# Patient Record
Sex: Male | Born: 1950 | Race: Black or African American | Hispanic: No | Smoking: Former smoker
Health system: Southern US, Community
[De-identification: ages and names within clinical notes are randomized; demographics above are authoritative.]

## PROBLEM LIST (undated history)

## (undated) DIAGNOSIS — E119 Type 2 diabetes mellitus without complications: Secondary | ICD-10-CM

## (undated) DIAGNOSIS — E785 Hyperlipidemia, unspecified: Secondary | ICD-10-CM

## (undated) DIAGNOSIS — I739 Peripheral vascular disease, unspecified: Secondary | ICD-10-CM

## (undated) DIAGNOSIS — R51 Headache: Secondary | ICD-10-CM

## (undated) DIAGNOSIS — K219 Gastro-esophageal reflux disease without esophagitis: Secondary | ICD-10-CM

## (undated) DIAGNOSIS — I10 Essential (primary) hypertension: Secondary | ICD-10-CM

## (undated) DIAGNOSIS — R519 Headache, unspecified: Secondary | ICD-10-CM

## (undated) DIAGNOSIS — M1990 Unspecified osteoarthritis, unspecified site: Secondary | ICD-10-CM

## (undated) HISTORY — DX: Essential (primary) hypertension: I10

## (undated) HISTORY — DX: Hyperlipidemia, unspecified: E78.5

## (undated) HISTORY — PX: BACK SURGERY: SHX140

## (undated) HISTORY — PX: KNEE ARTHROSCOPY: SUR90

---
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
## 1995-09-07 HISTORY — PX: COLONOSCOPY: SHX5424

## 1999-09-07 HISTORY — PX: NECK SURGERY: SHX720

## 2000-02-10 ENCOUNTER — Ambulatory Visit (HOSPITAL_COMMUNITY): Admission: RE | Admit: 2000-02-10 | Discharge: 2000-02-10 | Payer: Self-pay | Admitting: Neurological Surgery

## 2000-02-10 ENCOUNTER — Encounter: Payer: Self-pay | Admitting: Neurological Surgery

## 2000-05-20 ENCOUNTER — Encounter: Payer: Self-pay | Admitting: Neurological Surgery

## 2000-05-24 ENCOUNTER — Encounter: Payer: Self-pay | Admitting: Neurological Surgery

## 2000-05-24 ENCOUNTER — Inpatient Hospital Stay (HOSPITAL_COMMUNITY): Admission: RE | Admit: 2000-05-24 | Discharge: 2000-05-26 | Payer: Self-pay | Admitting: Neurological Surgery

## 2000-06-08 ENCOUNTER — Encounter: Payer: Self-pay | Admitting: *Deleted

## 2000-06-08 ENCOUNTER — Emergency Department (HOSPITAL_COMMUNITY): Admission: EM | Admit: 2000-06-08 | Discharge: 2000-06-08 | Payer: Self-pay | Admitting: Emergency Medicine

## 2000-07-08 ENCOUNTER — Encounter: Payer: Self-pay | Admitting: Neurological Surgery

## 2000-07-08 ENCOUNTER — Encounter: Admission: RE | Admit: 2000-07-08 | Discharge: 2000-07-08 | Payer: Self-pay | Admitting: Neurological Surgery

## 2000-11-25 ENCOUNTER — Encounter: Admission: RE | Admit: 2000-11-25 | Discharge: 2000-11-25 | Payer: Self-pay | Admitting: Neurological Surgery

## 2000-11-25 ENCOUNTER — Encounter: Payer: Self-pay | Admitting: Neurological Surgery

## 2001-02-17 ENCOUNTER — Encounter: Payer: Self-pay | Admitting: Neurological Surgery

## 2001-02-17 ENCOUNTER — Ambulatory Visit (HOSPITAL_COMMUNITY): Admission: RE | Admit: 2001-02-17 | Discharge: 2001-02-17 | Payer: Self-pay | Admitting: Neurological Surgery

## 2004-06-11 ENCOUNTER — Ambulatory Visit (HOSPITAL_COMMUNITY): Admission: RE | Admit: 2004-06-11 | Discharge: 2004-06-11 | Payer: Self-pay | Admitting: Neurological Surgery

## 2007-04-12 ENCOUNTER — Encounter: Admission: RE | Admit: 2007-04-12 | Discharge: 2007-04-12 | Payer: Self-pay | Admitting: Neurological Surgery

## 2011-01-22 NOTE — Op Note (Signed)
[place]. [facility]  Patient:    [name] [name], [name] [name]                         MRN: [id number] Proc. Date: 05/24/00 Adm. 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  POSTOPERATIVE DIAGNOSIS:  Cervical spondylosis with myelopathy, C3 to T1.  OPERATION PERFORMED:  Cervical laminoplasty, C3, C4, C5, C6, C6 and T1. Arthrodesis with allograft and Osteomed plate fixation C3, C4, C5, C6, C7 and T1.  SURGEON:  Stefani Dama, M.D.  ASSISTANT:   Mena Goes. Franky Macho, M.D.  ANESTHESIA:  General endotracheal.  INDICATIONS FOR PROCEDURE:  The patient is a 60 year old individual who has had significant complaints of neck, shoulder and arm pain with progressive weakness, dysesthesias in the lower extremity.  He has a previous known history of lumbar stenosis.  He was also found to have cervical stenosis and spondylosis and after following him for a numer of years with progressive but insidious worsening, he was advised regarding surgical decompression via cervical laminoplasty.  DESCRIPTION OF PROCEDURE:  The patient was brought to the operating room supine on the stretcher. After smooth induction of general endotracheal anesthesia, he was placed in a three-pin head rest, turned prone and stabilized on the table with appropriate protection to the bony prominences. The back of the neck was then shaved, prepped with DuraPrep and then draped in sterile fashion.  A midline incision was then created and carried out from the base of the occiput down to the T1 region.  The dissection was carried down through the cervical dorsal fascia and the fascia was then opened and separated so as to allow division of the paraspinous musculature from the spinous processes of C3 all the way down to the T1 vertebra.  The lateral gutters were packed and there was noted to be  significant vascularity of the musculature in either lateral gutter thus causing some blood loss.  The dissection was carried out to the facet joints which were easily identified. Then using a Midas Rex and an A-2 bur, a cut was made through the lateral border of the lamina on the right side down to the inner table of the bone but not through it.  This was done quite straightforwardly at T1, C7, C6, and C5; however, at C4 and C3 the lamina was so thin that it was cut through.  On the right side then, a similar cut was made through both cortices of the bone. this allowed for elevation of one side of the laminar arch thus forming the trap opening and relieving the spinal canal compromise.  At the C3 and the C4 levels, the lamina was elevated completely.  A patellar allograft was then cut into appropriate pieces measuring approximately 3 mm in width, 4 mm in heighit and 10 mm in length.  These pieces of allograft were then fashioned and fitted into the laminar opening on the left hand side.  Each graft required specific tailoring to fit so that would open the lamina in adequate amount.  At C3 and C4, bilateral grafts were placed, so as to lift the lamina completely dorsally, allowing good decompression of the central canal.  Then 6-hole Osteomed plates were placed on each of the laminar  bridges with the graft sandwiched in between.  These were secured with four 4 mm screws in each plate.  This was done on the left side at C3, C4, C5, C6, C7 and T1 and then on the right side at C3 and C4 using only two holed Osteomed plates.  Two 5 mm screws were placed in some of the thicker portions of bone at C7 and T1.  The total number of plates used was six 6-hole Osteomed plates, two 2-hole Osteomed plates, 26 4mm screws and two 5mm screws. Once the construct was completed it was checked for security at each of the individual cervical levels.  This was felt to be stable and intact.  The area was  copiously irrigated with antibiotic irrigating solution.  Then the cervical dorsal fascia was reapproximated and closed with #1 Vicryl in interrupted fashion. 2-0 Vicryl was used in the subcutaneous and subcuticular tissues.  Surgical staples were used in the skin.  A dry sterile dressing was applied.  The patient was then turned to the supine position.  He was extubated in the operating room and returned to the recovery room for further postoperative care.  ESTIMATED BLOOD LOSS:  700 cc. DD:  05/24/00 TD:  05/25/00 Job: [id number]

## 2013-02-07 ENCOUNTER — Other Ambulatory Visit: Payer: Self-pay | Admitting: Neurological Surgery

## 2013-02-07 DIAGNOSIS — M48061 Spinal stenosis, lumbar region without neurogenic claudication: Secondary | ICD-10-CM

## 2013-02-14 ENCOUNTER — Ambulatory Visit
Admission: RE | Admit: 2013-02-14 | Discharge: 2013-02-14 | Disposition: A | Discharge status: Home / Self Care | Payer: No Typology Code available for payment source | Source: Ambulatory Visit | Attending: Neurological Surgery | Admitting: Neurological Surgery

## 2013-02-14 DIAGNOSIS — M48061 Spinal stenosis, lumbar region without neurogenic claudication: Secondary | ICD-10-CM

## 2013-02-14 MED ORDER — GADOBENATE DIMEGLUMINE 529 MG/ML IV SOLN
20.0000 mL | Freq: Once | INTRAVENOUS | Status: AC | PRN
  Administered 2013-02-14: 20 mL via INTRAVENOUS

## 2013-04-16 ENCOUNTER — Other Ambulatory Visit: Payer: Self-pay | Admitting: Neurological Surgery

## 2013-04-16 DIAGNOSIS — IMO0002 Reserved for concepts with insufficient information to code with codable children: Secondary | ICD-10-CM

## 2013-04-20 ENCOUNTER — Ambulatory Visit
Admission: RE | Admit: 2013-04-20 | Discharge: 2013-04-20 | Disposition: A | Discharge status: Home / Self Care | Payer: PRIVATE HEALTH INSURANCE | Source: Ambulatory Visit | Attending: Neurological Surgery | Admitting: Neurological Surgery

## 2013-04-20 DIAGNOSIS — IMO0002 Reserved for concepts with insufficient information to code with codable children: Secondary | ICD-10-CM

## 2013-04-22 ENCOUNTER — Other Ambulatory Visit: Payer: Self-pay | Admitting: Neurological Surgery

## 2013-04-22 ENCOUNTER — Ambulatory Visit
Admission: RE | Admit: 2013-04-22 | Discharge: 2013-04-22 | Disposition: A | Discharge status: Home / Self Care | Payer: PRIVATE HEALTH INSURANCE | Source: Ambulatory Visit | Attending: Neurological Surgery | Admitting: Neurological Surgery

## 2013-04-22 DIAGNOSIS — IMO0002 Reserved for concepts with insufficient information to code with codable children: Secondary | ICD-10-CM

## 2016-09-14 DIAGNOSIS — M75112 Incomplete rotator cuff tear or rupture of left shoulder, not specified as traumatic: Secondary | ICD-10-CM | POA: Diagnosis not present

## 2016-09-21 DIAGNOSIS — M948X1 Other specified disorders of cartilage, shoulder: Secondary | ICD-10-CM | POA: Diagnosis not present

## 2016-09-21 DIAGNOSIS — M75112 Incomplete rotator cuff tear or rupture of left shoulder, not specified as traumatic: Secondary | ICD-10-CM | POA: Diagnosis not present

## 2016-09-21 DIAGNOSIS — M85612 Other cyst of bone, left shoulder: Secondary | ICD-10-CM | POA: Diagnosis not present

## 2016-09-21 DIAGNOSIS — S46011A Strain of muscle(s) and tendon(s) of the rotator cuff of right shoulder, initial encounter: Secondary | ICD-10-CM | POA: Diagnosis not present

## 2016-09-21 DIAGNOSIS — R600 Localized edema: Secondary | ICD-10-CM | POA: Diagnosis not present

## 2016-09-21 DIAGNOSIS — M19012 Primary osteoarthritis, left shoulder: Secondary | ICD-10-CM | POA: Diagnosis not present

## 2016-10-11 DIAGNOSIS — S4382XS Sprain of other specified parts of left shoulder girdle, sequela: Secondary | ICD-10-CM | POA: Diagnosis not present

## 2016-10-11 DIAGNOSIS — M19012 Primary osteoarthritis, left shoulder: Secondary | ICD-10-CM | POA: Diagnosis not present

## 2016-10-11 DIAGNOSIS — M75112 Incomplete rotator cuff tear or rupture of left shoulder, not specified as traumatic: Secondary | ICD-10-CM | POA: Diagnosis not present

## 2016-10-13 DIAGNOSIS — L110 Acquired keratosis follicularis: Secondary | ICD-10-CM | POA: Diagnosis not present

## 2016-10-13 DIAGNOSIS — B351 Tinea unguium: Secondary | ICD-10-CM | POA: Diagnosis not present

## 2016-10-13 DIAGNOSIS — E1140 Type 2 diabetes mellitus with diabetic neuropathy, unspecified: Secondary | ICD-10-CM | POA: Diagnosis not present

## 2016-12-10 DIAGNOSIS — Z299 Encounter for prophylactic measures, unspecified: Secondary | ICD-10-CM | POA: Diagnosis not present

## 2016-12-10 DIAGNOSIS — Z6832 Body mass index (BMI) 32.0-32.9, adult: Secondary | ICD-10-CM | POA: Diagnosis not present

## 2016-12-10 DIAGNOSIS — R69 Illness, unspecified: Secondary | ICD-10-CM | POA: Diagnosis not present

## 2016-12-10 DIAGNOSIS — E119 Type 2 diabetes mellitus without complications: Secondary | ICD-10-CM | POA: Diagnosis not present

## 2016-12-10 DIAGNOSIS — Z713 Dietary counseling and surveillance: Secondary | ICD-10-CM | POA: Diagnosis not present

## 2017-01-20 DIAGNOSIS — B351 Tinea unguium: Secondary | ICD-10-CM | POA: Diagnosis not present

## 2017-01-20 DIAGNOSIS — L110 Acquired keratosis follicularis: Secondary | ICD-10-CM | POA: Diagnosis not present

## 2017-01-20 DIAGNOSIS — E1140 Type 2 diabetes mellitus with diabetic neuropathy, unspecified: Secondary | ICD-10-CM | POA: Diagnosis not present

## 2017-01-20 DIAGNOSIS — M79671 Pain in right foot: Secondary | ICD-10-CM | POA: Diagnosis not present

## 2017-02-09 DIAGNOSIS — L280 Lichen simplex chronicus: Secondary | ICD-10-CM | POA: Diagnosis not present

## 2017-02-09 DIAGNOSIS — L0100 Impetigo, unspecified: Secondary | ICD-10-CM | POA: Diagnosis not present

## 2017-02-09 DIAGNOSIS — L570 Actinic keratosis: Secondary | ICD-10-CM | POA: Diagnosis not present

## 2017-02-09 DIAGNOSIS — D485 Neoplasm of uncertain behavior of skin: Secondary | ICD-10-CM | POA: Diagnosis not present

## 2017-03-10 DIAGNOSIS — L280 Lichen simplex chronicus: Secondary | ICD-10-CM | POA: Diagnosis not present

## 2017-05-12 DIAGNOSIS — M79671 Pain in right foot: Secondary | ICD-10-CM | POA: Diagnosis not present

## 2017-05-12 DIAGNOSIS — L110 Acquired keratosis follicularis: Secondary | ICD-10-CM | POA: Diagnosis not present

## 2017-05-12 DIAGNOSIS — B351 Tinea unguium: Secondary | ICD-10-CM | POA: Diagnosis not present

## 2017-05-12 DIAGNOSIS — E1140 Type 2 diabetes mellitus with diabetic neuropathy, unspecified: Secondary | ICD-10-CM | POA: Diagnosis not present

## 2017-05-17 DIAGNOSIS — R51 Headache: Secondary | ICD-10-CM | POA: Diagnosis not present

## 2017-05-17 DIAGNOSIS — S161XXA Strain of muscle, fascia and tendon at neck level, initial encounter: Secondary | ICD-10-CM | POA: Diagnosis not present

## 2017-05-17 DIAGNOSIS — M545 Low back pain: Secondary | ICD-10-CM | POA: Diagnosis not present

## 2017-05-17 DIAGNOSIS — R69 Illness, unspecified: Secondary | ICD-10-CM | POA: Diagnosis not present

## 2017-05-18 DIAGNOSIS — M5412 Radiculopathy, cervical region: Secondary | ICD-10-CM | POA: Diagnosis not present

## 2017-05-18 DIAGNOSIS — S161XXA Strain of muscle, fascia and tendon at neck level, initial encounter: Secondary | ICD-10-CM | POA: Diagnosis not present

## 2017-05-18 DIAGNOSIS — M545 Low back pain: Secondary | ICD-10-CM | POA: Diagnosis not present

## 2017-05-19 DIAGNOSIS — M25512 Pain in left shoulder: Secondary | ICD-10-CM | POA: Diagnosis not present

## 2017-05-19 DIAGNOSIS — M47812 Spondylosis without myelopathy or radiculopathy, cervical region: Secondary | ICD-10-CM | POA: Diagnosis not present

## 2017-05-19 DIAGNOSIS — M19012 Primary osteoarthritis, left shoulder: Secondary | ICD-10-CM | POA: Diagnosis not present

## 2017-05-19 DIAGNOSIS — M5136 Other intervertebral disc degeneration, lumbar region: Secondary | ICD-10-CM | POA: Diagnosis not present

## 2017-05-19 DIAGNOSIS — M542 Cervicalgia: Secondary | ICD-10-CM | POA: Diagnosis not present

## 2017-05-19 DIAGNOSIS — M48061 Spinal stenosis, lumbar region without neurogenic claudication: Secondary | ICD-10-CM | POA: Diagnosis not present

## 2017-05-19 DIAGNOSIS — M4802 Spinal stenosis, cervical region: Secondary | ICD-10-CM | POA: Diagnosis not present

## 2017-05-19 DIAGNOSIS — M545 Low back pain: Secondary | ICD-10-CM | POA: Diagnosis not present

## 2017-05-19 DIAGNOSIS — S199XXA Unspecified injury of neck, initial encounter: Secondary | ICD-10-CM | POA: Diagnosis not present

## 2017-05-19 DIAGNOSIS — I6523 Occlusion and stenosis of bilateral carotid arteries: Secondary | ICD-10-CM | POA: Diagnosis not present

## 2017-05-19 DIAGNOSIS — Z981 Arthrodesis status: Secondary | ICD-10-CM | POA: Diagnosis not present

## 2017-05-19 DIAGNOSIS — M47816 Spondylosis without myelopathy or radiculopathy, lumbar region: Secondary | ICD-10-CM | POA: Diagnosis not present

## 2017-05-24 DIAGNOSIS — Z6833 Body mass index (BMI) 33.0-33.9, adult: Secondary | ICD-10-CM | POA: Diagnosis not present

## 2017-05-24 DIAGNOSIS — Z299 Encounter for prophylactic measures, unspecified: Secondary | ICD-10-CM | POA: Diagnosis not present

## 2017-05-24 DIAGNOSIS — R6884 Jaw pain: Secondary | ICD-10-CM | POA: Diagnosis not present

## 2017-05-24 DIAGNOSIS — S161XXA Strain of muscle, fascia and tendon at neck level, initial encounter: Secondary | ICD-10-CM | POA: Diagnosis not present

## 2017-06-08 DIAGNOSIS — M545 Low back pain: Secondary | ICD-10-CM | POA: Diagnosis not present

## 2017-06-08 DIAGNOSIS — Z299 Encounter for prophylactic measures, unspecified: Secondary | ICD-10-CM | POA: Diagnosis not present

## 2017-06-08 DIAGNOSIS — M5412 Radiculopathy, cervical region: Secondary | ICD-10-CM | POA: Diagnosis not present

## 2017-06-08 DIAGNOSIS — R6884 Jaw pain: Secondary | ICD-10-CM | POA: Diagnosis not present

## 2017-06-08 DIAGNOSIS — Z6832 Body mass index (BMI) 32.0-32.9, adult: Secondary | ICD-10-CM | POA: Diagnosis not present

## 2017-06-15 DIAGNOSIS — M545 Low back pain: Secondary | ICD-10-CM | POA: Diagnosis not present

## 2017-06-15 DIAGNOSIS — M542 Cervicalgia: Secondary | ICD-10-CM | POA: Diagnosis not present

## 2017-06-16 DIAGNOSIS — M545 Low back pain: Secondary | ICD-10-CM | POA: Diagnosis not present

## 2017-06-16 DIAGNOSIS — M542 Cervicalgia: Secondary | ICD-10-CM | POA: Diagnosis not present

## 2017-06-20 DIAGNOSIS — M545 Low back pain: Secondary | ICD-10-CM | POA: Diagnosis not present

## 2017-06-20 DIAGNOSIS — M542 Cervicalgia: Secondary | ICD-10-CM | POA: Diagnosis not present

## 2017-06-21 DIAGNOSIS — L404 Guttate psoriasis: Secondary | ICD-10-CM | POA: Diagnosis not present

## 2017-06-21 DIAGNOSIS — L439 Lichen planus, unspecified: Secondary | ICD-10-CM | POA: Diagnosis not present

## 2017-06-21 DIAGNOSIS — L280 Lichen simplex chronicus: Secondary | ICD-10-CM | POA: Diagnosis not present

## 2017-06-21 DIAGNOSIS — D485 Neoplasm of uncertain behavior of skin: Secondary | ICD-10-CM | POA: Diagnosis not present

## 2017-06-22 DIAGNOSIS — M545 Low back pain: Secondary | ICD-10-CM | POA: Diagnosis not present

## 2017-06-22 DIAGNOSIS — M542 Cervicalgia: Secondary | ICD-10-CM | POA: Diagnosis not present

## 2017-06-23 DIAGNOSIS — M545 Low back pain: Secondary | ICD-10-CM | POA: Diagnosis not present

## 2017-06-23 DIAGNOSIS — M542 Cervicalgia: Secondary | ICD-10-CM | POA: Diagnosis not present

## 2017-06-27 DIAGNOSIS — M545 Low back pain: Secondary | ICD-10-CM | POA: Diagnosis not present

## 2017-06-27 DIAGNOSIS — M542 Cervicalgia: Secondary | ICD-10-CM | POA: Diagnosis not present

## 2017-06-29 DIAGNOSIS — M545 Low back pain: Secondary | ICD-10-CM | POA: Diagnosis not present

## 2017-06-29 DIAGNOSIS — M542 Cervicalgia: Secondary | ICD-10-CM | POA: Diagnosis not present

## 2017-07-01 DIAGNOSIS — M545 Low back pain: Secondary | ICD-10-CM | POA: Diagnosis not present

## 2017-07-01 DIAGNOSIS — M542 Cervicalgia: Secondary | ICD-10-CM | POA: Diagnosis not present

## 2017-07-04 DIAGNOSIS — M545 Low back pain: Secondary | ICD-10-CM | POA: Diagnosis not present

## 2017-07-04 DIAGNOSIS — M542 Cervicalgia: Secondary | ICD-10-CM | POA: Diagnosis not present

## 2017-07-05 DIAGNOSIS — M5412 Radiculopathy, cervical region: Secondary | ICD-10-CM | POA: Diagnosis not present

## 2017-07-05 DIAGNOSIS — R29898 Other symptoms and signs involving the musculoskeletal system: Secondary | ICD-10-CM | POA: Diagnosis not present

## 2017-07-05 DIAGNOSIS — S161XXA Strain of muscle, fascia and tendon at neck level, initial encounter: Secondary | ICD-10-CM | POA: Diagnosis not present

## 2017-07-06 DIAGNOSIS — M545 Low back pain: Secondary | ICD-10-CM | POA: Diagnosis not present

## 2017-07-06 DIAGNOSIS — M542 Cervicalgia: Secondary | ICD-10-CM | POA: Diagnosis not present

## 2017-07-08 DIAGNOSIS — M545 Low back pain: Secondary | ICD-10-CM | POA: Diagnosis not present

## 2017-07-08 DIAGNOSIS — M542 Cervicalgia: Secondary | ICD-10-CM | POA: Diagnosis not present

## 2017-07-11 DIAGNOSIS — M542 Cervicalgia: Secondary | ICD-10-CM | POA: Diagnosis not present

## 2017-07-11 DIAGNOSIS — M545 Low back pain: Secondary | ICD-10-CM | POA: Diagnosis not present

## 2017-07-13 DIAGNOSIS — M545 Low back pain: Secondary | ICD-10-CM | POA: Diagnosis not present

## 2017-07-13 DIAGNOSIS — M542 Cervicalgia: Secondary | ICD-10-CM | POA: Diagnosis not present

## 2017-07-15 DIAGNOSIS — M545 Low back pain: Secondary | ICD-10-CM | POA: Diagnosis not present

## 2017-07-15 DIAGNOSIS — M542 Cervicalgia: Secondary | ICD-10-CM | POA: Diagnosis not present

## 2017-07-18 DIAGNOSIS — M545 Low back pain: Secondary | ICD-10-CM | POA: Diagnosis not present

## 2017-07-18 DIAGNOSIS — M542 Cervicalgia: Secondary | ICD-10-CM | POA: Diagnosis not present

## 2017-07-20 DIAGNOSIS — M542 Cervicalgia: Secondary | ICD-10-CM | POA: Diagnosis not present

## 2017-07-20 DIAGNOSIS — M545 Low back pain: Secondary | ICD-10-CM | POA: Diagnosis not present

## 2017-07-22 DIAGNOSIS — M542 Cervicalgia: Secondary | ICD-10-CM | POA: Diagnosis not present

## 2017-07-22 DIAGNOSIS — M545 Low back pain: Secondary | ICD-10-CM | POA: Diagnosis not present

## 2017-07-27 DIAGNOSIS — M545 Low back pain: Secondary | ICD-10-CM | POA: Diagnosis not present

## 2017-07-27 DIAGNOSIS — M542 Cervicalgia: Secondary | ICD-10-CM | POA: Diagnosis not present

## 2017-07-29 DIAGNOSIS — M545 Low back pain: Secondary | ICD-10-CM | POA: Diagnosis not present

## 2017-07-29 DIAGNOSIS — M542 Cervicalgia: Secondary | ICD-10-CM | POA: Diagnosis not present

## 2017-08-11 DIAGNOSIS — B351 Tinea unguium: Secondary | ICD-10-CM | POA: Diagnosis not present

## 2017-08-11 DIAGNOSIS — E1140 Type 2 diabetes mellitus with diabetic neuropathy, unspecified: Secondary | ICD-10-CM | POA: Diagnosis not present

## 2017-08-11 DIAGNOSIS — M79671 Pain in right foot: Secondary | ICD-10-CM | POA: Diagnosis not present

## 2017-08-11 DIAGNOSIS — L110 Acquired keratosis follicularis: Secondary | ICD-10-CM | POA: Diagnosis not present

## 2017-08-16 DIAGNOSIS — M545 Low back pain: Secondary | ICD-10-CM | POA: Diagnosis not present

## 2017-08-16 DIAGNOSIS — Z299 Encounter for prophylactic measures, unspecified: Secondary | ICD-10-CM | POA: Diagnosis not present

## 2017-08-16 DIAGNOSIS — M25512 Pain in left shoulder: Secondary | ICD-10-CM | POA: Diagnosis not present

## 2017-08-16 DIAGNOSIS — Z6832 Body mass index (BMI) 32.0-32.9, adult: Secondary | ICD-10-CM | POA: Diagnosis not present

## 2017-09-01 DIAGNOSIS — E1165 Type 2 diabetes mellitus with hyperglycemia: Secondary | ICD-10-CM | POA: Diagnosis not present

## 2017-09-01 DIAGNOSIS — R5383 Other fatigue: Secondary | ICD-10-CM | POA: Diagnosis not present

## 2017-09-01 DIAGNOSIS — Z1339 Encounter for screening examination for other mental health and behavioral disorders: Secondary | ICD-10-CM | POA: Diagnosis not present

## 2017-09-01 DIAGNOSIS — Z2821 Immunization not carried out because of patient refusal: Secondary | ICD-10-CM | POA: Diagnosis not present

## 2017-09-01 DIAGNOSIS — Z79899 Other long term (current) drug therapy: Secondary | ICD-10-CM | POA: Diagnosis not present

## 2017-09-01 DIAGNOSIS — Z7189 Other specified counseling: Secondary | ICD-10-CM | POA: Diagnosis not present

## 2017-09-01 DIAGNOSIS — Z1211 Encounter for screening for malignant neoplasm of colon: Secondary | ICD-10-CM | POA: Diagnosis not present

## 2017-09-01 DIAGNOSIS — Z0000 Encounter for general adult medical examination without abnormal findings: Secondary | ICD-10-CM | POA: Diagnosis not present

## 2017-09-01 DIAGNOSIS — Z125 Encounter for screening for malignant neoplasm of prostate: Secondary | ICD-10-CM | POA: Diagnosis not present

## 2017-09-01 DIAGNOSIS — Z1331 Encounter for screening for depression: Secondary | ICD-10-CM | POA: Diagnosis not present

## 2017-09-12 DIAGNOSIS — M5416 Radiculopathy, lumbar region: Secondary | ICD-10-CM | POA: Diagnosis not present

## 2017-09-12 DIAGNOSIS — M48062 Spinal stenosis, lumbar region with neurogenic claudication: Secondary | ICD-10-CM | POA: Diagnosis not present

## 2017-09-13 DIAGNOSIS — L280 Lichen simplex chronicus: Secondary | ICD-10-CM | POA: Diagnosis not present

## 2017-09-22 DIAGNOSIS — M4807 Spinal stenosis, lumbosacral region: Secondary | ICD-10-CM | POA: Diagnosis not present

## 2017-09-22 DIAGNOSIS — M47817 Spondylosis without myelopathy or radiculopathy, lumbosacral region: Secondary | ICD-10-CM | POA: Diagnosis not present

## 2017-09-22 DIAGNOSIS — M545 Low back pain: Secondary | ICD-10-CM | POA: Diagnosis not present

## 2017-09-22 DIAGNOSIS — M47816 Spondylosis without myelopathy or radiculopathy, lumbar region: Secondary | ICD-10-CM | POA: Diagnosis not present

## 2017-09-22 DIAGNOSIS — Z9889 Other specified postprocedural states: Secondary | ICD-10-CM | POA: Diagnosis not present

## 2017-09-22 DIAGNOSIS — M5416 Radiculopathy, lumbar region: Secondary | ICD-10-CM | POA: Diagnosis not present

## 2017-09-22 DIAGNOSIS — M48061 Spinal stenosis, lumbar region without neurogenic claudication: Secondary | ICD-10-CM | POA: Diagnosis not present

## 2017-10-12 ENCOUNTER — Other Ambulatory Visit: Payer: Self-pay | Admitting: Neurological Surgery

## 2017-10-12 DIAGNOSIS — M47816 Spondylosis without myelopathy or radiculopathy, lumbar region: Secondary | ICD-10-CM | POA: Diagnosis not present

## 2017-10-12 DIAGNOSIS — R030 Elevated blood-pressure reading, without diagnosis of hypertension: Secondary | ICD-10-CM | POA: Diagnosis not present

## 2017-10-12 DIAGNOSIS — Z6832 Body mass index (BMI) 32.0-32.9, adult: Secondary | ICD-10-CM | POA: Diagnosis not present

## 2017-10-12 DIAGNOSIS — M48062 Spinal stenosis, lumbar region with neurogenic claudication: Secondary | ICD-10-CM | POA: Diagnosis not present

## 2017-10-17 NOTE — Progress Notes (Signed)
Several unsuccessful attempts were made to contact pt; lvm with pre-op instructions according to pre-op checklist. Pt made aware to stop taking  Aspirin, vitamins, fish oil and herbal medications. Do not take any NSAIDs ie: Ibuprofen, Advil, Naproxen (Aleve), Motrin, BC and Goody Powder.

## 2017-10-18 ENCOUNTER — Ambulatory Visit (HOSPITAL_COMMUNITY): Payer: Medicare HMO

## 2017-10-18 ENCOUNTER — Ambulatory Visit (HOSPITAL_COMMUNITY): Payer: Medicare HMO | Admitting: Certified Registered Nurse Anesthetist

## 2017-10-18 ENCOUNTER — Other Ambulatory Visit: Payer: Self-pay

## 2017-10-18 ENCOUNTER — Encounter (HOSPITAL_COMMUNITY): Payer: Self-pay | Admitting: Certified Registered Nurse Anesthetist

## 2017-10-18 ENCOUNTER — Observation Stay (HOSPITAL_COMMUNITY)
Admission: RE | Admit: 2017-10-18 | Discharge: 2017-10-19 | Disposition: A | Discharge status: Home Health | Payer: Medicare HMO | Source: Ambulatory Visit | Attending: Neurological Surgery | Admitting: Neurological Surgery

## 2017-10-18 ENCOUNTER — Encounter (HOSPITAL_COMMUNITY)
Admission: RE | Disposition: A | Discharge status: Home Health | Payer: Self-pay | Source: Ambulatory Visit | Attending: Neurological Surgery

## 2017-10-18 DIAGNOSIS — M48061 Spinal stenosis, lumbar region without neurogenic claudication: Secondary | ICD-10-CM | POA: Diagnosis not present

## 2017-10-18 DIAGNOSIS — Z79899 Other long term (current) drug therapy: Secondary | ICD-10-CM | POA: Diagnosis not present

## 2017-10-18 DIAGNOSIS — Z9889 Other specified postprocedural states: Secondary | ICD-10-CM | POA: Diagnosis not present

## 2017-10-18 DIAGNOSIS — M48062 Spinal stenosis, lumbar region with neurogenic claudication: Principal | ICD-10-CM | POA: Diagnosis present

## 2017-10-18 DIAGNOSIS — M4726 Other spondylosis with radiculopathy, lumbar region: Secondary | ICD-10-CM | POA: Insufficient documentation

## 2017-10-18 DIAGNOSIS — I2510 Atherosclerotic heart disease of native coronary artery without angina pectoris: Secondary | ICD-10-CM | POA: Insufficient documentation

## 2017-10-18 DIAGNOSIS — R2689 Other abnormalities of gait and mobility: Secondary | ICD-10-CM | POA: Diagnosis not present

## 2017-10-18 DIAGNOSIS — Z419 Encounter for procedure for purposes other than remedying health state, unspecified: Secondary | ICD-10-CM

## 2017-10-18 HISTORY — DX: Headache: R51

## 2017-10-18 HISTORY — DX: Headache, unspecified: R51.9

## 2017-10-18 HISTORY — PX: LUMBAR LAMINECTOMY/DECOMPRESSION MICRODISCECTOMY: SHX5026

## 2017-10-18 HISTORY — DX: Unspecified osteoarthritis, unspecified site: M19.90

## 2017-10-18 LAB — CBC
HEMATOCRIT: 42.5 % (ref 39.0–52.0)
HEMOGLOBIN: 14.2 g/dL (ref 13.0–17.0)
MCH: 29.2 pg (ref 26.0–34.0)
MCHC: 33.4 g/dL (ref 30.0–36.0)
MCV: 87.3 fL (ref 78.0–100.0)
Platelets: 250 10*3/uL (ref 150–400)
RBC: 4.87 MIL/uL (ref 4.22–5.81)
RDW: 15.3 % (ref 11.5–15.5)
WBC: 6.7 10*3/uL (ref 4.0–10.5)

## 2017-10-18 LAB — BASIC METABOLIC PANEL
Anion gap: 9 (ref 5–15)
BUN: 9 mg/dL (ref 6–20)
CALCIUM: 9.6 mg/dL (ref 8.9–10.3)
CHLORIDE: 106 mmol/L (ref 101–111)
CO2: 25 mmol/L (ref 22–32)
Creatinine, Ser: 0.8 mg/dL (ref 0.61–1.24)
GFR calc non Af Amer: >60 mL/min (ref >60)
GLUCOSE: 139 mg/dL — AB (ref 65–99)
POTASSIUM: 4.1 mmol/L (ref 3.5–5.1)
Sodium: 140 mmol/L (ref 135–145)

## 2017-10-18 SURGERY — LUMBAR LAMINECTOMY/DECOMPRESSION MICRODISCECTOMY 2 LEVELS
Anesthesia: General | Site: Back | Laterality: Bilateral

## 2017-10-18 MED ORDER — SUGAMMADEX SODIUM 200 MG/2ML IV SOLN
INTRAVENOUS | Status: AC
  Filled 2017-10-18: qty 2

## 2017-10-18 MED ORDER — ONDANSETRON HCL 4 MG/2ML IJ SOLN
INTRAMUSCULAR | Status: AC
  Filled 2017-10-18: qty 2

## 2017-10-18 MED ORDER — SODIUM CHLORIDE 0.9% FLUSH: 3.0000 mL | INTRAVENOUS | Status: DC | PRN

## 2017-10-18 MED ORDER — CEFAZOLIN SODIUM-DEXTROSE 2-4 GM/100ML-% IV SOLN
INTRAVENOUS | Status: AC
  Filled 2017-10-18: qty 100

## 2017-10-18 MED ORDER — PHENYLEPHRINE 40 MCG/ML (10ML) SYRINGE FOR IV PUSH (FOR BLOOD PRESSURE SUPPORT)
PREFILLED_SYRINGE | INTRAVENOUS | Status: DC | PRN
  Administered 2017-10-18 (×4): 80 ug via INTRAVENOUS

## 2017-10-18 MED ORDER — SUGAMMADEX SODIUM 200 MG/2ML IV SOLN
INTRAVENOUS | Status: DC | PRN
  Administered 2017-10-18: 200 mg via INTRAVENOUS

## 2017-10-18 MED ORDER — THROMBIN 5000 UNITS EX SOLR
CUTANEOUS | Status: AC
  Filled 2017-10-18: qty 15000

## 2017-10-18 MED ORDER — DOCUSATE SODIUM 100 MG PO CAPS
100.0000 mg | ORAL_CAPSULE | Freq: Two times a day (BID) | ORAL | Status: DC
  Administered 2017-10-18 – 2017-10-19 (×2): 100 mg via ORAL
  Filled 2017-10-18 (×2): qty 1

## 2017-10-18 MED ORDER — METHOCARBAMOL 1000 MG/10ML IJ SOLN
500.0000 mg | Freq: Four times a day (QID) | INTRAMUSCULAR | Status: DC | PRN
  Filled 2017-10-18: qty 5

## 2017-10-18 MED ORDER — ROCURONIUM BROMIDE 10 MG/ML (PF) SYRINGE
PREFILLED_SYRINGE | INTRAVENOUS | Status: AC
  Filled 2017-10-18: qty 5

## 2017-10-18 MED ORDER — THROMBIN (RECOMBINANT) 5000 UNITS EX SOLR
CUTANEOUS | Status: DC | PRN
  Administered 2017-10-18: 10000 [IU] via TOPICAL

## 2017-10-18 MED ORDER — CEFAZOLIN SODIUM-DEXTROSE 2-4 GM/100ML-% IV SOLN
2.0000 g | INTRAVENOUS | Status: AC
  Administered 2017-10-18: 2 g via INTRAVENOUS

## 2017-10-18 MED ORDER — THROMBIN (RECOMBINANT) 5000 UNITS EX SOLR
CUTANEOUS | Status: DC | PRN
  Administered 2017-10-18: 13:00:00 via TOPICAL

## 2017-10-18 MED ORDER — ONDANSETRON HCL 4 MG PO TABS: 4.0000 mg | ORAL_TABLET | Freq: Four times a day (QID) | ORAL | Status: DC | PRN

## 2017-10-18 MED ORDER — HEMOSTATIC AGENTS (NO CHARGE) OPTIME
TOPICAL | Status: DC | PRN
Start: 2017-10-18 — End: 2017-10-18
  Administered 2017-10-18: 1 via TOPICAL

## 2017-10-18 MED ORDER — MENTHOL 3 MG MT LOZG: 1.0000 | LOZENGE | OROMUCOSAL | Status: DC | PRN

## 2017-10-18 MED ORDER — 0.9 % SODIUM CHLORIDE (POUR BTL) OPTIME
TOPICAL | Status: DC | PRN
  Administered 2017-10-18: 1000 mL

## 2017-10-18 MED ORDER — HYDROCODONE-ACETAMINOPHEN 5-325 MG PO TABS
2.0000 | ORAL_TABLET | ORAL | Status: DC | PRN
  Administered 2017-10-18 – 2017-10-19 (×4): 2 via ORAL
  Filled 2017-10-18 (×4): qty 2

## 2017-10-18 MED ORDER — LACTATED RINGERS IV SOLN
INTRAVENOUS | Status: DC
  Administered 2017-10-18 (×2): via INTRAVENOUS

## 2017-10-18 MED ORDER — MORPHINE SULFATE (PF) 4 MG/ML IV SOLN: 2.0000 mg | INTRAVENOUS | Status: DC | PRN

## 2017-10-18 MED ORDER — OXYCODONE HCL 5 MG PO TABS: 5.0000 mg | ORAL_TABLET | Freq: Once | ORAL | Status: DC | PRN

## 2017-10-18 MED ORDER — FENTANYL CITRATE (PF) 100 MCG/2ML IJ SOLN
INTRAMUSCULAR | Status: AC
  Filled 2017-10-18: qty 2

## 2017-10-18 MED ORDER — MIDAZOLAM HCL 5 MG/5ML IJ SOLN
INTRAMUSCULAR | Status: DC | PRN
  Administered 2017-10-18: 2 mg via INTRAVENOUS

## 2017-10-18 MED ORDER — BISACODYL 10 MG RE SUPP
10.0000 mg | Freq: Every day | RECTAL | Status: DC | PRN
Start: 2017-10-18 — End: 2017-10-19

## 2017-10-18 MED ORDER — LIDOCAINE 2% (20 MG/ML) 5 ML SYRINGE
INTRAMUSCULAR | Status: AC
  Filled 2017-10-18: qty 5

## 2017-10-18 MED ORDER — ONDANSETRON HCL 4 MG/2ML IJ SOLN: 4.0000 mg | Freq: Four times a day (QID) | INTRAMUSCULAR | Status: DC | PRN

## 2017-10-18 MED ORDER — POLYETHYLENE GLYCOL 3350 17 G PO PACK: 17.0000 g | PACK | Freq: Every day | ORAL | Status: DC | PRN

## 2017-10-18 MED ORDER — CHLORHEXIDINE GLUCONATE CLOTH 2 % EX PADS: 6.0000 | MEDICATED_PAD | Freq: Once | CUTANEOUS | Status: DC

## 2017-10-18 MED ORDER — ACETAMINOPHEN 650 MG RE SUPP: 650.0000 mg | RECTAL | Status: DC | PRN

## 2017-10-18 MED ORDER — DEXAMETHASONE SODIUM PHOSPHATE 10 MG/ML IJ SOLN
INTRAMUSCULAR | Status: AC
  Filled 2017-10-18: qty 1

## 2017-10-18 MED ORDER — BUPIVACAINE HCL (PF) 0.5 % IJ SOLN
INTRAMUSCULAR | Status: DC | PRN
  Administered 2017-10-18: 5 mL
  Administered 2017-10-18: 20 mL

## 2017-10-18 MED ORDER — ONDANSETRON HCL 4 MG/2ML IJ SOLN
INTRAMUSCULAR | Status: DC | PRN
  Administered 2017-10-18: 4 mg via INTRAVENOUS

## 2017-10-18 MED ORDER — ACETAMINOPHEN 325 MG PO TABS: 325.0000 mg | ORAL_TABLET | ORAL | Status: DC | PRN

## 2017-10-18 MED ORDER — FLEET ENEMA 7-19 GM/118ML RE ENEM: 1.0000 | ENEMA | Freq: Once | RECTAL | Status: DC | PRN

## 2017-10-18 MED ORDER — SENNA 8.6 MG PO TABS
1.0000 | ORAL_TABLET | Freq: Two times a day (BID) | ORAL | Status: DC
  Administered 2017-10-18 – 2017-10-19 (×2): 8.6 mg via ORAL
  Filled 2017-10-18 (×2): qty 1

## 2017-10-18 MED ORDER — SODIUM CHLORIDE 0.9 % IV SOLN: 250.0000 mL | INTRAVENOUS | Status: DC

## 2017-10-18 MED ORDER — ROCURONIUM BROMIDE 10 MG/ML (PF) SYRINGE
PREFILLED_SYRINGE | INTRAVENOUS | Status: DC | PRN
  Administered 2017-10-18: 50 mg via INTRAVENOUS
  Administered 2017-10-18: 10 mg via INTRAVENOUS

## 2017-10-18 MED ORDER — OXYCODONE HCL 5 MG/5ML PO SOLN: 5.0000 mg | Freq: Once | ORAL | Status: DC | PRN

## 2017-10-18 MED ORDER — MIDAZOLAM HCL 2 MG/2ML IJ SOLN
INTRAMUSCULAR | Status: AC
  Filled 2017-10-18: qty 2

## 2017-10-18 MED ORDER — MEPERIDINE HCL 50 MG/ML IJ SOLN: 6.2500 mg | INTRAMUSCULAR | Status: DC | PRN

## 2017-10-18 MED ORDER — ALUM & MAG HYDROXIDE-SIMETH 200-200-20 MG/5ML PO SUSP: 30.0000 mL | Freq: Four times a day (QID) | ORAL | Status: DC | PRN

## 2017-10-18 MED ORDER — SODIUM CHLORIDE 0.9% FLUSH
3.0000 mL | Freq: Two times a day (BID) | INTRAVENOUS | Status: DC
  Administered 2017-10-19: 3 mL via INTRAVENOUS

## 2017-10-18 MED ORDER — BUPIVACAINE HCL (PF) 0.5 % IJ SOLN
INTRAMUSCULAR | Status: AC
  Filled 2017-10-18: qty 30

## 2017-10-18 MED ORDER — LIDOCAINE-EPINEPHRINE 1 %-1:100000 IJ SOLN
INTRAMUSCULAR | Status: DC | PRN
  Administered 2017-10-18: 5 mL

## 2017-10-18 MED ORDER — CEFAZOLIN SODIUM-DEXTROSE 2-4 GM/100ML-% IV SOLN
2.0000 g | Freq: Three times a day (TID) | INTRAVENOUS | Status: AC
  Administered 2017-10-18 – 2017-10-19 (×2): 2 g via INTRAVENOUS
  Filled 2017-10-18 (×2): qty 100

## 2017-10-18 MED ORDER — DEXAMETHASONE SODIUM PHOSPHATE 10 MG/ML IJ SOLN
INTRAMUSCULAR | Status: DC | PRN
Start: 2017-10-18 — End: 2017-10-18
  Administered 2017-10-18: 10 mg via INTRAVENOUS

## 2017-10-18 MED ORDER — FENTANYL CITRATE (PF) 250 MCG/5ML IJ SOLN
INTRAMUSCULAR | Status: AC
  Filled 2017-10-18: qty 5

## 2017-10-18 MED ORDER — PROPOFOL 10 MG/ML IV BOLUS
INTRAVENOUS | Status: DC | PRN
  Administered 2017-10-18: 150 mg via INTRAVENOUS

## 2017-10-18 MED ORDER — FENTANYL CITRATE (PF) 100 MCG/2ML IJ SOLN
INTRAMUSCULAR | Status: DC | PRN
  Administered 2017-10-18: 50 ug via INTRAVENOUS
  Administered 2017-10-18: 100 ug via INTRAVENOUS
  Administered 2017-10-18: 50 ug via INTRAVENOUS

## 2017-10-18 MED ORDER — ONDANSETRON HCL 4 MG/2ML IJ SOLN: 4.0000 mg | Freq: Once | INTRAMUSCULAR | Status: DC | PRN

## 2017-10-18 MED ORDER — PHENOL 1.4 % MT LIQD: 1.0000 | OROMUCOSAL | Status: DC | PRN

## 2017-10-18 MED ORDER — ACETAMINOPHEN 325 MG PO TABS: 650.0000 mg | ORAL_TABLET | ORAL | Status: DC | PRN

## 2017-10-18 MED ORDER — KETOROLAC TROMETHAMINE 30 MG/ML IJ SOLN: 30.0000 mg | Freq: Once | INTRAMUSCULAR | Status: DC | PRN

## 2017-10-18 MED ORDER — LIDOCAINE 2% (20 MG/ML) 5 ML SYRINGE
INTRAMUSCULAR | Status: DC | PRN
  Administered 2017-10-18: 80 mg via INTRAVENOUS

## 2017-10-18 MED ORDER — METHOCARBAMOL 500 MG PO TABS: 500.0000 mg | ORAL_TABLET | Freq: Four times a day (QID) | ORAL | Status: DC | PRN

## 2017-10-18 MED ORDER — ACETAMINOPHEN 160 MG/5ML PO SOLN: 325.0000 mg | ORAL | Status: DC | PRN

## 2017-10-18 MED ORDER — FENTANYL CITRATE (PF) 100 MCG/2ML IJ SOLN
25.0000 ug | INTRAMUSCULAR | Status: DC | PRN
  Administered 2017-10-18: 50 ug via INTRAVENOUS

## 2017-10-18 MED ORDER — LIDOCAINE-EPINEPHRINE 1 %-1:100000 IJ SOLN
INTRAMUSCULAR | Status: AC
  Filled 2017-10-18: qty 1

## 2017-10-18 MED ORDER — PROPOFOL 10 MG/ML IV BOLUS
INTRAVENOUS | Status: AC
  Filled 2017-10-18: qty 20

## 2017-10-18 MED ORDER — HYDROCODONE-ACETAMINOPHEN 5-325 MG PO TABS: 1.0000 | ORAL_TABLET | ORAL | Status: DC | PRN

## 2017-10-18 SURGICAL SUPPLY — 49 items
ADH SKN CLS APL DERMABOND .7 (GAUZE/BANDAGES/DRESSINGS) ×1
BAG DECANTER FOR FLEXI CONT (MISCELLANEOUS) ×3 IMPLANT
BLADE CLIPPER SURG (BLADE) ×2 IMPLANT
BUR ACORN 6.0 (BURR) ×1 IMPLANT
BUR ACORN 6.0MM (BURR) ×1
BUR MATCHSTICK NEURO 3.0 LAGG (BURR) ×3 IMPLANT
CANISTER SUCT 3000ML PPV (MISCELLANEOUS) ×3 IMPLANT
CARTRIDGE OIL MAESTRO DRILL (MISCELLANEOUS) ×1 IMPLANT
DECANTER SPIKE VIAL GLASS SM (MISCELLANEOUS) ×3 IMPLANT
DERMABOND ADVANCED (GAUZE/BANDAGES/DRESSINGS) ×2
DERMABOND ADVANCED .7 DNX12 (GAUZE/BANDAGES/DRESSINGS) ×1 IMPLANT
DIFFUSER DRILL AIR PNEUMATIC (MISCELLANEOUS) ×1 IMPLANT
DRAPE HALF SHEET 40X57 (DRAPES) IMPLANT
DRAPE LAPAROTOMY 100X72X124 (DRAPES) ×3 IMPLANT
DRAPE MICROSCOPE LEICA (MISCELLANEOUS) ×2 IMPLANT
DRAPE POUCH INSTRU U-SHP 10X18 (DRAPES) ×1 IMPLANT
DRSG OPSITE POSTOP 4X6 (GAUZE/BANDAGES/DRESSINGS) ×2 IMPLANT
DURAPREP 26ML APPLICATOR (WOUND CARE) ×3 IMPLANT
ELECT REM PT RETURN 9FT ADLT (ELECTROSURGICAL) ×3
ELECTRODE REM PT RTRN 9FT ADLT (ELECTROSURGICAL) ×1 IMPLANT
GAUZE SPONGE 4X4 12PLY STRL (GAUZE/BANDAGES/DRESSINGS) ×3 IMPLANT
GAUZE SPONGE 4X4 16PLY XRAY LF (GAUZE/BANDAGES/DRESSINGS) IMPLANT
GLOVE BIOGEL PI IND STRL 8.5 (GLOVE) ×1 IMPLANT
GLOVE BIOGEL PI INDICATOR 8.5 (GLOVE) ×2
GLOVE ECLIPSE 8.5 STRL (GLOVE) ×3 IMPLANT
GOWN STRL REUS W/ TWL LRG LVL3 (GOWN DISPOSABLE) IMPLANT
GOWN STRL REUS W/ TWL XL LVL3 (GOWN DISPOSABLE) IMPLANT
GOWN STRL REUS W/TWL 2XL LVL3 (GOWN DISPOSABLE) ×3 IMPLANT
GOWN STRL REUS W/TWL LRG LVL3 (GOWN DISPOSABLE)
GOWN STRL REUS W/TWL XL LVL3 (GOWN DISPOSABLE)
KIT BASIN OR (CUSTOM PROCEDURE TRAY) ×3 IMPLANT
KIT ROOM TURNOVER OR (KITS) ×3 IMPLANT
NDL SPNL 20GX3.5 QUINCKE YW (NEEDLE) IMPLANT
NEEDLE HYPO 22GX1.5 SAFETY (NEEDLE) ×3 IMPLANT
NEEDLE SPNL 20GX3.5 QUINCKE YW (NEEDLE) ×3 IMPLANT
NS IRRIG 1000ML POUR BTL (IV SOLUTION) ×3 IMPLANT
OIL CARTRIDGE MAESTRO DRILL (MISCELLANEOUS)
PACK LAMINECTOMY NEURO (CUSTOM PROCEDURE TRAY) ×3 IMPLANT
PAD ARMBOARD 7.5X6 YLW CONV (MISCELLANEOUS) ×9 IMPLANT
PATTIES SURGICAL .5 X1 (DISPOSABLE) ×1 IMPLANT
RUBBERBAND STERILE (MISCELLANEOUS) ×4 IMPLANT
SPONGE SURGIFOAM ABS GEL SZ50 (HEMOSTASIS) ×3 IMPLANT
SUT VIC AB 1 CT1 18XBRD ANBCTR (SUTURE) ×1 IMPLANT
SUT VIC AB 1 CT1 8-18 (SUTURE) ×3
SUT VIC AB 2-0 CP2 18 (SUTURE) ×3 IMPLANT
SUT VIC AB 3-0 SH 8-18 (SUTURE) ×5 IMPLANT
TOWEL GREEN STERILE (TOWEL DISPOSABLE) ×3 IMPLANT
TOWEL GREEN STERILE FF (TOWEL DISPOSABLE) ×3 IMPLANT
WATER STERILE IRR 1000ML POUR (IV SOLUTION) ×3 IMPLANT

## 2017-10-18 NOTE — Progress Notes (Addendum)
Patient ID: [name] [name], male   DOB: [birth year], 67 y.o.   MRN: [id number] Vital signs are stable Motor function appears intact Left side may be slightly weaker Notes some back soreness Stable postop

## 2017-10-18 NOTE — Anesthesia Procedure Notes (Signed)
Procedure Name: Intubation Date/Time: 10/18/2017 12:04 PM Performed by: Harden Mo, CRNA Pre-anesthesia Checklist: Patient identified, Emergency Drugs available, Suction available and Patient being monitored Patient Re-evaluated:Patient Re-evaluated prior to induction Oxygen Delivery Method: Circle System Utilized Preoxygenation: Pre-oxygenation with 100% oxygen Induction Type: IV induction Ventilation: Mask ventilation without difficulty and Oral airway inserted - appropriate to patient size Laryngoscope Size: Sabra Heck and 2 Grade View: Grade I Tube type: Oral Tube size: 7.5 mm Number of attempts: 1 Airway Equipment and Method: Stylet and Oral airway Placement Confirmation: ETT inserted through vocal cords under direct vision,  positive ETCO2 and breath sounds checked- equal and bilateral Secured at: 23 cm Tube secured with: Tape Dental Injury: Teeth and Oropharynx as per pre-operative assessment

## 2017-10-18 NOTE — Op Note (Signed)
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 Surgeon: Kristeen Miss First assistant: Ashley Jacobs M.D. Anesthesia: Gen. Endotracheal Indications: [name] [name] is a 67 year old individual's had significant back and left lower extremity pain. 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 He is now developed further stenosis at levels of L2-3 and L3-4 and in addition he complains of left lumbar radicular pain. Been advised regarding the need for surgical decompression.  Procedure: Patient was brought to the operating supine on a stretcher. After the smooth induction of general endotracheal anesthesia, he is turned prone. The back was prepped with alcohol DuraPrep and draped in a sterile fashion. A midline incision was created through the previous scar and this was dissected down to the lumbar dorsal fascia. The first identifiable spinous process was noted to be that of L3. Then by doing a subperiosteal dissection L2-3 and L3-4 identified and exposed. Laminotomy was then created removing the inferior marginal lamina of L3 out to the mesial wall the facet. A partial facetectomy was performed in the thickened redundant yellow ligament in this region was taken up. The underlying common dural tube was explored and the laminotomy was enlarged cephalad and caudad and laterally to allow exposure of the common dural tube the take off of the L3 nerve root superiorly and the L4 nerve root inferiorly. On the left side there was noted be a substantially protruding disc with significant osteophytic cover. This causes a substantial amount compression on the left side at L4 nerve root. By gently retracting the nerve root we resected this mass performing both an  osteophytectomy and a discectomy at the level of L3-4 on the left side. In the end the path of the L4 nerve root was much decompressed. Laminotomies were then created L2-3. Here the disc was also noted to be bulging symmetrically but either L3 nerve root was well decompressed. Superiorly foraminotomies were created for the L2 nerve root. Hemostasis was then achieved meticulously and after inspecting each of laminotomies and foraminotomies for adequate nerve root decompression retractors were removed lumbar dorsal fascia was closed with #1 Vicryl interrupted fashion. 20 mL of half percent Marcaine was injected into the paraspinous fascia. The subcutaneous tissue was closed with 2-0 Vicryl and 3-0 Vicryl was used subcuticularly. Blood loss is estimated at 150 mL.

## 2017-10-18 NOTE — Evaluation (Signed)
Physical Therapy Evaluation Patient Details Name: [name] [name] MRN: [id number] DOB: September 18, 1950 Today's Date: 10/18/2017   History of Present Illness  Pt is a 67 y.o. male s/p bilateral L2-4 laminotomies on 10/18/17. PMH includes arthritis, neck surgery (2001).  Clinical Impression  Pt presents with an overall decrease in functional mobility secondary to above. PTA, pt mod indep with SPC; lives with family available for 24/7 assist. Educ on precautions (handout provided), positioning, and importance of mobility. Today, pt able to demonstrating instability ambulating with SPC; stability much improved with use of RW and pt reports increased comfort with this. Some LLE weakness still noted, but pt reports this has improved overall. Pt would benefit from continued acute PT services to maximize functional mobility and independence prior to d/c with HHPT services.     Follow Up Recommendations Home health PT;Supervision for mobility/OOB    Equipment Recommendations  Rolling walker with 5" wheels    Recommendations for Other Services OT consult     Precautions / Restrictions Precautions Precautions: Back Precaution Booklet Issued: Yes (comment) Precaution Comments: Verbally reviewed precautions Restrictions Weight Bearing Restrictions: No      Mobility  Bed Mobility Overal bed mobility: Needs Assistance Bed Mobility: Rolling;Sidelying to Sit;Sit to Sidelying Rolling: Supervision Sidelying to sit: Supervision     Sit to sidelying: Supervision General bed mobility comments: Educ on log roll technique; supervision for safety. Good technique  Transfers Overall transfer level: Needs assistance Equipment used: Straight cane;Rolling walker (2 wheeled) Transfers: Sit to/from Stand Sit to Stand: Supervision;Min guard         General transfer comment: Pt initially standing with SPC, requiring min guard for balance and poor technique. Improved with use of RW and supervision for  safety  Ambulation/Gait Ambulation/Gait assistance: Min guard;Supervision Ambulation Distance (Feet): 150 Feet Assistive device: Straight cane;Rolling walker (2 wheeled) Gait Pattern/deviations: Step-through pattern;Decreased stride length;Decreased dorsiflexion - left Gait velocity: Decreased Gait velocity interpretation: <1.8 ft/sec, indicative of risk for recurrent falls General Gait Details: Pt initially walking with SPC requiring close min guard for balance secondary to instability. Gait much improved with RW, requiring supervision for safety. Pt still with decreased LLE dorsiflexion noted; able to compensate well with increased L hip flexion (reports it does feel better overall than before sx)  Stairs            Wheelchair Mobility    Modified Rankin (Stroke Patients Only)       Balance Overall balance assessment: Needs assistance   Sitting balance-Leahy Scale: Fair       Standing balance-Leahy Scale: Poor Standing balance comment: Reliant on UE support                             Pertinent Vitals/Pain Pain Assessment: Faces Faces Pain Scale: Hurts little more Pain Location: Back Pain Descriptors / Indicators: Sore;Grimacing Pain Intervention(s): Monitored during session    Home Living Family/patient expects to be discharged to:: Private residence Living Arrangements: Spouse/significant other;Children;Other relatives Available Help at Discharge: Family;Available 24 hours/day Type of Home: House Home Access: Ramped entrance     Home Layout: One level Home Equipment: Cane - single point      Prior Function Level of Independence: Independent with assistive device(s)         Comments: Mod indep with SPC secondary to LLE weakness. Endorses 1 fall recently requiring assist to get off floor     Hand Dominance  Extremity/Trunk Assessment   Upper Extremity Assessment Upper Extremity Assessment: Overall WFL for tasks assessed     Lower Extremity Assessment Lower Extremity Assessment: LLE deficits/detail LLE Deficits / Details: Hip and knee 4/5; ankle DF 3-/5, PF 3/5       Communication   Communication: No difficulties  Cognition Arousal/Alertness: Awake/alert Behavior During Therapy: WFL for tasks assessed/performed Overall Cognitive Status: Within Functional Limits for tasks assessed                                        General Comments General comments (skin integrity, edema, etc.): Wife and son present during session    Exercises     Assessment/Plan    PT Assessment Patient needs continued PT services  PT Problem List Decreased strength;Decreased activity tolerance;Decreased balance;Decreased mobility;Decreased knowledge of use of DME       PT Treatment Interventions DME instruction;Gait training;Functional mobility training;Therapeutic activities;Therapeutic exercise;Balance training;Patient/family education    PT Goals (Current goals can be found in the Care Plan section)  Acute Rehab PT Goals Patient Stated Goal: Return home PT Goal Formulation: With patient Time For Goal Achievement: 10/25/17 Potential to Achieve Goals: Good    Frequency Min 5X/week   Barriers to discharge        Co-evaluation               AM-PAC PT "6 Clicks" Daily Activity  Outcome Measure Difficulty turning over in bed (including adjusting bedclothes, sheets and blankets)?: A Little Difficulty moving from lying on back to sitting on the side of the bed? : A Little Difficulty sitting down on and standing up from a chair with arms (e.g., wheelchair, bedside commode, etc,.)?: A Little Help needed moving to and from a bed to chair (including a wheelchair)?: A Little Help needed walking in hospital room?: A Little Help needed climbing 3-5 steps with a railing? : A Little 6 Click Score: 18    End of Session Equipment Utilized During Treatment: Gait belt Activity Tolerance: Patient  tolerated treatment well Patient left: in bed;with call bell/phone within reach;with family/visitor present Nurse Communication: Mobility status PT Visit Diagnosis: Other abnormalities of gait and mobility (R26.89)    Time: 1610-9604 PT Time Calculation (min) (ACUTE ONLY): 20 min   Charges:   PT Evaluation $PT Eval Moderate Complexity: 1 Mod     PT G Codes:       Ina Homes, PT, DPT Acute Rehab Services  Pager: [phone]  Malachy Chamber 10/18/2017, 5:39 PM

## 2017-10-18 NOTE — Anesthesia Postprocedure Evaluation (Signed)
Anesthesia Post Note  Patient: [name] [name]  Procedure(s) Performed: Bilateral Lumbar Two-Three Lumbar Three-Four Laminotomies (Bilateral Back)     Patient location during evaluation: PACU Anesthesia Type: General Level of consciousness: awake and alert Pain management: pain level controlled Vital Signs Assessment: post-procedure vital signs reviewed and stable Respiratory status: spontaneous breathing, nonlabored ventilation and respiratory function stable Cardiovascular status: blood pressure returned to baseline and stable Postop Assessment: no apparent nausea or vomiting Anesthetic complications: no    Last Vitals:  Vitals:   10/18/17 1535 10/18/17 1550  BP: (!) 141/74 135/79  Pulse: 76 78  Resp: 18 16  Temp:  36.8 C  SpO2: 96% 97%    Last Pain:  Vitals:   10/18/17 1520  TempSrc:   PainSc: Asleep                 Paulette Lynch,W. EDMOND

## 2017-10-18 NOTE — H&P (Signed)
[name] [name] is an 67 y.o. male.   Chief Complaint: back pain bilateral leg pain with weakness HPI: [name] [name] is a 67 year old individual who is been a long-standing patient in my practice. About 25 years ago he underl stenosis with neurogenic  He recovered with a moderate degree of function but is kept an element of back pain chronically. He now notes that hi becoming substantially weaker and he has difficulty walking to do even activities of daily living.A recent MRI demonstrates that the patient has developed a restenosis to L4 .he is now admitted to undergo surgical decompression via laminectomy at L2-3 and  L3-4.  No past medical history on file.    No family history on file. Social History:  has no tobacco, alcohol, and drug history on file.  Allergies: Allergies not on file  No medications prior to admission.    No results found for this or any previous visit (from the past 48 hour(s)). No results found.  Review of Systems  Eyes: Negative.   Cardiovascular: Negative.   Gastrointestinal: Negative.   Genitourinary: Negative.   Musculoskeletal: Positive for back pain.       Bilateral leg pain in the glutei iliopsoas quads and gastrocs.  Skin: Negative.   Neurological: Positive for tingling, focal weakness and weakness.  Endo/Heme/Allergies: Negative.   Psychiatric/Behavioral: Negative.     There were no vitals taken for this visit. Physical Exam  Constitutional: He is oriented to person, place, and time. He appears well-developed and well-nourished.  obese  HENT:  Head: Normocephalic and atraumatic.  Eyes: Conjunctivae and EOM are normal.  Neck: Neck supple.  Cardiovascular: Normal rate and regular rhythm.  Respiratory: Effort normal and breath sounds normal.  GI: Soft. Bowel sounds are normal.  Musculoskeletal:  Moderate back pain to palpation and percussion. Straight leg raising is positive at 45. Patrick's maneuver bilaterally  Neurological: He is alert  and oriented to person, place, and time.  Absent reflexes in the patellae and theilles. Babinskis are equivocal in both lower extremities. Hoffman's reflex is negative in the upper extremities. Strength in tone and bulk are in normal in th upper extremities. Wea in the iliopsoas and ttibialis anterior week to 4 out of 5 worse on the left than on the right.  Skin: Skin is warm and dry.  Psychiatric: He has a normal mood and affect. His behavior is normal. Judgment and thought content normal.     Assessment/Plan Lumbar stenosis L2-3 and L3-4, recurrent. Surgical decomession with laminectomy foraminotomy L2-3 L3-4  Earleen Newport, MD 10/18/2017, 7:33 AM

## 2017-10-18 NOTE — Plan of Care (Signed)
  Progressing Safety: Ability to remain free from injury will improve 10/18/2017 1958 - Progressing by Charlena Cross, RN Activity: Ability to avoid complications of mobility impairment will improve 10/18/2017 1958 - Progressing by Charlena Cross, RN Ability to tolerate increased activity will improve 10/18/2017 1958 - Progressing by Charlena Cross, RN Will remain free from falls 10/18/2017 1958 - Progressing by Margot Chimes D, RN Bowel/Gastric: Gastrointestinal status for postoperative course will improve 10/18/2017 1958 - Progressing by Charlena Cross, RN Education: Ability to verbalize activity precautions or restrictions will improve 10/18/2017 1958 - Progressing by Margot Chimes D, RN Knowledge of the prescribed therapeutic regimen will improve 10/18/2017 1958 - Progressing by Charlena Cross, RN Understanding of discharge needs will improve 10/18/2017 1958 - Progressing by Charlena Cross, RN Physical Regulation: Ability to maintain clinical measurements within normal limits will improve 10/18/2017 1958 - Progressing by Charlena Cross, RN Postoperative complications will be avoided or minimized 10/18/2017 1958 - Progressing by Charlena Cross, RN Diagnostic test results will improve 10/18/2017 1958 - Progressing by Margot Chimes D, RN Pain Management: Pain level will decrease 10/18/2017 1958 - Progressing by Charlena Cross, RN Skin Integrity: Signs of wound healing will improve 10/18/2017 1958 - Progressing by Margot Chimes D, RN Health Behavior/Discharge Planning: Identification of resources available to assist in meeting health care needs will improve 10/18/2017 1958 - Progressing by Margot Chimes D, RN Bladder/Genitourinary: Urinary functional status for postoperative course will improve 10/18/2017 1958 - Progressing by Charlena Cross, RN

## 2017-10-18 NOTE — Anesthesia Preprocedure Evaluation (Signed)
Anesthesia Evaluation  Patient identified by MRN, date of birth, ID band Patient awake    Reviewed: Allergy & Precautions, NPO status , Patient's Chart, lab work & pertinent test results  Airway Mallampati: II  TM Distance: >3 FB Neck ROM: Full    Dental no notable dental hx.    Pulmonary neg pulmonary ROS, Current Smoker,    Pulmonary exam normal breath sounds clear to auscultation       Cardiovascular negative cardio ROS Normal cardiovascular exam Rhythm:Regular Rate:Normal     Neuro/Psych negative neurological ROS  negative psych ROS   GI/Hepatic negative GI ROS, Neg liver ROS,   Endo/Other  negative endocrine ROS  Renal/GU negative Renal ROS  negative genitourinary   Musculoskeletal negative musculoskeletal ROS (+)   Abdominal   Peds negative pediatric ROS (+)  Hematology negative hematology ROS (+)   Anesthesia Other Findings   Reproductive/Obstetrics negative OB ROS                            Anesthesia Physical Anesthesia Plan  ASA: III  Anesthesia Plan: General   Post-op Pain Management:    Induction: Intravenous  PONV Risk Score and Plan: 2 and Treatment may vary due to age or medical condition, Ondansetron and Dexamethasone  Airway Management Planned: Oral ETT  Additional Equipment:   Intra-op Plan:   Post-operative Plan: Extubation in OR  Informed Consent: I have reviewed the patients History and Physical, chart, labs and discussed the procedure including the risks, benefits and alternatives for the proposed anesthesia with the patient or authorized representative who has indicated his/her understanding and acceptance.     Plan Discussed with: Anesthesiologist and CRNA  Anesthesia Plan Comments: (  )        Anesthesia Quick Evaluation

## 2017-10-18 NOTE — Transfer of Care (Signed)
Immediate Anesthesia Transfer of Care Note  Patient: [name] [name]  Procedure(s) Performed: Bilateral Lumbar Two-Three Lumbar Three-Four Laminotomies (Bilateral Back)  Patient Location: PACU  Anesthesia Type:General  Level of Consciousness: awake, alert  and oriented  Airway & Oxygen Therapy: Patient Spontanous Breathing  Post-op Assessment: Report given to RN, Post -op Vital signs reviewed and stable and Patient moving all extremities X 4  Post vital signs: Reviewed and stable  Last Vitals:  Vitals:   10/18/17 0910  BP: (!) 151/73  Pulse: 73  Resp: 18  Temp: 36.9 C  SpO2: 100%    Last Pain:  Vitals:   10/18/17 0910  TempSrc: Oral  PainSc: 6       Patients Stated Pain Goal: 2 (16/94/50 3888)  Complications: No apparent anesthesia complications

## 2017-10-19 ENCOUNTER — Encounter (HOSPITAL_COMMUNITY): Payer: Self-pay | Admitting: Neurological Surgery

## 2017-10-19 DIAGNOSIS — M48062 Spinal stenosis, lumbar region with neurogenic claudication: Secondary | ICD-10-CM | POA: Diagnosis not present

## 2017-10-19 MED ORDER — CEPHALEXIN 500 MG PO CAPS: 500.0000 mg | ORAL_CAPSULE | Freq: Four times a day (QID) | ORAL | 0 refills | Status: AC

## 2017-10-19 MED ORDER — OXYCODONE-ACETAMINOPHEN 5-325 MG PO TABS
1.0000 | ORAL_TABLET | ORAL | Status: DC | PRN
  Administered 2017-10-19: 2 via ORAL
  Filled 2017-10-19: qty 2

## 2017-10-19 MED ORDER — METHOCARBAMOL 500 MG PO TABS: 500.0000 mg | ORAL_TABLET | Freq: Four times a day (QID) | ORAL | 3 refills | Status: DC | PRN

## 2017-10-19 MED ORDER — OXYCODONE-ACETAMINOPHEN 5-325 MG PO TABS: 1.0000 | ORAL_TABLET | ORAL | 0 refills | Status: DC | PRN

## 2017-10-19 MED FILL — Thrombin For Soln 5000 Unit: CUTANEOUS | Qty: 3 | Status: AC

## 2017-10-19 NOTE — Progress Notes (Signed)
Physical Therapy Treatment Patient Details Name: [name] [name] MRN: [id number] DOB: Sep 16, 1950 Today's Date: 10/19/2017    History of Present Illness Pt is a 67 y.o. male s/p bilateral L2-4 laminotomies on 10/18/17. PMH includes arthritis, neck surgery (2001).    PT Comments    Pt progressing towards physical therapy goals. Was able to perform transfers and ambulation with gross min guard assist to supervision for safety with RW. Pt was educated on walking program and safe mobility progression, as well as precautions, car transfer, and general safety at home. Will continue to follow and progress as able per POC.    Follow Up Recommendations  Home health PT;Supervision for mobility/OOB     Equipment Recommendations  Rolling walker with 5" wheels    Recommendations for Other Services OT consult     Precautions / Restrictions Precautions Precautions: Back;Fall Precaution Booklet Issued: Yes (comment) Precaution Comments: able to recall all precautions Restrictions Weight Bearing Restrictions: No    Mobility  Bed Mobility Overal bed mobility: Needs Assistance Bed Mobility: Rolling;Supine to Sit Rolling: Supervision   Supine to sit: Supervision     General bed mobility comments: Pt sitting up in recliner upon PT arrival  Transfers Overall transfer level: Needs assistance Equipment used: Rolling walker (2 wheeled) Transfers: Sit to/from Stand Sit to Stand: Min guard         General transfer comment: Pt demonstrated proper hand placement on seated surface when rising to stand, however noted uncontrolled descent to EOB and required cues to reach back for bed prior to initiating stand>sit.  Ambulation/Gait Ambulation/Gait assistance: Min guard;Supervision Ambulation Distance (Feet): 400 Feet Assistive device: Rolling walker (2 wheeled) Gait Pattern/deviations: Step-through pattern;Decreased stride length;Decreased dorsiflexion - left Gait velocity: Decreased Gait  velocity interpretation: Below normal speed for age/gender General Gait Details: Pt ambulated around the bed to get to RW, and required hands on guarding and heavy lean on bed rail for support. With RW, pt ambulated at a supervision level. VC's for improved posture and abdominal bracing during ambulation.    Stairs            Wheelchair Mobility    Modified Rankin (Stroke Patients Only)       Balance Overall balance assessment: Needs assistance Sitting-balance support: No upper extremity supported;Feet supported Sitting balance-Leahy Scale: Good     Standing balance support: Bilateral upper extremity supported;During functional activity Standing balance-Leahy Scale: Fair Standing balance comment: Reliant on UE support                            Cognition Arousal/Alertness: Awake/alert Behavior During Therapy: WFL for tasks assessed/performed Overall Cognitive Status: Within Functional Limits for tasks assessed                                        Exercises      General Comments General comments (skin integrity, edema, etc.): dressing with drainage and RN notified      Pertinent Vitals/Pain Pain Assessment: 0-10 Pain Score: 3  Pain Location: Back during ambulation Pain Descriptors / Indicators: Sore;Operative site guarding Pain Intervention(s): Limited activity within patient's tolerance;Monitored during session;Repositioned    Home Living Family/patient expects to be discharged to:: Private residence Living Arrangements: Spouse/significant other;Children;Other relatives Available Help at Discharge: Family;Available 24 hours/day Type of Home: House Home Access: Ramped entrance   Home Layout: One level Home  Equipment: [place] - single point      Prior Function Level of Independence: Independent with assistive device(s)      Comments: Mod indep with SPC secondary to LLE weakness. Endorses 1 fall recently requiring assist to get  off floor   PT Goals (current goals can now be found in the care plan section) Acute Rehab PT Goals Patient Stated Goal: Return home PT Goal Formulation: With patient Time For Goal Achievement: 10/25/17 Potential to Achieve Goals: Good Progress towards PT goals: Progressing toward goals    Frequency    Min 5X/week      PT Plan Current plan remains appropriate    Co-evaluation              AM-PAC PT "6 Clicks" Daily Activity  Outcome Measure  Difficulty turning over in bed (including adjusting bedclothes, sheets and blankets)?: None Difficulty moving from lying on back to sitting on the side of the bed? : A Little Difficulty sitting down on and standing up from a chair with arms (e.g., wheelchair, bedside commode, etc,.)?: A Little Help needed moving to and from a bed to chair (including a wheelchair)?: A Little Help needed walking in hospital room?: A Little Help needed climbing 3-5 steps with a railing? : A Little 6 Click Score: 19    End of Session Equipment Utilized During Treatment: Gait belt Activity Tolerance: Patient tolerated treatment well Patient left: with call bell/phone within reach;with nursing/sitter in room(Sitting EOB for RN to change dressing) Nurse Communication: Mobility status PT Visit Diagnosis: Other abnormalities of gait and mobility (R26.89)     Time: 1610-9604 PT Time Calculation (min) (ACUTE ONLY): 20 min  Charges:  $Gait Training: 8-22 mins                    G Codes:       Conni Slipper, PT, DPT Acute Rehabilitation Services Pager: [phone]    Marylynn Pearson 10/19/2017, 8:29 AM

## 2017-10-19 NOTE — Discharge Instructions (Signed)

## 2017-10-19 NOTE — Discharge Summary (Signed)
Physician Discharge Summary  Patient ID: [name] [name] MRN: [id number] DOB/AGE: [birth year] 67 y.o.  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 History of congenital lumbar stenosis. History of coronary artery disease  Active Problems:   Lumbar stenosis with neurogenic claudication   Discharged Condition: fair  Hospital Course: Patient was admitted to undergo surgical decompression at L2-3 and L3 for which she tolerated well. His incision has some modest drainage. This will be observed by home health nursing.  Consults: None  Significant Diagnostic Studies: None  Treatments: surgery: Surgery, laminotomy foraminotomy L2-3 and L3-4 discectomy on the left at L3-4  Discharge Exam: Blood pressure 128/71, pulse 78, temperature 98.2 F (36.8 C), temperature source Oral, resp. rate 18, height 5\' 10"  (1.778 m), weight 97.5 kg (215 lb), SpO2 99 %. Incision is clean has some drainage at the top of the incision. Motor function demonstrates some left-sided leg weakness in the tibialis anterior and gastroc. Station and gait are otherwise intact.  Disposition:  discharge home  Discharge Instructions    Call MD for:  redness, tenderness, or signs of infection (pain, swelling, redness, odor or green/yellow discharge around incision site)   Complete by:  As directed    Call MD for:  severe uncontrolled pain   Complete by:  As directed    Call MD for:  temperature >100.4   Complete by:  As directed    Diet - low sodium heart healthy   Complete by:  As directed    Discharge instructions   Complete by:  As directed    Okay to shower. Do not apply salves or ointments to incision. No heavy lifting with the upper extremities greater than 15 pounds. May resume driving when not requiring pain  medication and patient feels comfortable with doing so.   Incentive spirometry RT   Complete by:  As directed    Increase activity slowly   Complete by:  As directed      Allergies as of 10/19/2017   No Known Allergies     Medication List    TAKE these medications   cephALEXin 500 MG capsule Commonly known as:  KEFLEX Take 1 capsule (500 mg total) by mouth 4 (four) times daily for 10 days.   methocarbamol 500 MG tablet Commonly known as:  ROBAXIN Take 1 tablet (500 mg total) by mouth every 6 (six) hours as needed for muscle spasms.   oxyCODONE-acetaminophen 5-325 MG tablet Commonly known as:  PERCOCET/ROXICET Take 1-2 tablets by mouth every 4 (four) hours as needed for severe pain.   triamcinolone cream 0.1 % Commonly known as:  KENALOG Apply 1 application topically 2 (two) times daily.   vitamin B-12 1000 MCG tablet Commonly known as:  CYANOCOBALAMIN Take 1,000 mcg by mouth daily.   vitamin E 100 UNIT capsule Take 100 Units by mouth daily.            Durable Medical Equipment  (From admission, onward)        Start     Ordered   10/19/17 0846  For home use only DME Walker rolling  [facility])  Once    Question:  Patient needs a walker to treat with the following condition  Answer:  Lumbar radiculopathy, chronic   10/19/17 0846       Signed: Earleen Newport 10/19/2017, 8:46 AM

## 2017-10-19 NOTE — Evaluation (Signed)
Occupational Therapy Evaluation Patient Details Name: [name] [name] MRN: [id number] DOB: July 25, 1951 Today's Date: 10/19/2017    History of Present Illness Pt is a 67 y.o. male s/p bilateral L2-4 laminotomies on 10/18/17. PMH includes arthritis, neck surgery (2001).   Clinical Impression   Patient is s/p L2-4 surgery resulting in functional limitations due to the deficits listed below (see OT problem list). Pt currently with need for RW and will need (A) for tub transfer for d/c home. Pt declines 3n1 at this time.  Patient will benefit from skilled OT acutely to increase independence and safety with ADLS to allow discharge home with RW.     Follow Up Recommendations  No OT follow up    Equipment Recommendations  Other (comment)(rw)    Recommendations for Other Services       Precautions / Restrictions Precautions Precautions: Back Precaution Comments: able to recall all precautions      Mobility Bed Mobility Overal bed mobility: Needs Assistance Bed Mobility: Rolling;Supine to Sit Rolling: Supervision   Supine to sit: Supervision     General bed mobility comments: no bed rails  Transfers Overall transfer level: Needs assistance Equipment used: Rolling walker (2 wheeled) Transfers: Sit to/from Stand Sit to Stand: Min guard         General transfer comment: cues for hand placement and not to pull on RW    Balance Overall balance assessment: Needs assistance Sitting-balance support: No upper extremity supported;Feet supported Sitting balance-Leahy Scale: Good     Standing balance support: Bilateral upper extremity supported;During functional activity Standing balance-Leahy Scale: Fair                             ADL either performed or assessed with clinical judgement   ADL Overall ADL's : Needs assistance/impaired Eating/Feeding: Independent   Grooming: Wash/dry hands;Supervision/safety Grooming Details (indicate cue type and reason):  educated on sink level adls with back precautions Upper Body Bathing: Supervision/ safety   Lower Body Bathing: Supervison/ safety;Sit to/from stand Lower Body Bathing Details (indicate cue type and reason): pt able to cross bil LE                 Tub/ Shower Transfer: Minimal assistance;Rolling walker Tub/Shower Transfer Details (indicate cue type and reason): educated on positioning and fall risk. making sure (A) for transfer and warm water Functional mobility during ADLs: Min guard;Rolling walker    Back handout provided and reviewed adls in detail. Pt educated on: avoid sitting for long periods of time, correct bed positioning for sleeping, correct sequence for bed mobility, avoiding lifting more than 5 pounds and never wash directly over incision. All education is complete and patient indicates understanding.     Vision   Vision Assessment?: No apparent visual deficits     Perception     Praxis      Pertinent Vitals/Pain Pain Assessment: 0-10 Pain Score: 8  Pain Location: Back Pain Descriptors / Indicators: Sore;Operative site guarding Pain Intervention(s): Monitored during session;Premedicated before session;Repositioned     Hand Dominance Right   Extremity/Trunk Assessment Upper Extremity Assessment Upper Extremity Assessment: Overall WFL for tasks assessed   Lower Extremity Assessment Lower Extremity Assessment: Defer to PT evaluation   Cervical / Trunk Assessment Cervical / Trunk Assessment: Other exceptions(s/p surg)   Communication Communication Communication: No difficulties   Cognition Arousal/Alertness: Awake/alert Behavior During Therapy: WFL for tasks assessed/performed Overall Cognitive Status: Within Functional Limits for tasks assessed  General Comments  dressing with drainage and RN notified    Exercises     Shoulder Instructions      Home Living Family/patient expects to be  discharged to:: Private residence Living Arrangements: Spouse/significant other;Children;Other relatives Available Help at Discharge: Family;Available 24 hours/day Type of Home: House Home Access: Ramped entrance     Home Layout: One level     Bathroom Shower/Tub: Chief Strategy Officer: Standard     Home Equipment: Cane - single point          Prior Functioning/Environment Level of Independence: Independent with assistive device(s)        Comments: Mod indep with SPC secondary to LLE weakness. Endorses 1 fall recently requiring assist to get off floor        OT Problem List: Decreased strength;Decreased activity tolerance;Impaired balance (sitting and/or standing);Decreased safety awareness;Decreased knowledge of use of DME or AE;Decreased knowledge of precautions;Pain      OT Treatment/Interventions: Self-care/ADL training;Therapeutic exercise;DME and/or AE instruction;Therapeutic activities;Patient/family education;Balance training    OT Goals(Current goals can be found in the care plan section) Acute Rehab OT Goals Patient Stated Goal: Return home OT Goal Formulation: With patient Time For Goal Achievement: 11/02/17 Potential to Achieve Goals: Good  OT Frequency: Min 2X/week   Barriers to D/C:            Co-evaluation              AM-PAC PT "6 Clicks" Daily Activity     Outcome Measure Help from another person eating meals?: None Help from another person taking care of personal grooming?: None Help from another person toileting, which includes using toliet, bedpan, or urinal?: None Help from another person bathing (including washing, rinsing, drying)?: None Help from another person to put on and taking off regular upper body clothing?: None Help from another person to put on and taking off regular lower body clothing?: None 6 Click Score: 24   End of Session Equipment Utilized During Treatment: Gait belt;Rolling walker Nurse  Communication: Mobility status;Precautions  Activity Tolerance: Patient tolerated treatment well Patient left: in chair;with call bell/phone within reach  OT Visit Diagnosis: Unsteadiness on feet (R26.81);Pain Pain - part of body: (back)                Time: 1914-7829 OT Time Calculation (min): 20 min Charges:  OT General Charges $OT Visit: 1 Visit OT Evaluation $OT Eval Moderate Complexity: 1 Mod G-Codes:      [name] [name]   OTR/L Pager: [phone] Office: [phone] .   Boone Master B 10/19/2017, 8:06 AM

## 2017-10-19 NOTE — Care Management Note (Signed)
Case Management Note  Patient Details  Name: KEYSHAWN HELLWIG MRN: [id number] Date of Birth: March 01, 1951  Subjective/Objective:                Spoke w patient at bedside to discuss [facility] options. He would like to use [facility]. Referral made and accepted.     Action/Plan:   Expected Discharge Date:  10/19/17               Expected Discharge Plan:  [place]  In-House Referral:     Discharge planning Services  CM Consult  Post Acute Care Choice:  Home Health, Durable Medical Equipment Choice offered to:  Patient  DME Arranged:  Walker rolling DME Agency:  [place] Arranged:  RN, PT [facility] Agency:  [place]  Status of Service:  Completed, signed off  If discussed at [place] of Stay Meetings, dates discussed:    Additional Comments:  Carles Collet, RN 10/19/2017, 9:19 AM

## 2017-10-19 NOTE — Progress Notes (Signed)
Pt doing well. Pt and family given D/C instructions with Rx's, verbal understanding was provided. Pt's incision is covered with Honeycomb dressing with a moderate amount of drainage. Pt's IV was removed prior to D/C. Pt D/C'd home via wheelchair @ 1030 per MD order. Pt has [place] and RN arranged by CM prior to D/C. Pt received RW from [place] prior to D/C. Pt is stable @ D/C and has no other needs at this time. Holli Humbles, RN

## 2017-10-20 DIAGNOSIS — Z79891 Long term (current) use of opiate analgesic: Secondary | ICD-10-CM | POA: Diagnosis not present

## 2017-10-20 DIAGNOSIS — Z4789 Encounter for other orthopedic aftercare: Secondary | ICD-10-CM | POA: Diagnosis not present

## 2017-10-20 DIAGNOSIS — Z981 Arthrodesis status: Secondary | ICD-10-CM | POA: Diagnosis not present

## 2017-10-20 DIAGNOSIS — M48062 Spinal stenosis, lumbar region with neurogenic claudication: Secondary | ICD-10-CM | POA: Diagnosis not present

## 2017-10-24 DIAGNOSIS — Z981 Arthrodesis status: Secondary | ICD-10-CM | POA: Diagnosis not present

## 2017-10-24 DIAGNOSIS — Z4789 Encounter for other orthopedic aftercare: Secondary | ICD-10-CM | POA: Diagnosis not present

## 2017-10-24 DIAGNOSIS — Z79891 Long term (current) use of opiate analgesic: Secondary | ICD-10-CM | POA: Diagnosis not present

## 2017-10-24 DIAGNOSIS — M48062 Spinal stenosis, lumbar region with neurogenic claudication: Secondary | ICD-10-CM | POA: Diagnosis not present

## 2017-10-25 DIAGNOSIS — M48062 Spinal stenosis, lumbar region with neurogenic claudication: Secondary | ICD-10-CM | POA: Diagnosis not present

## 2017-10-25 DIAGNOSIS — Z981 Arthrodesis status: Secondary | ICD-10-CM | POA: Diagnosis not present

## 2017-10-25 DIAGNOSIS — Z4789 Encounter for other orthopedic aftercare: Secondary | ICD-10-CM | POA: Diagnosis not present

## 2017-10-25 DIAGNOSIS — Z79891 Long term (current) use of opiate analgesic: Secondary | ICD-10-CM | POA: Diagnosis not present

## 2017-10-26 DIAGNOSIS — T8130XA Disruption of wound, unspecified, initial encounter: Secondary | ICD-10-CM | POA: Diagnosis not present

## 2017-10-27 DIAGNOSIS — Z981 Arthrodesis status: Secondary | ICD-10-CM | POA: Diagnosis not present

## 2017-10-27 DIAGNOSIS — Z4789 Encounter for other orthopedic aftercare: Secondary | ICD-10-CM | POA: Diagnosis not present

## 2017-10-27 DIAGNOSIS — Z79891 Long term (current) use of opiate analgesic: Secondary | ICD-10-CM | POA: Diagnosis not present

## 2017-10-27 DIAGNOSIS — M48062 Spinal stenosis, lumbar region with neurogenic claudication: Secondary | ICD-10-CM | POA: Diagnosis not present

## 2017-10-31 DIAGNOSIS — Z79891 Long term (current) use of opiate analgesic: Secondary | ICD-10-CM | POA: Diagnosis not present

## 2017-10-31 DIAGNOSIS — Z981 Arthrodesis status: Secondary | ICD-10-CM | POA: Diagnosis not present

## 2017-10-31 DIAGNOSIS — M48062 Spinal stenosis, lumbar region with neurogenic claudication: Secondary | ICD-10-CM | POA: Diagnosis not present

## 2017-10-31 DIAGNOSIS — Z4789 Encounter for other orthopedic aftercare: Secondary | ICD-10-CM | POA: Diagnosis not present

## 2017-10-31 DIAGNOSIS — T8130XA Disruption of wound, unspecified, initial encounter: Secondary | ICD-10-CM | POA: Diagnosis not present

## 2017-11-04 DIAGNOSIS — M48062 Spinal stenosis, lumbar region with neurogenic claudication: Secondary | ICD-10-CM | POA: Diagnosis not present

## 2017-11-04 DIAGNOSIS — Z981 Arthrodesis status: Secondary | ICD-10-CM | POA: Diagnosis not present

## 2017-11-04 DIAGNOSIS — Z79891 Long term (current) use of opiate analgesic: Secondary | ICD-10-CM | POA: Diagnosis not present

## 2017-11-04 DIAGNOSIS — Z4789 Encounter for other orthopedic aftercare: Secondary | ICD-10-CM | POA: Diagnosis not present

## 2017-11-10 DIAGNOSIS — T8130XA Disruption of wound, unspecified, initial encounter: Secondary | ICD-10-CM | POA: Diagnosis not present

## 2017-11-11 DIAGNOSIS — Z79891 Long term (current) use of opiate analgesic: Secondary | ICD-10-CM | POA: Diagnosis not present

## 2017-11-11 DIAGNOSIS — M48062 Spinal stenosis, lumbar region with neurogenic claudication: Secondary | ICD-10-CM | POA: Diagnosis not present

## 2017-11-11 DIAGNOSIS — Z4789 Encounter for other orthopedic aftercare: Secondary | ICD-10-CM | POA: Diagnosis not present

## 2017-11-11 DIAGNOSIS — Z981 Arthrodesis status: Secondary | ICD-10-CM | POA: Diagnosis not present

## 2017-11-16 DIAGNOSIS — Z981 Arthrodesis status: Secondary | ICD-10-CM | POA: Diagnosis not present

## 2017-11-16 DIAGNOSIS — Z79891 Long term (current) use of opiate analgesic: Secondary | ICD-10-CM | POA: Diagnosis not present

## 2017-11-16 DIAGNOSIS — M48062 Spinal stenosis, lumbar region with neurogenic claudication: Secondary | ICD-10-CM | POA: Diagnosis not present

## 2017-11-16 DIAGNOSIS — Z4789 Encounter for other orthopedic aftercare: Secondary | ICD-10-CM | POA: Diagnosis not present

## 2017-12-15 DIAGNOSIS — M48062 Spinal stenosis, lumbar region with neurogenic claudication: Secondary | ICD-10-CM | POA: Diagnosis not present

## 2017-12-20 DIAGNOSIS — M48062 Spinal stenosis, lumbar region with neurogenic claudication: Secondary | ICD-10-CM | POA: Diagnosis not present

## 2017-12-22 DIAGNOSIS — M48062 Spinal stenosis, lumbar region with neurogenic claudication: Secondary | ICD-10-CM | POA: Diagnosis not present

## 2017-12-27 DIAGNOSIS — M48062 Spinal stenosis, lumbar region with neurogenic claudication: Secondary | ICD-10-CM | POA: Diagnosis not present

## 2017-12-29 DIAGNOSIS — M48062 Spinal stenosis, lumbar region with neurogenic claudication: Secondary | ICD-10-CM | POA: Diagnosis not present

## 2018-01-03 DIAGNOSIS — M48062 Spinal stenosis, lumbar region with neurogenic claudication: Secondary | ICD-10-CM | POA: Diagnosis not present

## 2018-01-05 DIAGNOSIS — M48062 Spinal stenosis, lumbar region with neurogenic claudication: Secondary | ICD-10-CM | POA: Diagnosis not present

## 2018-01-10 DIAGNOSIS — M48062 Spinal stenosis, lumbar region with neurogenic claudication: Secondary | ICD-10-CM | POA: Diagnosis not present

## 2018-01-12 DIAGNOSIS — M48062 Spinal stenosis, lumbar region with neurogenic claudication: Secondary | ICD-10-CM | POA: Diagnosis not present

## 2018-02-14 DIAGNOSIS — L309 Dermatitis, unspecified: Secondary | ICD-10-CM | POA: Diagnosis not present

## 2018-02-14 DIAGNOSIS — L281 Prurigo nodularis: Secondary | ICD-10-CM | POA: Diagnosis not present

## 2018-02-14 DIAGNOSIS — L439 Lichen planus, unspecified: Secondary | ICD-10-CM | POA: Diagnosis not present

## 2018-02-15 DIAGNOSIS — R030 Elevated blood-pressure reading, without diagnosis of hypertension: Secondary | ICD-10-CM | POA: Diagnosis not present

## 2018-02-15 DIAGNOSIS — Z6831 Body mass index (BMI) 31.0-31.9, adult: Secondary | ICD-10-CM | POA: Diagnosis not present

## 2018-02-15 DIAGNOSIS — R29898 Other symptoms and signs involving the musculoskeletal system: Secondary | ICD-10-CM | POA: Diagnosis not present

## 2018-02-17 ENCOUNTER — Other Ambulatory Visit: Payer: Self-pay | Admitting: Neurological Surgery

## 2018-02-17 ENCOUNTER — Other Ambulatory Visit (HOSPITAL_COMMUNITY): Payer: Self-pay | Admitting: Neurological Surgery

## 2018-02-17 DIAGNOSIS — R29898 Other symptoms and signs involving the musculoskeletal system: Secondary | ICD-10-CM

## 2018-02-17 MED ORDER — SODIUM CHLORIDE 0.9 % IV SOLN: 4.0000 mg | Freq: Four times a day (QID) | INTRAVENOUS | Status: AC | PRN

## 2018-03-22 ENCOUNTER — Ambulatory Visit (HOSPITAL_COMMUNITY)
Admission: RE | Admit: 2018-03-22 | Discharge: 2018-03-22 | Disposition: A | Discharge status: Home / Self Care | Payer: Medicare HMO | Source: Ambulatory Visit | Attending: Neurological Surgery | Admitting: Neurological Surgery

## 2018-03-22 ENCOUNTER — Other Ambulatory Visit: Payer: Self-pay

## 2018-03-22 DIAGNOSIS — I77811 Abdominal aortic ectasia: Secondary | ICD-10-CM | POA: Diagnosis not present

## 2018-03-22 DIAGNOSIS — M4804 Spinal stenosis, thoracic region: Secondary | ICD-10-CM | POA: Insufficient documentation

## 2018-03-22 DIAGNOSIS — M5136 Other intervertebral disc degeneration, lumbar region: Secondary | ICD-10-CM | POA: Insufficient documentation

## 2018-03-22 DIAGNOSIS — M48061 Spinal stenosis, lumbar region without neurogenic claudication: Secondary | ICD-10-CM | POA: Diagnosis not present

## 2018-03-22 DIAGNOSIS — R29898 Other symptoms and signs involving the musculoskeletal system: Secondary | ICD-10-CM

## 2018-03-22 DIAGNOSIS — M4316 Spondylolisthesis, lumbar region: Secondary | ICD-10-CM | POA: Diagnosis not present

## 2018-03-22 DIAGNOSIS — I700 Atherosclerosis of aorta: Secondary | ICD-10-CM | POA: Insufficient documentation

## 2018-03-22 DIAGNOSIS — M48062 Spinal stenosis, lumbar region with neurogenic claudication: Secondary | ICD-10-CM | POA: Insufficient documentation

## 2018-03-22 MED ORDER — IOPAMIDOL (ISOVUE-M 200) INJECTION 41%
20.0000 mL | Freq: Once | INTRAMUSCULAR | Status: AC
  Administered 2018-03-22: 12 mL via INTRATHECAL

## 2018-03-22 MED ORDER — ONDANSETRON HCL 4 MG/2ML IJ SOLN: 4.0000 mg | Freq: Four times a day (QID) | INTRAMUSCULAR | Status: DC | PRN

## 2018-03-22 MED ORDER — LIDOCAINE HCL (PF) 1 % IJ SOLN
INTRAMUSCULAR | Status: AC
  Administered 2018-03-22: 2 mL via INTRADERMAL
  Filled 2018-03-22: qty 5

## 2018-03-22 MED ORDER — HYDROCODONE-ACETAMINOPHEN 5-325 MG PO TABS: 1.0000 | ORAL_TABLET | ORAL | Status: DC | PRN

## 2018-03-22 MED ORDER — DIAZEPAM 5 MG PO TABS
10.0000 mg | ORAL_TABLET | Freq: Once | ORAL | Status: AC
  Administered 2018-03-22: 10 mg via ORAL

## 2018-03-22 MED ORDER — IOPAMIDOL (ISOVUE-M 200) INJECTION 41%
INTRAMUSCULAR | Status: AC
  Administered 2018-03-22: 12 mL via INTRATHECAL
  Filled 2018-03-22: qty 10

## 2018-03-22 MED ORDER — DIAZEPAM 5 MG PO TABS
ORAL_TABLET | ORAL | Status: AC
  Filled 2018-03-22: qty 2

## 2018-03-22 MED ORDER — LIDOCAINE HCL (PF) 1 % IJ SOLN
5.0000 mL | Freq: Once | INTRAMUSCULAR | Status: AC
  Administered 2018-03-22: 2 mL via INTRADERMAL

## 2018-03-22 NOTE — Procedures (Signed)
Mr. [name] [name] is a 67 year old individual whose had significant history of lumbar stenosis in the past.  Many years ago he underwent a multilevel decompression via laminotomies and a laminectomy that included L3-L4 and L5.  Over the years he developed restenosis in about a year ago underwent further surgery to decompress several other levels he has had persistent symptoms of neurogenic claudication has been advised to undergo a myelogram and post myelogram CAT scan to reevaluate this area.  Pre op Dx: Lumbar stenosis with neurogenic claudication history of laminectomy Post op Dx: Same Procedure: Lumbar myelogram Surgeon: Mel Langan Puncture level: L1 -2  Fluid color: Clear colorless Injection: Isovue-200, 12 mL Findings: Stenosis in the lower lumbar spine further evaluation with CT scan.

## 2018-03-22 NOTE — Discharge Instructions (Signed)
**Note [name] [name]-identified via Obfuscation** Myelogram, Care After °These instructions give you information about caring for yourself after your procedure. Your doctor may also give you more specific instructions. Call your doctor if you have any problems or questions after your procedure. °Follow these instructions at home: °· Drink enough fluid to keep your pee (urine) clear or pale yellow. °· Rest as told by your doctor. °· Lie flat with your head slightly raised (elevated). °· Do not bend, lift, or do any hard activities for 24-48 hours or as told by your doctor. °· Take over-the-counter and prescription medicines only as told by your doctor. °· Take care of and remove your bandage (dressing) as told by your doctor. °· Bathe or shower as told by your doctor. °Contact a health care provider if: °· You have a fever. °· You have a headache that lasts longer than 24 hours. °· You feel sick to your stomach (nauseous). °· You throw up (vomit). °· Your neck is stiff. °· Your legs feel numb. °· You cannot pee. °· You cannot poop (have a bowel movement). °· You have a rash. °· You are itchy or sneezing. °Get help right away if: °· You have new symptoms or your symptoms get worse. °· You have a seizure. °· You have trouble breathing. °This information is not intended to replace advice given to you by your health care provider. Make sure you discuss any questions you have with your health care provider. °Document Released: 06/01/2008 Document Revised: 04/22/2016 Document Reviewed: 06/05/2015 °Elsevier Interactive Patient Education © 2018 [employer] ° °

## 2018-04-05 DIAGNOSIS — M48062 Spinal stenosis, lumbar region with neurogenic claudication: Secondary | ICD-10-CM | POA: Diagnosis not present

## 2018-04-05 DIAGNOSIS — Z6831 Body mass index (BMI) 31.0-31.9, adult: Secondary | ICD-10-CM | POA: Diagnosis not present

## 2018-04-05 DIAGNOSIS — R030 Elevated blood-pressure reading, without diagnosis of hypertension: Secondary | ICD-10-CM | POA: Diagnosis not present

## 2018-05-04 DIAGNOSIS — M545 Low back pain: Secondary | ICD-10-CM | POA: Diagnosis not present

## 2018-05-04 DIAGNOSIS — M48061 Spinal stenosis, lumbar region without neurogenic claudication: Secondary | ICD-10-CM | POA: Diagnosis not present

## 2018-05-04 DIAGNOSIS — M4306 Spondylolysis, lumbar region: Secondary | ICD-10-CM | POA: Diagnosis not present

## 2018-05-04 DIAGNOSIS — M542 Cervicalgia: Secondary | ICD-10-CM | POA: Diagnosis not present

## 2018-05-25 DIAGNOSIS — M4712 Other spondylosis with myelopathy, cervical region: Secondary | ICD-10-CM | POA: Diagnosis not present

## 2018-06-08 DIAGNOSIS — M4802 Spinal stenosis, cervical region: Secondary | ICD-10-CM | POA: Diagnosis not present

## 2018-06-08 DIAGNOSIS — M5030 Other cervical disc degeneration, unspecified cervical region: Secondary | ICD-10-CM | POA: Diagnosis not present

## 2018-06-08 DIAGNOSIS — M542 Cervicalgia: Secondary | ICD-10-CM | POA: Diagnosis not present

## 2018-06-08 DIAGNOSIS — G9589 Other specified diseases of spinal cord: Secondary | ICD-10-CM | POA: Diagnosis not present

## 2018-06-08 DIAGNOSIS — M4712 Other spondylosis with myelopathy, cervical region: Secondary | ICD-10-CM | POA: Diagnosis not present

## 2018-06-15 DIAGNOSIS — G9589 Other specified diseases of spinal cord: Secondary | ICD-10-CM | POA: Diagnosis not present

## 2018-06-15 DIAGNOSIS — M4712 Other spondylosis with myelopathy, cervical region: Secondary | ICD-10-CM | POA: Diagnosis not present

## 2018-06-26 ENCOUNTER — Other Ambulatory Visit (HOSPITAL_COMMUNITY): Payer: Self-pay | Admitting: Neurological Surgery

## 2018-06-26 ENCOUNTER — Other Ambulatory Visit: Payer: Self-pay | Admitting: Neurological Surgery

## 2018-06-26 DIAGNOSIS — M4712 Other spondylosis with myelopathy, cervical region: Secondary | ICD-10-CM

## 2018-07-04 ENCOUNTER — Ambulatory Visit (HOSPITAL_COMMUNITY)
Admission: RE | Admit: 2018-07-04 | Discharge: 2018-07-04 | Disposition: A | Discharge status: Home / Self Care | Payer: Medicare HMO | Source: Ambulatory Visit | Attending: Neurological Surgery | Admitting: Neurological Surgery

## 2018-07-04 DIAGNOSIS — M4712 Other spondylosis with myelopathy, cervical region: Secondary | ICD-10-CM | POA: Diagnosis not present

## 2018-07-04 DIAGNOSIS — M47812 Spondylosis without myelopathy or radiculopathy, cervical region: Secondary | ICD-10-CM | POA: Diagnosis not present

## 2018-07-13 DIAGNOSIS — M488X2 Other specified spondylopathies, cervical region: Secondary | ICD-10-CM | POA: Diagnosis not present

## 2018-10-10 DIAGNOSIS — L280 Lichen simplex chronicus: Secondary | ICD-10-CM | POA: Diagnosis not present

## 2018-10-10 DIAGNOSIS — T50905A Adverse effect of unspecified drugs, medicaments and biological substances, initial encounter: Secondary | ICD-10-CM | POA: Diagnosis not present

## 2018-10-10 DIAGNOSIS — D485 Neoplasm of uncertain behavior of skin: Secondary | ICD-10-CM | POA: Diagnosis not present

## 2018-10-31 DIAGNOSIS — L280 Lichen simplex chronicus: Secondary | ICD-10-CM | POA: Diagnosis not present

## 2019-03-26 DIAGNOSIS — M79671 Pain in right foot: Secondary | ICD-10-CM | POA: Diagnosis not present

## 2019-03-26 DIAGNOSIS — M79672 Pain in left foot: Secondary | ICD-10-CM | POA: Diagnosis not present

## 2019-03-26 DIAGNOSIS — E1140 Type 2 diabetes mellitus with diabetic neuropathy, unspecified: Secondary | ICD-10-CM | POA: Diagnosis not present

## 2019-03-26 DIAGNOSIS — L110 Acquired keratosis follicularis: Secondary | ICD-10-CM | POA: Diagnosis not present

## 2019-03-26 DIAGNOSIS — B351 Tinea unguium: Secondary | ICD-10-CM | POA: Diagnosis not present

## 2019-04-23 DIAGNOSIS — M79671 Pain in right foot: Secondary | ICD-10-CM | POA: Diagnosis not present

## 2019-04-23 DIAGNOSIS — M79672 Pain in left foot: Secondary | ICD-10-CM | POA: Diagnosis not present

## 2019-04-23 DIAGNOSIS — B351 Tinea unguium: Secondary | ICD-10-CM | POA: Diagnosis not present

## 2019-04-23 DIAGNOSIS — L110 Acquired keratosis follicularis: Secondary | ICD-10-CM | POA: Diagnosis not present

## 2019-04-23 DIAGNOSIS — E1140 Type 2 diabetes mellitus with diabetic neuropathy, unspecified: Secondary | ICD-10-CM | POA: Diagnosis not present

## 2019-08-03 IMAGING — CT CT L SPINE W/ CM
3 of 4 series · 10 of 33 positions shown, 12 images · non-contrast
Comparison: Lumbar MRI 09/22/2017. Lumbar radiographs 12/15/2017,
and earlier period

CLINICAL DATA: 67-year-old male with remote lumbar spine surgery in
[REDACTED], and recent bilateral laminotomies at L2-L3 and L3-L4.
Increased bilateral lower extremity weakness greater on the left.
Low back pain.

EXAM:
LUMBAR MYELOGRAM
FLUOROSCOPY TIME:  0 minutes 54 seconds
PROCEDURE:
Lumbar puncture and intrathecal contrast administration were
performed by Dr. KHULDOON RAJAPAKSHA who will separately report for the
portion of the procedure.
I personally acquired the myelogram images.
TECHNIQUE: Contiguous axial images were obtained through the Lumbar spine after
the intrathecal infusion of infusion. Coronal and sagittal
reconstructions were obtained of the axial image sets.

[Series 5: l spine 2.0 st · axial · 0.28mm/px · z∈[+1195,+1271]mm · 2 of 113 slices shown, 3 images]
[im 38/113  soft-tissue]
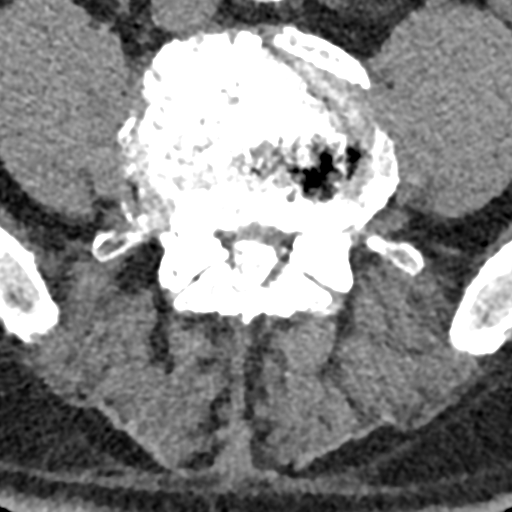
[im 38/113  bone]
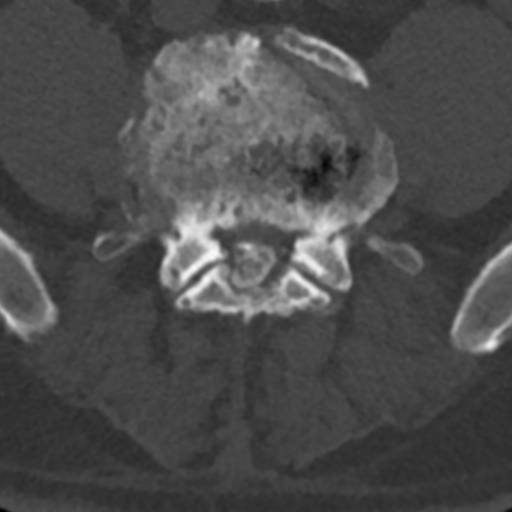
[im 75/113  bone]
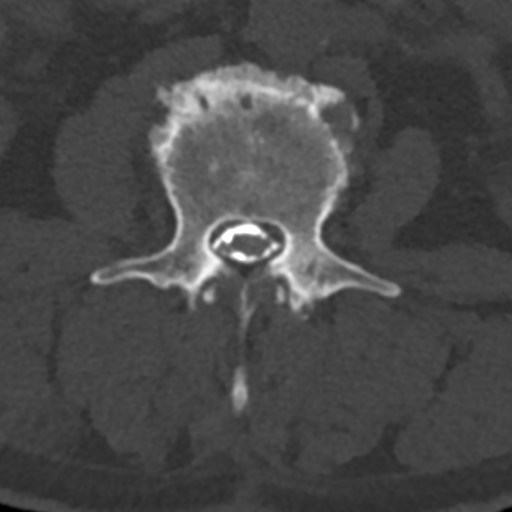

[Series 8: coronal st · coronal · 0.33mm/px · 3 of 59 slices shown]
[im 12/59  bone]
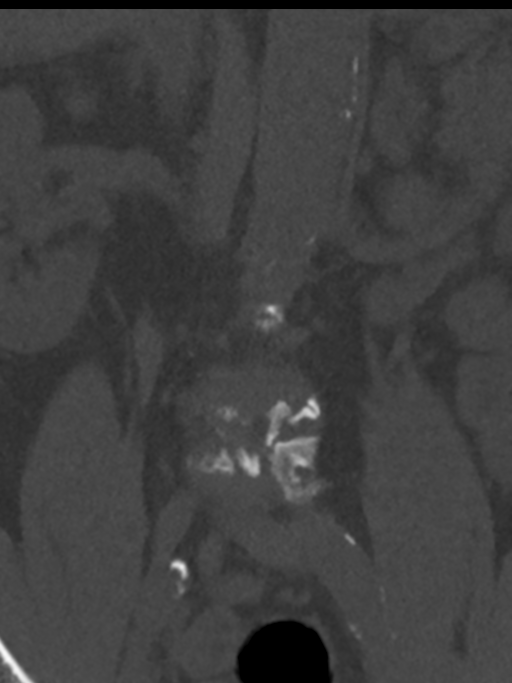
[im 24/59  bone]
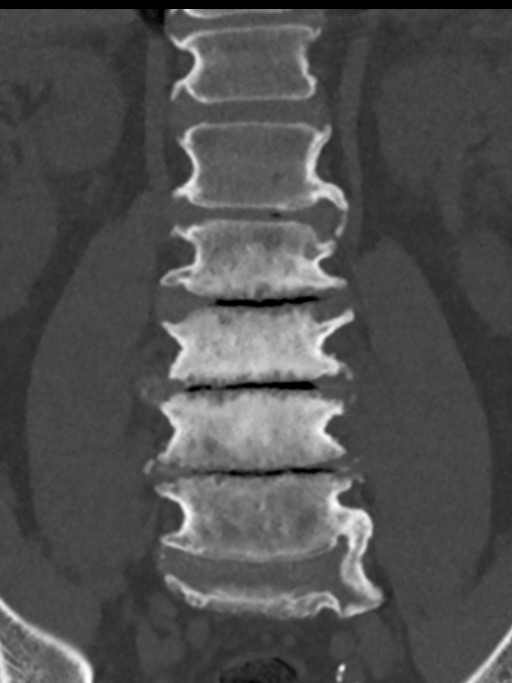
[im 35/59  bone]
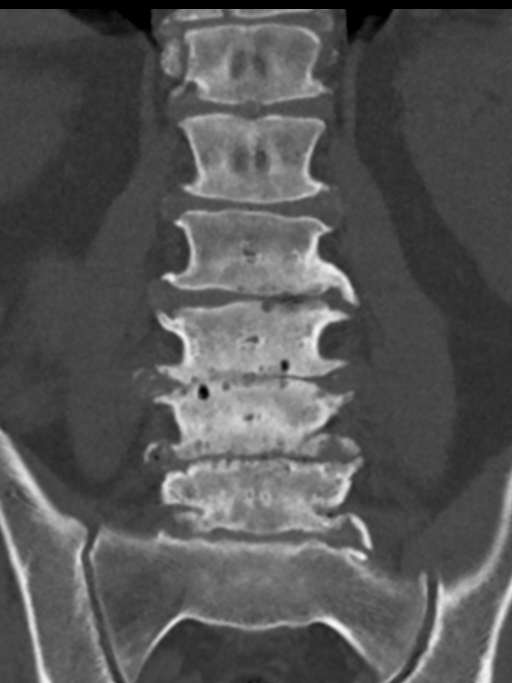

[Series 9: sagittal st · sagittal · 0.33mm/px · 5 of 61 slices shown, 6 images]
[im 21/61  bone]
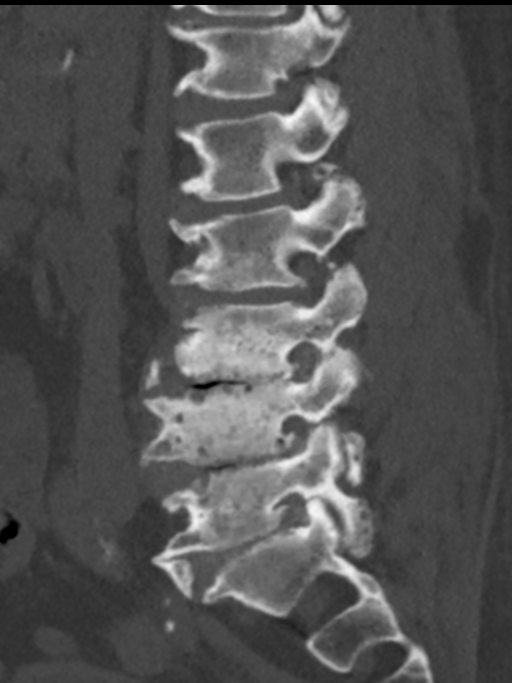
[im 26/61  bone]
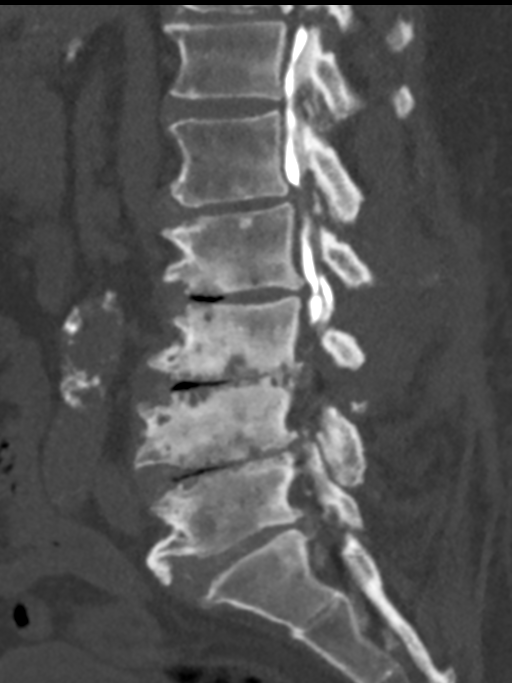
[im 31/61  soft-tissue]
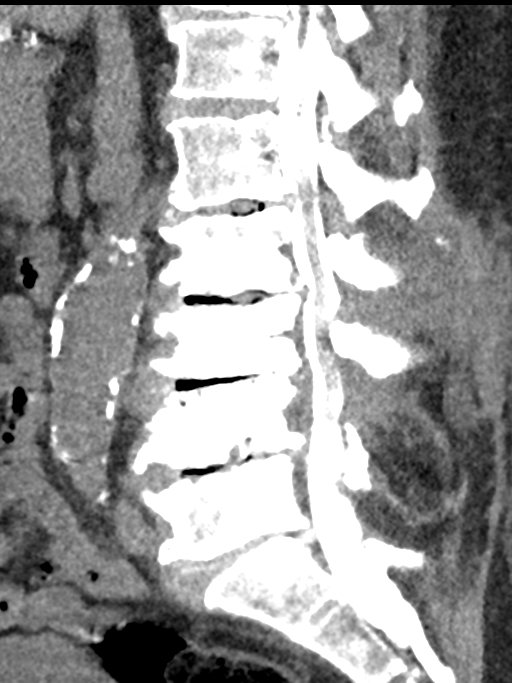
[im 31/61  bone]
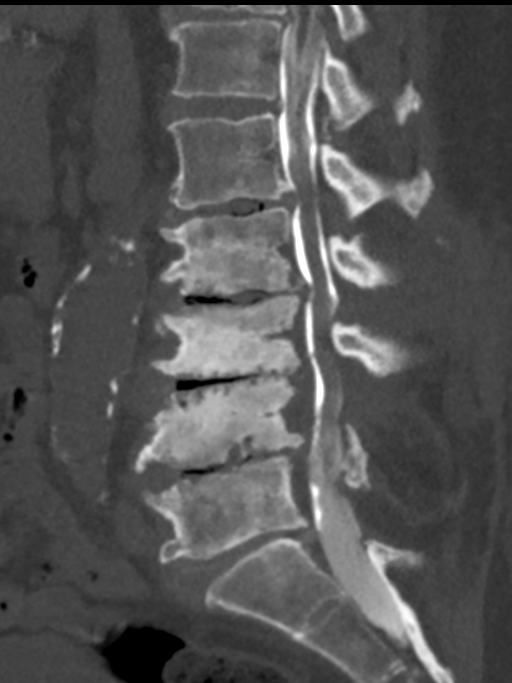
[im 36/61  bone]
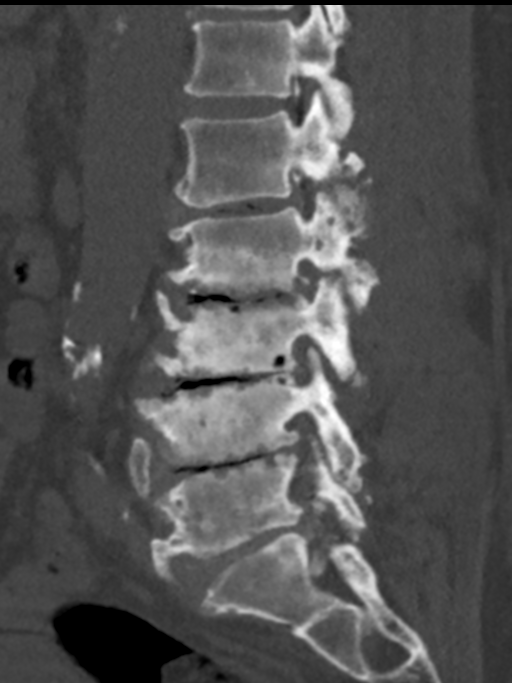
[im 41/61  bone]
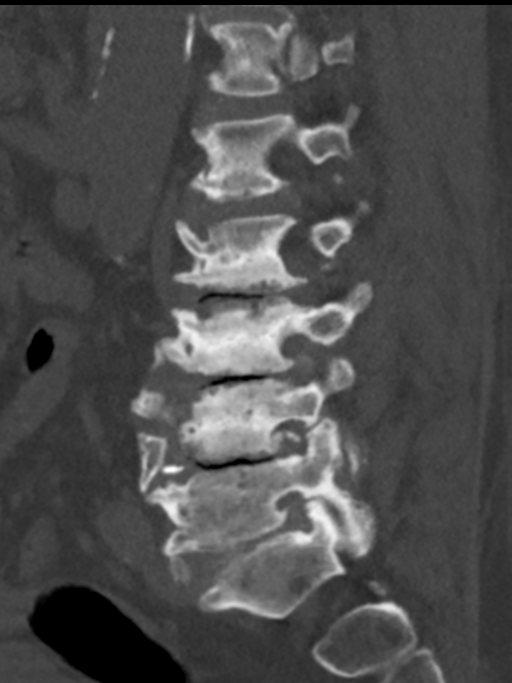

[10 of 33 positions shown; findings below may reference images not displayed]

FINDINGS: LUMBAR MYELOGRAM FINDINGS:

Straightening of lumbar lordosis with minimal to mild grade 1
anterolisthesis of L1 on L2 is stable. Straightening of the visible
lower thoracic spine. Severe lumbar vacuum disc and endplate
spurring except at L1-L2.

Based on the myelographic images, good intrathecal contrast
opacification was suspected. Severely effaced thecal sac lacking
intravenous contrast at L1-L2, and from L2 through the L5 level.
There is contrast in the sacral thecal sac.

No high-grade lower thoracic spinal stenosis is suspected.

With upright positioning the L1-L2 anterolisthesis is more apparent.
With flexion and extension imaging there is mild disproportionate
motion at L2-L3 (inflection) and L3-L4 (in extension). The
spondylolisthesis at L1-L2 appears constant.

CT LUMBAR MYELOGRAM FINDINGS:

Calcified aortoiliac atherosclerosis. Vascular patency is not
evaluated in the absence contrast. The infrarenal abdominal aorta
measures approximately 29 millimeters diameter. Negative visible
noncontrast abdominal viscera.

Mixed myelographic injection is evident by CT, with somewhat more
subdural then intrathecal contrast present.

The conus medullaris is evident at the mid T12 level and serves to
demonstrate the combined intra and extra thecal contrast (sagittal
image 32).

No acute osseous abnormality identified. Degenerative sclerosis of
the L2 through L5 vertebrae. Intact visible sacrum and SI joints.

T11-T12: Disc space loss with circumferential disc bulge. Lobulated
right paracentral/right lateral recess component as seen on series
4, image 4. Superimposed severe facet hypertrophy with vacuum
phenomena greater on the left. Ligament flavum hypertrophy.

Subsequent severe right lateral recess stenosis and mild spinal
stenosis. Partially visible moderate to severe right T11 foraminal
stenosis. This level appears stable since the [facility] MRI.

T12-L1: Circumferential disc bulge eccentric to the right. Mild to
moderate facet and ligament flavum hypertrophy, with trace vacuum
facet on the left. No spinal stenosis when allowing for subdural
contrast. Mild left and moderate right T12 foraminal stenosis is
stable from the [facility] MRI.

L1-L2: Moderate to severe disc space loss with vacuum disc. Bulky
circumferential disc bulge with posterior and biforaminal
involvement. Moderate facet and ligament flavum hypertrophy. Mild to
moderate spinal stenosis with mild bilateral lateral recess
stenosis. Moderate appearing bilateral L1 foraminal stenosis. This
level appears stable

L2-L3: Severe disc space loss with vacuum disc. Bulky
circumferential disc osteophyte complex with broad-based posterior
and biforaminal involvement. Bilateral laminotomies since the
[facility] MRI, and allowing for the subdural contrast injection,
spinal stenosis here appears resolved. Moderate bilateral L2
foraminal stenosis appears stable to improved.

L3-L4: Severe disc space loss with vacuum disc and bulky
circumferential disc osteophyte complex. Bilateral laminotomy since
the [facility] MRI. Allowing for the subdural contrast injection,
spinal stenosis here appears resolved since [REDACTED]. Mild to
moderate bilateral L3 foraminal stenosis also appears improved.

L4-L5: Severe disc space loss with vacuum disc and bulky
circumferential disc osteophyte complex. Chronic previous posterior
decompression. Moderate residual facet hypertrophy. No spinal
stenosis. There is mild right lateral recess stenosis, in part due
to right posterior element hypertrophy (series 4, image 76). Severe
left and moderate to severe right L4 foraminal stenosis appears
stable, and in large part due to bony spurring.

L5-S1: Disc space loss with chronic bulky circumferential disc
osteophyte complex. Chronic prior posterior decompression. Moderate
residual facet hypertrophy. No spinal stenosis. Borderline to mild
lateral recess stenosis is stable. Severe bilateral L5 foraminal
stenosis is stable.
IMPRESSION: LUMBAR MYELOGRAM IMPRESSION:

1. Widespread severe lumbar disc space loss with vacuum disc and
degenerative vertebral sclerosis.
2. Mild anterolisthesis of L1 on L2 with no abnormal motion in
flexion/extension.
3. However, there does appear to be disproportionate spinal motion
at both L2-L3 and L3-L4, mild.

CT LUMBAR MYELOGRAM IMPRESSION:

1. Mixed contrast injection demonstrated by CT, although difficult
to perceive on the plain myelographic images.
2. Resolved spinal stenosis at L2-L3 and L3-L4 since the [facility] MRI
following laminotomies at those levels.
3. Stable up to moderate multifactorial spinal stenosis at L1-L2.
4. Chronic postoperative changes at L4-L5 and L5-S1 are stable with
no spinal stenosis, only mild lateral recess stenosis, but moderate
to severe bilateral foraminal stenosis at both levels.
5. T11-T12 severe right lateral recess stenosis, mild spinal
stenosis, and moderate to severe right foraminal stenosis are stable
since [DATE]. Aortic Atherosclerosis ([id number].[id number]). Ectatic abdominal aorta at
risk for aneurysm development. Recommend followup by ultrasound in 5
years. This recommendation follows ACR consensus guidelines: White
Paper of the ACR Incidental Findings Committee II on Vascular
Findings. [HOSPITAL] 3000; [DATE].

## 2021-01-22 DIAGNOSIS — R69 Illness, unspecified: Secondary | ICD-10-CM | POA: Diagnosis not present

## 2021-01-22 DIAGNOSIS — Z299 Encounter for prophylactic measures, unspecified: Secondary | ICD-10-CM | POA: Diagnosis not present

## 2021-01-22 DIAGNOSIS — Z79899 Other long term (current) drug therapy: Secondary | ICD-10-CM | POA: Diagnosis not present

## 2021-01-22 DIAGNOSIS — R5383 Other fatigue: Secondary | ICD-10-CM | POA: Diagnosis not present

## 2021-01-22 DIAGNOSIS — Z0000 Encounter for general adult medical examination without abnormal findings: Secondary | ICD-10-CM | POA: Diagnosis not present

## 2021-01-22 DIAGNOSIS — Z7189 Other specified counseling: Secondary | ICD-10-CM | POA: Diagnosis not present

## 2021-01-22 DIAGNOSIS — Z6829 Body mass index (BMI) 29.0-29.9, adult: Secondary | ICD-10-CM | POA: Diagnosis not present

## 2021-01-22 DIAGNOSIS — Z1331 Encounter for screening for depression: Secondary | ICD-10-CM | POA: Diagnosis not present

## 2021-01-22 DIAGNOSIS — Z1339 Encounter for screening examination for other mental health and behavioral disorders: Secondary | ICD-10-CM | POA: Diagnosis not present

## 2021-01-22 DIAGNOSIS — E1165 Type 2 diabetes mellitus with hyperglycemia: Secondary | ICD-10-CM | POA: Diagnosis not present

## 2021-01-22 DIAGNOSIS — Z1211 Encounter for screening for malignant neoplasm of colon: Secondary | ICD-10-CM | POA: Diagnosis not present

## 2021-01-22 DIAGNOSIS — F17210 Nicotine dependence, cigarettes, uncomplicated: Secondary | ICD-10-CM | POA: Diagnosis not present

## 2021-01-27 DIAGNOSIS — L281 Prurigo nodularis: Secondary | ICD-10-CM | POA: Diagnosis not present

## 2021-01-27 DIAGNOSIS — D485 Neoplasm of uncertain behavior of skin: Secondary | ICD-10-CM | POA: Diagnosis not present

## 2021-01-27 DIAGNOSIS — L280 Lichen simplex chronicus: Secondary | ICD-10-CM | POA: Diagnosis not present

## 2021-01-27 DIAGNOSIS — L308 Other specified dermatitis: Secondary | ICD-10-CM | POA: Diagnosis not present

## 2021-01-27 DIAGNOSIS — L0100 Impetigo, unspecified: Secondary | ICD-10-CM | POA: Diagnosis not present

## 2021-02-26 DIAGNOSIS — L2082 Flexural eczema: Secondary | ICD-10-CM | POA: Diagnosis not present

## 2021-02-26 DIAGNOSIS — L280 Lichen simplex chronicus: Secondary | ICD-10-CM | POA: Diagnosis not present

## 2021-05-18 DIAGNOSIS — L280 Lichen simplex chronicus: Secondary | ICD-10-CM | POA: Diagnosis not present

## 2021-06-11 DIAGNOSIS — L2089 Other atopic dermatitis: Secondary | ICD-10-CM | POA: Diagnosis not present

## 2021-07-09 DIAGNOSIS — E1165 Type 2 diabetes mellitus with hyperglycemia: Secondary | ICD-10-CM | POA: Diagnosis not present

## 2021-07-09 DIAGNOSIS — R5383 Other fatigue: Secondary | ICD-10-CM | POA: Diagnosis not present

## 2021-07-09 DIAGNOSIS — Z6830 Body mass index (BMI) 30.0-30.9, adult: Secondary | ICD-10-CM | POA: Diagnosis not present

## 2021-07-09 DIAGNOSIS — Z299 Encounter for prophylactic measures, unspecified: Secondary | ICD-10-CM | POA: Diagnosis not present

## 2021-07-09 DIAGNOSIS — I700 Atherosclerosis of aorta: Secondary | ICD-10-CM | POA: Diagnosis not present

## 2021-11-02 DIAGNOSIS — I700 Atherosclerosis of aorta: Secondary | ICD-10-CM | POA: Diagnosis not present

## 2021-11-02 DIAGNOSIS — M545 Low back pain, unspecified: Secondary | ICD-10-CM | POA: Diagnosis not present

## 2021-11-02 DIAGNOSIS — E1165 Type 2 diabetes mellitus with hyperglycemia: Secondary | ICD-10-CM | POA: Diagnosis not present

## 2021-11-02 DIAGNOSIS — Z299 Encounter for prophylactic measures, unspecified: Secondary | ICD-10-CM | POA: Diagnosis not present

## 2022-01-19 DIAGNOSIS — H25813 Combined forms of age-related cataract, bilateral: Secondary | ICD-10-CM | POA: Diagnosis not present

## 2022-01-19 DIAGNOSIS — E119 Type 2 diabetes mellitus without complications: Secondary | ICD-10-CM | POA: Diagnosis not present

## 2022-01-19 DIAGNOSIS — H02413 Mechanical ptosis of bilateral eyelids: Secondary | ICD-10-CM | POA: Diagnosis not present

## 2022-03-16 DIAGNOSIS — Z713 Dietary counseling and surveillance: Secondary | ICD-10-CM | POA: Diagnosis not present

## 2022-03-16 DIAGNOSIS — E119 Type 2 diabetes mellitus without complications: Secondary | ICD-10-CM | POA: Diagnosis not present

## 2022-03-16 DIAGNOSIS — J069 Acute upper respiratory infection, unspecified: Secondary | ICD-10-CM | POA: Diagnosis not present

## 2022-03-16 DIAGNOSIS — R69 Illness, unspecified: Secondary | ICD-10-CM | POA: Diagnosis not present

## 2022-03-16 DIAGNOSIS — Z6830 Body mass index (BMI) 30.0-30.9, adult: Secondary | ICD-10-CM | POA: Diagnosis not present

## 2022-03-16 DIAGNOSIS — Z299 Encounter for prophylactic measures, unspecified: Secondary | ICD-10-CM | POA: Diagnosis not present

## 2022-03-16 DIAGNOSIS — F17210 Nicotine dependence, cigarettes, uncomplicated: Secondary | ICD-10-CM | POA: Diagnosis not present

## 2022-03-16 DIAGNOSIS — E1165 Type 2 diabetes mellitus with hyperglycemia: Secondary | ICD-10-CM | POA: Diagnosis not present

## 2022-03-16 DIAGNOSIS — M4306 Spondylolysis, lumbar region: Secondary | ICD-10-CM | POA: Diagnosis not present

## 2023-04-06 ENCOUNTER — Other Ambulatory Visit (HOSPITAL_COMMUNITY): Payer: Self-pay | Admitting: Neurological Surgery

## 2023-04-06 DIAGNOSIS — Z6829 Body mass index (BMI) 29.0-29.9, adult: Secondary | ICD-10-CM | POA: Diagnosis not present

## 2023-04-06 DIAGNOSIS — M21372 Foot drop, left foot: Secondary | ICD-10-CM | POA: Diagnosis not present

## 2023-04-09 ENCOUNTER — Encounter (HOSPITAL_COMMUNITY): Payer: Self-pay

## 2023-04-09 ENCOUNTER — Emergency Department (HOSPITAL_COMMUNITY)
Admission: RE | Admit: 2023-04-09 | Discharge: 2023-04-09 | Disposition: A | Discharge status: Home / Self Care | Payer: Medicare HMO | Source: Ambulatory Visit | Attending: Neurological Surgery | Admitting: Neurological Surgery

## 2023-04-09 ENCOUNTER — Other Ambulatory Visit: Payer: Self-pay

## 2023-04-09 ENCOUNTER — Emergency Department (HOSPITAL_COMMUNITY)
Admission: EM | Admit: 2023-04-09 | Discharge: 2023-04-09 | Disposition: A | Discharge status: Home / Self Care | Payer: Medicare HMO | Attending: Emergency Medicine | Admitting: Emergency Medicine

## 2023-04-09 DIAGNOSIS — T508X5A Adverse effect of diagnostic agents, initial encounter: Secondary | ICD-10-CM | POA: Diagnosis not present

## 2023-04-09 DIAGNOSIS — T7840XA Allergy, unspecified, initial encounter: Secondary | ICD-10-CM | POA: Diagnosis not present

## 2023-04-09 DIAGNOSIS — M48061 Spinal stenosis, lumbar region without neurogenic claudication: Secondary | ICD-10-CM | POA: Diagnosis not present

## 2023-04-09 DIAGNOSIS — M5136 Other intervertebral disc degeneration, lumbar region: Secondary | ICD-10-CM | POA: Diagnosis not present

## 2023-04-09 DIAGNOSIS — M21372 Foot drop, left foot: Secondary | ICD-10-CM | POA: Insufficient documentation

## 2023-04-09 DIAGNOSIS — R220 Localized swelling, mass and lump, head: Secondary | ICD-10-CM | POA: Diagnosis not present

## 2023-04-09 DIAGNOSIS — M5135 Other intervertebral disc degeneration, thoracolumbar region: Secondary | ICD-10-CM | POA: Diagnosis not present

## 2023-04-09 DIAGNOSIS — M47815 Spondylosis without myelopathy or radiculopathy, thoracolumbar region: Secondary | ICD-10-CM | POA: Diagnosis not present

## 2023-04-09 MED ORDER — METHYLPREDNISOLONE SODIUM SUCC 125 MG IJ SOLR
125.0000 mg | Freq: Once | INTRAMUSCULAR | Status: AC
  Administered 2023-04-09: 125 mg via INTRAVENOUS
  Filled 2023-04-09: qty 2

## 2023-04-09 MED ORDER — DIPHENHYDRAMINE HCL 50 MG/ML IJ SOLN
25.0000 mg | Freq: Once | INTRAMUSCULAR | Status: AC
  Administered 2023-04-09: 25 mg via INTRAVENOUS
  Filled 2023-04-09: qty 1

## 2023-04-09 MED ORDER — PREDNISONE 20 MG PO TABS: ORAL_TABLET | ORAL | 0 refills | Status: DC

## 2023-04-09 MED ORDER — FAMOTIDINE IN NACL 20-0.9 MG/50ML-% IV SOLN
20.0000 mg | Freq: Once | INTRAVENOUS | Status: AC
  Administered 2023-04-09: 20 mg via INTRAVENOUS
  Filled 2023-04-09: qty 50

## 2023-04-09 MED ORDER — FAMOTIDINE 20 MG PO TABS: 20.0000 mg | ORAL_TABLET | Freq: Two times a day (BID) | ORAL | 0 refills | Status: DC

## 2023-04-09 MED ORDER — GADOBUTROL 1 MMOL/ML IV SOLN
10.0000 mL | Freq: Once | INTRAVENOUS | Status: AC | PRN
  Administered 2023-04-09: 10 mL via INTRAVENOUS

## 2023-04-09 MED ORDER — DIPHENHYDRAMINE HCL 50 MG/ML IJ SOLN
50.0000 mg | Freq: Once | INTRAMUSCULAR | Status: AC
  Administered 2023-04-09: 50 mg via INTRAVENOUS
  Filled 2023-04-09: qty 1

## 2023-04-09 NOTE — Discharge Instructions (Signed)
Take been Benadryl 25 mg every 4-6 hours for any swelling or itching.  Follow-up with your family doctor this week

## 2023-04-09 NOTE — Progress Notes (Signed)
Pt received 10ml Gadavist ( lot#[id number] exp 05/2027) hand injected for MRI procedure, shortly after pt began to have itching, hives, and swelling to face.  Pt was brought to ED for observation/monitoring

## 2023-04-09 NOTE — ED Triage Notes (Signed)
Pt was getting MRI today with contrast.   Noted swelling to face, brought over to ED  Pt denies SOB No swelling noted to mouth Noted swelling around both eyes and hives to LEFT side of face and chest.   Pt denies pain, complains of itching.

## 2023-04-09 NOTE — ED Notes (Signed)
Pt's swelling in much improved around eyes. Still denies respiratory distress, shortness of breath, or swelling of throat. Wife remains at bedside at present.

## 2023-04-09 NOTE — ED Notes (Signed)
Pt remains stable in no acute distress.

## 2023-04-09 NOTE — ED Provider Notes (Signed)
[place] EMERGENCY DEPARTMENT AT [facility] Provider Note   CSN: [id number] Arrival date & time: 04/09/23  1701     History {Add pertinent medical, surgical, social history, OB history to HPI:1} Chief Complaint  Patient presents with   Allergic Reaction    [name] [name] is a 72 y.o. male.  Patient was getting an MRI with contrast that he started swelling to his face with itching.  Patient has history of headaches   Allergic Reaction      Home Medications Prior to Admission medications   Medication Sig Start Date End Date Taking? Authorizing Provider  famotidine (PEPCID) 20 MG tablet Take 1 tablet (20 mg total) by mouth 2 (two) times daily. 04/09/23  Yes Bethann Berkshire, MD  gabapentin (NEURONTIN) 300 MG capsule Take 300 mg by mouth 3 (three) times daily.   Yes [provider]  predniSONE (DELTASONE) 20 MG tablet 2 tabs po daily x 3 days 04/09/23  Yes Bethann Berkshire, MD  vitamin B-12 (CYANOCOBALAMIN) 1000 MCG tablet Take 1,000 mcg by mouth daily.   Yes [provider]  vitamin E 100 UNIT capsule Take 100 Units by mouth daily.   Yes [provider]      Allergies    Gadavist [gadobutrol] and Iodinated contrast media    Review of Systems   Review of Systems  Physical Exam Updated Vital Signs BP (!) 147/84   Pulse 76   Temp 98.3 F (36.8 C) (Oral)   Resp 16   Ht 5\' 10"  (1.778 m)   Wt 94.8 kg   SpO2 94%   BMI 29.99 kg/m  Physical Exam  ED Results / Procedures / Treatments   Labs (all labs ordered are listed, but only abnormal results are displayed) Labs Reviewed - No data to display  EKG None  Radiology No results found.  Procedures Procedures  {Document cardiac monitor, telemetry assessment procedure when appropriate:1}  Medications Ordered in ED Medications  methylPREDNISolone sodium succinate (SOLU-MEDROL) 125 mg/2 mL injection 125 mg (125 mg Intravenous Given 04/09/23 1723)  diphenhydrAMINE (BENADRYL) injection  50 mg (50 mg Intravenous Given 04/09/23 1722)  famotidine (PEPCID) IVPB 20 mg premix (0 mg Intravenous Stopped 04/09/23 1831)  diphenhydrAMINE (BENADRYL) injection 25 mg (25 mg Intravenous Given 04/09/23 1831)    ED Course/ Medical Decision Making/ A&P  Patient with allergic reaction to MRI.  Patient improved with Benadryl steroids and Pepcid and will be discharged home {   Click here for ABCD2, HEART and other calculatorsREFRESH Note before signing :1}                              Medical Decision Making Risk Prescription drug management.   Patient was ordered reaction to contrast for MRI.  She is sent home with steroids Pepcid and Benadryl and will follow-up with PCP  {Document critical care time when appropriate:1} {Document review of labs and clinical decision tools ie heart score, Chads2Vasc2 etc:1}  {Document your independent review of radiology images, and any outside records:1} {Document your discussion with family members, caretakers, and with consultants:1} {Document social determinants of health affecting pt's care:1} {Document your decision making why or why not admission, treatments were needed:1} Final Clinical Impression(s) / ED Diagnoses Final diagnoses:  Allergic reaction, initial encounter    Rx / DC Orders ED Discharge Orders          Ordered    predniSONE (DELTASONE) 20 MG tablet  04/09/23 1918    famotidine (PEPCID) 20 MG tablet  2 times daily        04/09/23 1918

## 2023-04-14 ENCOUNTER — Telehealth: Payer: Self-pay

## 2023-04-14 NOTE — Telephone Encounter (Signed)
Transition Care Management Unsuccessful Follow-up Telephone Call  Date of discharge and from where:  04/09/2023 [facility]  Attempts:  1st Attempt  Reason for unsuccessful TCM follow-up call:  Left voice message  Jaxsyn Catalfamo [facility]  [facility] Population Health Community Resource Care Guide   [email or web address]  ?? [phone]   Website: [email or web address]  [email or web address]

## 2023-04-15 ENCOUNTER — Telehealth: Payer: Self-pay

## 2023-04-15 NOTE — Telephone Encounter (Signed)
Transition Care Management Unsuccessful Follow-up Telephone Call  Date of discharge and from where:  04/09/2023 [facility]  Attempts:  2nd Attempt  Reason for unsuccessful TCM follow-up call:  Left voice message  Shatina Streets [facility]  [facility] Population Health Community Resource Care Guide   [email or web address]  ?? [phone]   Website: [email or web address]  [email or web address]

## 2023-04-22 DIAGNOSIS — Z6828 Body mass index (BMI) 28.0-28.9, adult: Secondary | ICD-10-CM | POA: Diagnosis not present

## 2023-04-22 DIAGNOSIS — M4714 Other spondylosis with myelopathy, thoracic region: Secondary | ICD-10-CM | POA: Diagnosis not present

## 2023-05-04 DIAGNOSIS — R278 Other lack of coordination: Secondary | ICD-10-CM | POA: Diagnosis not present

## 2023-05-04 DIAGNOSIS — M79605 Pain in left leg: Secondary | ICD-10-CM | POA: Diagnosis not present

## 2023-05-04 DIAGNOSIS — Z9181 History of falling: Secondary | ICD-10-CM | POA: Diagnosis not present

## 2023-05-04 DIAGNOSIS — R29898 Other symptoms and signs involving the musculoskeletal system: Secondary | ICD-10-CM | POA: Diagnosis not present

## 2023-05-04 DIAGNOSIS — R262 Difficulty in walking, not elsewhere classified: Secondary | ICD-10-CM | POA: Diagnosis not present

## 2023-05-04 DIAGNOSIS — M545 Low back pain, unspecified: Secondary | ICD-10-CM | POA: Diagnosis not present

## 2023-05-04 DIAGNOSIS — M21372 Foot drop, left foot: Secondary | ICD-10-CM | POA: Diagnosis not present

## 2023-05-04 DIAGNOSIS — R2689 Other abnormalities of gait and mobility: Secondary | ICD-10-CM | POA: Diagnosis not present

## 2023-05-04 DIAGNOSIS — Z7409 Other reduced mobility: Secondary | ICD-10-CM | POA: Diagnosis not present

## 2023-05-11 DIAGNOSIS — Z1339 Encounter for screening examination for other mental health and behavioral disorders: Secondary | ICD-10-CM | POA: Diagnosis not present

## 2023-05-11 DIAGNOSIS — E7800 Pure hypercholesterolemia, unspecified: Secondary | ICD-10-CM | POA: Diagnosis not present

## 2023-05-11 DIAGNOSIS — Z6828 Body mass index (BMI) 28.0-28.9, adult: Secondary | ICD-10-CM | POA: Diagnosis not present

## 2023-05-11 DIAGNOSIS — I700 Atherosclerosis of aorta: Secondary | ICD-10-CM | POA: Diagnosis not present

## 2023-05-11 DIAGNOSIS — Z0000 Encounter for general adult medical examination without abnormal findings: Secondary | ICD-10-CM | POA: Diagnosis not present

## 2023-05-11 DIAGNOSIS — E1165 Type 2 diabetes mellitus with hyperglycemia: Secondary | ICD-10-CM | POA: Diagnosis not present

## 2023-05-11 DIAGNOSIS — Z1211 Encounter for screening for malignant neoplasm of colon: Secondary | ICD-10-CM | POA: Diagnosis not present

## 2023-05-11 DIAGNOSIS — Z7189 Other specified counseling: Secondary | ICD-10-CM | POA: Diagnosis not present

## 2023-05-11 DIAGNOSIS — Z79899 Other long term (current) drug therapy: Secondary | ICD-10-CM | POA: Diagnosis not present

## 2023-05-11 DIAGNOSIS — Z1331 Encounter for screening for depression: Secondary | ICD-10-CM | POA: Diagnosis not present

## 2023-05-11 DIAGNOSIS — Z299 Encounter for prophylactic measures, unspecified: Secondary | ICD-10-CM | POA: Diagnosis not present

## 2023-05-11 DIAGNOSIS — R5383 Other fatigue: Secondary | ICD-10-CM | POA: Diagnosis not present

## 2023-05-11 DIAGNOSIS — Z125 Encounter for screening for malignant neoplasm of prostate: Secondary | ICD-10-CM | POA: Diagnosis not present

## 2023-05-18 DIAGNOSIS — R278 Other lack of coordination: Secondary | ICD-10-CM | POA: Diagnosis not present

## 2023-05-18 DIAGNOSIS — R262 Difficulty in walking, not elsewhere classified: Secondary | ICD-10-CM | POA: Diagnosis not present

## 2023-05-18 DIAGNOSIS — R29898 Other symptoms and signs involving the musculoskeletal system: Secondary | ICD-10-CM | POA: Diagnosis not present

## 2023-05-18 DIAGNOSIS — M21372 Foot drop, left foot: Secondary | ICD-10-CM | POA: Diagnosis not present

## 2023-05-18 DIAGNOSIS — M79605 Pain in left leg: Secondary | ICD-10-CM | POA: Diagnosis not present

## 2023-05-18 DIAGNOSIS — Z9181 History of falling: Secondary | ICD-10-CM | POA: Diagnosis not present

## 2023-05-18 DIAGNOSIS — R2689 Other abnormalities of gait and mobility: Secondary | ICD-10-CM | POA: Diagnosis not present

## 2023-05-18 DIAGNOSIS — M545 Low back pain, unspecified: Secondary | ICD-10-CM | POA: Diagnosis not present

## 2023-05-18 DIAGNOSIS — Z7409 Other reduced mobility: Secondary | ICD-10-CM | POA: Diagnosis not present

## 2023-05-23 DIAGNOSIS — R278 Other lack of coordination: Secondary | ICD-10-CM | POA: Diagnosis not present

## 2023-05-23 DIAGNOSIS — M21372 Foot drop, left foot: Secondary | ICD-10-CM | POA: Diagnosis not present

## 2023-05-23 DIAGNOSIS — R262 Difficulty in walking, not elsewhere classified: Secondary | ICD-10-CM | POA: Diagnosis not present

## 2023-05-23 DIAGNOSIS — R2689 Other abnormalities of gait and mobility: Secondary | ICD-10-CM | POA: Diagnosis not present

## 2023-05-23 DIAGNOSIS — M545 Low back pain, unspecified: Secondary | ICD-10-CM | POA: Diagnosis not present

## 2023-05-23 DIAGNOSIS — R29898 Other symptoms and signs involving the musculoskeletal system: Secondary | ICD-10-CM | POA: Diagnosis not present

## 2023-05-23 DIAGNOSIS — Z9181 History of falling: Secondary | ICD-10-CM | POA: Diagnosis not present

## 2023-05-23 DIAGNOSIS — Z7409 Other reduced mobility: Secondary | ICD-10-CM | POA: Diagnosis not present

## 2023-05-23 DIAGNOSIS — M79605 Pain in left leg: Secondary | ICD-10-CM | POA: Diagnosis not present

## 2023-05-25 DIAGNOSIS — M545 Low back pain, unspecified: Secondary | ICD-10-CM | POA: Diagnosis not present

## 2023-05-25 DIAGNOSIS — R29898 Other symptoms and signs involving the musculoskeletal system: Secondary | ICD-10-CM | POA: Diagnosis not present

## 2023-05-25 DIAGNOSIS — Z7409 Other reduced mobility: Secondary | ICD-10-CM | POA: Diagnosis not present

## 2023-05-25 DIAGNOSIS — Z9181 History of falling: Secondary | ICD-10-CM | POA: Diagnosis not present

## 2023-05-25 DIAGNOSIS — R278 Other lack of coordination: Secondary | ICD-10-CM | POA: Diagnosis not present

## 2023-05-25 DIAGNOSIS — R2689 Other abnormalities of gait and mobility: Secondary | ICD-10-CM | POA: Diagnosis not present

## 2023-05-25 DIAGNOSIS — M79605 Pain in left leg: Secondary | ICD-10-CM | POA: Diagnosis not present

## 2023-05-25 DIAGNOSIS — R262 Difficulty in walking, not elsewhere classified: Secondary | ICD-10-CM | POA: Diagnosis not present

## 2023-05-25 DIAGNOSIS — M21372 Foot drop, left foot: Secondary | ICD-10-CM | POA: Diagnosis not present

## 2023-06-01 DIAGNOSIS — Z9181 History of falling: Secondary | ICD-10-CM | POA: Diagnosis not present

## 2023-06-01 DIAGNOSIS — R262 Difficulty in walking, not elsewhere classified: Secondary | ICD-10-CM | POA: Diagnosis not present

## 2023-06-01 DIAGNOSIS — I96 Gangrene, not elsewhere classified: Secondary | ICD-10-CM | POA: Diagnosis not present

## 2023-06-01 DIAGNOSIS — E876 Hypokalemia: Secondary | ICD-10-CM | POA: Diagnosis not present

## 2023-06-01 DIAGNOSIS — Z89432 Acquired absence of left foot: Secondary | ICD-10-CM | POA: Diagnosis not present

## 2023-06-01 DIAGNOSIS — R29898 Other symptoms and signs involving the musculoskeletal system: Secondary | ICD-10-CM | POA: Diagnosis not present

## 2023-06-01 DIAGNOSIS — Z888 Allergy status to other drugs, medicaments and biological substances status: Secondary | ICD-10-CM | POA: Diagnosis not present

## 2023-06-01 DIAGNOSIS — I739 Peripheral vascular disease, unspecified: Secondary | ICD-10-CM | POA: Diagnosis not present

## 2023-06-01 DIAGNOSIS — S91302A Unspecified open wound, left foot, initial encounter: Secondary | ICD-10-CM | POA: Diagnosis not present

## 2023-06-01 DIAGNOSIS — Z91041 Radiographic dye allergy status: Secondary | ICD-10-CM | POA: Diagnosis not present

## 2023-06-01 DIAGNOSIS — M545 Low back pain, unspecified: Secondary | ICD-10-CM | POA: Diagnosis not present

## 2023-06-01 DIAGNOSIS — I10 Essential (primary) hypertension: Secondary | ICD-10-CM | POA: Diagnosis not present

## 2023-06-01 DIAGNOSIS — M7989 Other specified soft tissue disorders: Secondary | ICD-10-CM | POA: Diagnosis not present

## 2023-06-01 DIAGNOSIS — E119 Type 2 diabetes mellitus without complications: Secondary | ICD-10-CM | POA: Diagnosis not present

## 2023-06-01 DIAGNOSIS — Z79899 Other long term (current) drug therapy: Secondary | ICD-10-CM | POA: Diagnosis not present

## 2023-06-01 DIAGNOSIS — E1151 Type 2 diabetes mellitus with diabetic peripheral angiopathy without gangrene: Secondary | ICD-10-CM | POA: Diagnosis not present

## 2023-06-01 DIAGNOSIS — R278 Other lack of coordination: Secondary | ICD-10-CM | POA: Diagnosis not present

## 2023-06-01 DIAGNOSIS — E11621 Type 2 diabetes mellitus with foot ulcer: Secondary | ICD-10-CM | POA: Diagnosis not present

## 2023-06-01 DIAGNOSIS — E11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: Secondary | ICD-10-CM | POA: Diagnosis not present

## 2023-06-01 DIAGNOSIS — F17210 Nicotine dependence, cigarettes, uncomplicated: Secondary | ICD-10-CM | POA: Diagnosis not present

## 2023-06-01 DIAGNOSIS — E118 Type 2 diabetes mellitus with unspecified complications: Secondary | ICD-10-CM | POA: Diagnosis not present

## 2023-06-01 DIAGNOSIS — R030 Elevated blood-pressure reading, without diagnosis of hypertension: Secondary | ICD-10-CM | POA: Diagnosis not present

## 2023-06-01 DIAGNOSIS — Z792 Long term (current) use of antibiotics: Secondary | ICD-10-CM | POA: Diagnosis not present

## 2023-06-01 DIAGNOSIS — M79605 Pain in left leg: Secondary | ICD-10-CM | POA: Diagnosis not present

## 2023-06-01 DIAGNOSIS — M21372 Foot drop, left foot: Secondary | ICD-10-CM | POA: Diagnosis not present

## 2023-06-01 DIAGNOSIS — Z7409 Other reduced mobility: Secondary | ICD-10-CM | POA: Diagnosis not present

## 2023-06-01 DIAGNOSIS — I70222 Atherosclerosis of native arteries of extremities with rest pain, left leg: Secondary | ICD-10-CM | POA: Diagnosis not present

## 2023-06-01 DIAGNOSIS — R2689 Other abnormalities of gait and mobility: Secondary | ICD-10-CM | POA: Diagnosis not present

## 2023-06-01 DIAGNOSIS — F17200 Nicotine dependence, unspecified, uncomplicated: Secondary | ICD-10-CM | POA: Diagnosis not present

## 2023-06-01 DIAGNOSIS — I70203 Unspecified atherosclerosis of native arteries of extremities, bilateral legs: Secondary | ICD-10-CM | POA: Diagnosis not present

## 2023-06-01 DIAGNOSIS — M79675 Pain in left toe(s): Secondary | ICD-10-CM | POA: Diagnosis not present

## 2023-06-06 ENCOUNTER — Other Ambulatory Visit (HOSPITAL_COMMUNITY): Payer: Self-pay | Admitting: Neurological Surgery

## 2023-06-06 DIAGNOSIS — M4714 Other spondylosis with myelopathy, thoracic region: Secondary | ICD-10-CM

## 2023-06-07 ENCOUNTER — Observation Stay (HOSPITAL_COMMUNITY)
Admission: RE | Admit: 2023-06-07 | Discharge: 2023-06-09 | Disposition: A | Discharge status: Home / Self Care | Payer: Medicare HMO | Source: Other Acute Inpatient Hospital | Attending: Internal Medicine | Admitting: Internal Medicine

## 2023-06-07 ENCOUNTER — Encounter (HOSPITAL_COMMUNITY): Payer: Self-pay

## 2023-06-07 DIAGNOSIS — K219 Gastro-esophageal reflux disease without esophagitis: Secondary | ICD-10-CM | POA: Diagnosis present

## 2023-06-07 DIAGNOSIS — E119 Type 2 diabetes mellitus without complications: Secondary | ICD-10-CM | POA: Diagnosis not present

## 2023-06-07 DIAGNOSIS — E876 Hypokalemia: Secondary | ICD-10-CM | POA: Diagnosis present

## 2023-06-07 DIAGNOSIS — Z79899 Other long term (current) drug therapy: Secondary | ICD-10-CM | POA: Insufficient documentation

## 2023-06-07 DIAGNOSIS — E785 Hyperlipidemia, unspecified: Secondary | ICD-10-CM | POA: Diagnosis present

## 2023-06-07 DIAGNOSIS — I70222 Atherosclerosis of native arteries of extremities with rest pain, left leg: Secondary | ICD-10-CM | POA: Diagnosis present

## 2023-06-07 DIAGNOSIS — D649 Anemia, unspecified: Secondary | ICD-10-CM | POA: Diagnosis present

## 2023-06-07 DIAGNOSIS — Z720 Tobacco use: Secondary | ICD-10-CM | POA: Diagnosis present

## 2023-06-07 DIAGNOSIS — I10 Essential (primary) hypertension: Secondary | ICD-10-CM | POA: Diagnosis present

## 2023-06-07 DIAGNOSIS — I739 Peripheral vascular disease, unspecified: Principal | ICD-10-CM | POA: Diagnosis present

## 2023-06-07 LAB — COMPREHENSIVE METABOLIC PANEL
ALT: 16 U/L (ref 0–44)
AST: 23 U/L (ref 15–41)
Albumin: 3.3 g/dL — ABNORMAL LOW (ref 3.5–5.0)
Alkaline Phosphatase: 86 U/L (ref 38–126)
Anion gap: 13 (ref 5–15)
BUN: 10 mg/dL (ref 8–23)
CO2: 22 mmol/L (ref 22–32)
Calcium: 9.1 mg/dL (ref 8.9–10.3)
Chloride: 101 mmol/L (ref 98–111)
Creatinine, Ser: 0.76 mg/dL (ref 0.61–1.24)
GFR, Estimated: >60 mL/min (ref >60)
Glucose, Bld: 107 mg/dL — ABNORMAL HIGH (ref 70–99)
Potassium: 3.4 mmol/L — ABNORMAL LOW (ref 3.5–5.1)
Sodium: 136 mmol/L (ref 135–145)
Total Bilirubin: 0.6 mg/dL (ref 0.3–1.2)
Total Protein: 7.8 g/dL (ref 6.5–8.1)

## 2023-06-07 LAB — CBC
HCT: 33.3 % — ABNORMAL LOW (ref 39.0–52.0)
Hemoglobin: 11.2 g/dL — ABNORMAL LOW (ref 13.0–17.0)
MCH: 28.5 pg (ref 26.0–34.0)
MCHC: 33.6 g/dL (ref 30.0–36.0)
MCV: 84.7 fL (ref 80.0–100.0)
Platelets: 335 10*3/uL (ref 150–400)
RBC: 3.93 MIL/uL — ABNORMAL LOW (ref 4.22–5.81)
RDW: 14.2 % (ref 11.5–15.5)
WBC: 8.6 10*3/uL (ref 4.0–10.5)
nRBC: 0 % (ref 0.0–0.2)

## 2023-06-07 LAB — PROTIME-INR
INR: 1 (ref 0.8–1.2)
Prothrombin Time: 13.5 s (ref 11.4–15.2)

## 2023-06-07 LAB — HEMOGLOBIN A1C
Hgb A1c MFr Bld: 6.7 % — ABNORMAL HIGH (ref 4.8–5.6)
Mean Plasma Glucose: 145.59 mg/dL

## 2023-06-07 MED ORDER — PANTOPRAZOLE SODIUM 40 MG PO TBEC
40.0000 mg | DELAYED_RELEASE_TABLET | Freq: Every day | ORAL | Status: DC
  Administered 2023-06-09: 40 mg via ORAL
  Filled 2023-06-07: qty 1

## 2023-06-07 MED ORDER — ACETAMINOPHEN 325 MG PO TABS
650.0000 mg | ORAL_TABLET | Freq: Four times a day (QID) | ORAL | Status: DC | PRN
  Administered 2023-06-08: 650 mg via ORAL
  Filled 2023-06-07: qty 2

## 2023-06-07 MED ORDER — POTASSIUM CHLORIDE CRYS ER 20 MEQ PO TBCR
20.0000 meq | EXTENDED_RELEASE_TABLET | Freq: Once | ORAL | Status: AC
  Administered 2023-06-07: 40 meq via ORAL
  Filled 2023-06-07: qty 2

## 2023-06-07 MED ORDER — ONDANSETRON HCL 4 MG/2ML IJ SOLN: 4.0000 mg | Freq: Four times a day (QID) | INTRAMUSCULAR | Status: DC | PRN

## 2023-06-07 MED ORDER — INSULIN ASPART 100 UNIT/ML IJ SOLN: 0.0000 [IU] | Freq: Three times a day (TID) | INTRAMUSCULAR | Status: DC

## 2023-06-07 MED ORDER — ALUM & MAG HYDROXIDE-SIMETH 200-200-20 MG/5ML PO SUSP: 15.0000 mL | ORAL | Status: DC | PRN

## 2023-06-07 MED ORDER — HEPARIN SODIUM (PORCINE) 5000 UNIT/ML IJ SOLN
5000.0000 [IU] | Freq: Three times a day (TID) | INTRAMUSCULAR | Status: DC
  Administered 2023-06-07 – 2023-06-09 (×6): 5000 [IU] via SUBCUTANEOUS
  Filled 2023-06-07 (×6): qty 1

## 2023-06-07 MED ORDER — LABETALOL HCL 5 MG/ML IV SOLN
10.0000 mg | INTRAVENOUS | Status: DC | PRN
  Administered 2023-06-08: 10 mg via INTRAVENOUS
  Filled 2023-06-07: qty 4

## 2023-06-07 MED ORDER — PHENOL 1.4 % MT LIQD: 1.0000 | OROMUCOSAL | Status: DC | PRN

## 2023-06-07 MED ORDER — METOPROLOL TARTRATE 5 MG/5ML IV SOLN: 2.0000 mg | INTRAVENOUS | Status: DC | PRN

## 2023-06-07 MED ORDER — HYDRALAZINE HCL 20 MG/ML IJ SOLN: 5.0000 mg | INTRAMUSCULAR | Status: DC | PRN

## 2023-06-07 MED ORDER — GUAIFENESIN-DM 100-10 MG/5ML PO SYRP: 15.0000 mL | ORAL_SOLUTION | ORAL | Status: DC | PRN

## 2023-06-08 ENCOUNTER — Other Ambulatory Visit: Payer: Self-pay

## 2023-06-08 ENCOUNTER — Encounter (HOSPITAL_COMMUNITY)
Admission: RE | Disposition: A | Discharge status: Home / Self Care | Payer: Self-pay | Source: Other Acute Inpatient Hospital | Attending: Internal Medicine

## 2023-06-08 ENCOUNTER — Encounter (HOSPITAL_COMMUNITY): Payer: Self-pay

## 2023-06-08 DIAGNOSIS — I70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: Secondary | ICD-10-CM

## 2023-06-08 DIAGNOSIS — E119 Type 2 diabetes mellitus without complications: Secondary | ICD-10-CM

## 2023-06-08 DIAGNOSIS — K219 Gastro-esophageal reflux disease without esophagitis: Secondary | ICD-10-CM | POA: Diagnosis not present

## 2023-06-08 DIAGNOSIS — E785 Hyperlipidemia, unspecified: Secondary | ICD-10-CM

## 2023-06-08 DIAGNOSIS — I10 Essential (primary) hypertension: Secondary | ICD-10-CM | POA: Diagnosis present

## 2023-06-08 DIAGNOSIS — Z720 Tobacco use: Secondary | ICD-10-CM

## 2023-06-08 DIAGNOSIS — I739 Peripheral vascular disease, unspecified: Secondary | ICD-10-CM | POA: Diagnosis not present

## 2023-06-08 DIAGNOSIS — E876 Hypokalemia: Secondary | ICD-10-CM | POA: Diagnosis not present

## 2023-06-08 DIAGNOSIS — D649 Anemia, unspecified: Secondary | ICD-10-CM

## 2023-06-08 DIAGNOSIS — I70222 Atherosclerosis of native arteries of extremities with rest pain, left leg: Secondary | ICD-10-CM | POA: Diagnosis not present

## 2023-06-08 HISTORY — PX: PERIPHERAL VASCULAR INTERVENTION: CATH118257

## 2023-06-08 HISTORY — PX: ABDOMINAL AORTOGRAM W/LOWER EXTREMITY: CATH118223

## 2023-06-08 LAB — SURGICAL PCR SCREEN
MRSA, PCR: NEGATIVE
Staphylococcus aureus: NEGATIVE

## 2023-06-08 LAB — GLUCOSE, CAPILLARY
Glucose-Capillary: 93 mg/dL (ref 70–99)
Glucose-Capillary: 104 mg/dL — ABNORMAL HIGH (ref 70–99)
Glucose-Capillary: 92 mg/dL (ref 70–99)
Glucose-Capillary: 89 mg/dL (ref 70–99)
Glucose-Capillary: 127 mg/dL — ABNORMAL HIGH (ref 70–99)

## 2023-06-08 SURGERY — ABDOMINAL AORTOGRAM W/LOWER EXTREMITY
Anesthesia: LOCAL

## 2023-06-08 MED ORDER — ONDANSETRON HCL 4 MG/2ML IJ SOLN: 4.0000 mg | Freq: Four times a day (QID) | INTRAMUSCULAR | Status: DC | PRN

## 2023-06-08 MED ORDER — LABETALOL HCL 5 MG/ML IV SOLN: 10.0000 mg | INTRAVENOUS | Status: DC | PRN

## 2023-06-08 MED ORDER — LABETALOL HCL 5 MG/ML IV SOLN
INTRAVENOUS | Status: AC
  Filled 2023-06-08: qty 4

## 2023-06-08 MED ORDER — SODIUM CHLORIDE 0.9 % WEIGHT BASED INFUSION: 1.0000 mL/kg/h | INTRAVENOUS | Status: DC

## 2023-06-08 MED ORDER — HYDRALAZINE HCL 20 MG/ML IJ SOLN: 5.0000 mg | INTRAMUSCULAR | Status: DC | PRN

## 2023-06-08 MED ORDER — FENTANYL CITRATE (PF) 100 MCG/2ML IJ SOLN
INTRAMUSCULAR | Status: AC
  Filled 2023-06-08: qty 2

## 2023-06-08 MED ORDER — CLOPIDOGREL BISULFATE 300 MG PO TABS
ORAL_TABLET | ORAL | Status: AC
  Filled 2023-06-08: qty 1

## 2023-06-08 MED ORDER — DIPHENHYDRAMINE HCL 50 MG/ML IJ SOLN
INTRAMUSCULAR | Status: DC | PRN
  Administered 2023-06-08: 50 mg via INTRAVENOUS

## 2023-06-08 MED ORDER — MIDAZOLAM HCL 2 MG/2ML IJ SOLN
INTRAMUSCULAR | Status: AC
  Filled 2023-06-08: qty 2

## 2023-06-08 MED ORDER — CLOPIDOGREL BISULFATE 75 MG PO TABS
75.0000 mg | ORAL_TABLET | Freq: Every day | ORAL | Status: DC
  Administered 2023-06-09: 75 mg via ORAL
  Filled 2023-06-08: qty 1

## 2023-06-08 MED ORDER — FAMOTIDINE 20 MG PO TABS: 40.0000 mg | ORAL_TABLET | ORAL | Status: AC

## 2023-06-08 MED ORDER — SODIUM CHLORIDE 0.9% FLUSH
3.0000 mL | Freq: Two times a day (BID) | INTRAVENOUS | Status: DC
  Administered 2023-06-09: 3 mL via INTRAVENOUS

## 2023-06-08 MED ORDER — SODIUM CHLORIDE 0.9% FLUSH: 3.0000 mL | INTRAVENOUS | Status: DC | PRN

## 2023-06-08 MED ORDER — OXYCODONE HCL 5 MG PO TABS: 5.0000 mg | ORAL_TABLET | ORAL | Status: DC | PRN

## 2023-06-08 MED ORDER — CLOPIDOGREL BISULFATE 300 MG PO TABS
ORAL_TABLET | ORAL | Status: DC | PRN
  Administered 2023-06-08: 300 mg via ORAL

## 2023-06-08 MED ORDER — HEPARIN SODIUM (PORCINE) 1000 UNIT/ML IJ SOLN
INTRAMUSCULAR | Status: AC
  Filled 2023-06-08: qty 10

## 2023-06-08 MED ORDER — DIPHENHYDRAMINE HCL 50 MG/ML IJ SOLN
INTRAMUSCULAR | Status: AC
  Filled 2023-06-08: qty 1

## 2023-06-08 MED ORDER — LIDOCAINE HCL (PF) 1 % IJ SOLN
INTRAMUSCULAR | Status: AC
  Filled 2023-06-08: qty 30

## 2023-06-08 MED ORDER — ACETAMINOPHEN 325 MG PO TABS
650.0000 mg | ORAL_TABLET | ORAL | Status: DC | PRN
  Administered 2023-06-08 – 2023-06-09 (×2): 650 mg via ORAL
  Filled 2023-06-08 (×2): qty 2

## 2023-06-08 MED ORDER — LABETALOL HCL 5 MG/ML IV SOLN
INTRAVENOUS | Status: DC | PRN
  Administered 2023-06-08: 20 mg via INTRAVENOUS

## 2023-06-08 MED ORDER — IODIXANOL 320 MG/ML IV SOLN
INTRAVENOUS | Status: DC | PRN
  Administered 2023-06-08: 135 mL via INTRA_ARTERIAL

## 2023-06-08 MED ORDER — SODIUM CHLORIDE 0.9 % IV SOLN: INTRAVENOUS | Status: DC

## 2023-06-08 MED ORDER — ASPIRIN 81 MG PO CHEW
81.0000 mg | CHEWABLE_TABLET | Freq: Every day | ORAL | Status: DC
  Administered 2023-06-09: 81 mg via ORAL
  Filled 2023-06-08: qty 1

## 2023-06-08 MED ORDER — SODIUM CHLORIDE 0.9 % IV SOLN: 250.0000 mL | INTRAVENOUS | Status: DC | PRN

## 2023-06-08 MED ORDER — MIDAZOLAM HCL 2 MG/2ML IJ SOLN
INTRAMUSCULAR | Status: DC | PRN
  Administered 2023-06-08: .5 mg via INTRAVENOUS
  Administered 2023-06-08: 1 mg via INTRAVENOUS

## 2023-06-08 MED ORDER — ATORVASTATIN CALCIUM 40 MG PO TABS
40.0000 mg | ORAL_TABLET | Freq: Every day | ORAL | Status: DC
  Administered 2023-06-09: 40 mg via ORAL
  Filled 2023-06-08: qty 1

## 2023-06-08 MED ORDER — HEPARIN SODIUM (PORCINE) 1000 UNIT/ML IJ SOLN
INTRAMUSCULAR | Status: DC | PRN
  Administered 2023-06-08 (×2): 3000 [IU] via INTRAVENOUS
  Administered 2023-06-08: 8000 [IU] via INTRAVENOUS

## 2023-06-08 MED ORDER — VITAMIN B-12 1000 MCG PO TABS
1000.0000 ug | ORAL_TABLET | Freq: Every day | ORAL | Status: DC
  Administered 2023-06-08 – 2023-06-09 (×2): 1000 ug via ORAL
  Filled 2023-06-08 (×2): qty 1

## 2023-06-08 MED ORDER — LIDOCAINE HCL (PF) 1 % IJ SOLN
INTRAMUSCULAR | Status: DC | PRN
  Administered 2023-06-08 (×2): 15 mL

## 2023-06-08 MED ORDER — HYDROCODONE-ACETAMINOPHEN 5-325 MG PO TABS: 1.0000 | ORAL_TABLET | Freq: Four times a day (QID) | ORAL | Status: DC | PRN

## 2023-06-08 MED ORDER — AMLODIPINE BESYLATE 5 MG PO TABS
5.0000 mg | ORAL_TABLET | Freq: Every day | ORAL | Status: DC
  Administered 2023-06-09: 5 mg via ORAL
  Filled 2023-06-08: qty 1

## 2023-06-08 MED ORDER — GABAPENTIN 300 MG PO CAPS
300.0000 mg | ORAL_CAPSULE | Freq: Three times a day (TID) | ORAL | Status: DC
  Administered 2023-06-08 – 2023-06-09 (×3): 300 mg via ORAL
  Filled 2023-06-08 (×3): qty 1

## 2023-06-08 MED ORDER — HEPARIN (PORCINE) IN NACL 1000-0.9 UT/500ML-% IV SOLN
INTRAVENOUS | Status: DC | PRN
  Administered 2023-06-08 (×2): 500 mL

## 2023-06-08 MED ORDER — METHYLPREDNISOLONE SODIUM SUCC 125 MG IJ SOLR: 125.0000 mg | INTRAMUSCULAR | Status: AC

## 2023-06-08 MED ORDER — INSULIN ASPART 100 UNIT/ML IJ SOLN: 0.0000 [IU] | Freq: Three times a day (TID) | INTRAMUSCULAR | Status: DC

## 2023-06-08 MED ORDER — DIPHENHYDRAMINE HCL 25 MG PO CAPS: 50.0000 mg | ORAL_CAPSULE | ORAL | Status: AC

## 2023-06-08 MED ORDER — FENTANYL CITRATE (PF) 100 MCG/2ML IJ SOLN
INTRAMUSCULAR | Status: DC | PRN
  Administered 2023-06-08 (×4): 50 ug via INTRAVENOUS

## 2023-06-08 SURGICAL SUPPLY — 27 items
BALLN MUSTANG 4X100X135 (BALLOONS) ×2
BALLN MUSTANG 8.0X40 135 (BALLOONS) ×2
BALLN MUSTANG 8X60X135 (BALLOONS) ×2
BALLOON MUSTANG 4X100X135 (BALLOONS) IMPLANT
BALLOON MUSTANG 8.0X40 135 (BALLOONS) IMPLANT
BALLOON MUSTANG 8X60X135 (BALLOONS) IMPLANT
CATH ACCU-VU SIZ PIG 5F 70CM (CATHETERS) IMPLANT
CATH OMNI FLUSH 5F 65CM (CATHETERS) IMPLANT
CATH QUICKCROSS .035X135CM (MICROCATHETER) IMPLANT
CLOSURE PERCLOSE PROSTYLE (VASCULAR PRODUCTS) IMPLANT
COVER DOME SNAP 22 D (MISCELLANEOUS) IMPLANT
GLIDEWIRE ADV .035X260CM (WIRE) IMPLANT
KIT ENCORE 26 ADVANTAGE (KITS) IMPLANT
KIT MICROPUNCTURE NIT STIFF (SHEATH) IMPLANT
PROTECTION STATION PRESSURIZED (MISCELLANEOUS) ×2
SET ATX-X65L (MISCELLANEOUS) IMPLANT
SHEATH BRITE TIP 7FR 35CM (SHEATH) IMPLANT
SHEATH BRITE TIP 8FR 35CM (SHEATH) IMPLANT
SHEATH CATAPULT 6FR 60 (SHEATH) IMPLANT
SHEATH PINNACLE 5F 10CM (SHEATH) IMPLANT
SHEATH PROBE COVER 6X72 (BAG) IMPLANT
STATION PROTECTION PRESSURIZED (MISCELLANEOUS) IMPLANT
STENT VIABAHN 9X10X120 (Permanent Stent) IMPLANT
STENT VIABAHNBX 10X39X135 (Permanent Stent) IMPLANT
TRAY PV CATH (CUSTOM PROCEDURE TRAY) ×2 IMPLANT
WIRE BENTSON .035X145CM (WIRE) IMPLANT
WIRE G V18X300CM (WIRE) IMPLANT

## 2023-06-08 NOTE — Plan of Care (Signed)
  Problem: Education: Goal: Ability to describe self-care measures that may prevent or decrease complications (Diabetes Survival Skills Education) will improve Outcome: Progressing Goal: Individualized Educational Video(s) Outcome: Progressing   Problem: Coping: Goal: Ability to adjust to condition or change in health will improve Outcome: Progressing   Problem: Fluid Volume: Goal: Ability to maintain a balanced intake and output will improve Outcome: Progressing   Problem: Health Behavior/Discharge Planning: Goal: Ability to identify and utilize available resources and services will improve Outcome: Progressing Goal: Ability to manage health-related needs will improve Outcome: Progressing   Problem: Metabolic: Goal: Ability to maintain appropriate glucose levels will improve Outcome: Progressing   Problem: Nutritional: Goal: Maintenance of adequate nutrition will improve Outcome: Progressing Goal: Progress toward achieving an optimal weight will improve Outcome: Progressing   Problem: Skin Integrity: Goal: Risk for impaired skin integrity will decrease Outcome: Progressing   Problem: Tissue Perfusion: Goal: Adequacy of tissue perfusion will improve Outcome: Progressing   Problem: Education: Goal: Knowledge of General Education information will improve Description: Including pain rating scale, medication(s)/side effects and non-pharmacologic comfort measures Outcome: Progressing   Problem: Health Behavior/Discharge Planning: Goal: Ability to manage health-related needs will improve Outcome: Progressing   Problem: Clinical Measurements: Goal: Ability to maintain clinical measurements within normal limits will improve Outcome: Progressing Goal: Will remain free from infection Outcome: Progressing Goal: Diagnostic test results will improve Outcome: Progressing Goal: Respiratory complications will improve Outcome: Progressing Goal: Cardiovascular complication will  be avoided Outcome: Progressing   Problem: Activity: Goal: Risk for activity intolerance will decrease Outcome: Progressing   Problem: Nutrition: Goal: Adequate nutrition will be maintained Outcome: Progressing   Problem: Elimination: Goal: Will not experience complications related to bowel motility Outcome: Progressing Goal: Will not experience complications related to urinary retention Outcome: Progressing   Problem: Pain Managment: Goal: General experience of comfort will improve Outcome: Progressing   Problem: Safety: Goal: Ability to remain free from injury will improve Outcome: Progressing   Problem: Skin Integrity: Goal: Risk for impaired skin integrity will decrease Outcome: Progressing   

## 2023-06-08 NOTE — H&P (Addendum)
H&P   Patient seen and examined in preop holding.  No complaints. No changes to medication history or physical exam since last seen by my partner Dr. Hetty Blend. After discussing the risks and benefits of left lower extremity angiogram with possible intervention for critical limb ischemia with tissue loss in the foot, Manning Charity elected to proceed.   Has had itching with prior administration of contrast with MRI.  Will get Benadryl prior to the procedure.  Victorino Sparrow MD     MRN #:  [id number]  History of Present Illness: This is a 72 y.o. 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  He denies any significant medical history although he does not have a primary care doctor.  He reports rest pain over the last 2 to 6 months as well as claudication of the left leg over the last few years but attributed it to sciatic nerve pain as he has had lumbar back issues and laminectomies.  Approximately 1 month ago he had increased pain in his left foot and specifically at the second toe where he developed a wound on the toe turned black and gangrenous.  He underwent toe amputation at [facility] about 1 week ago and due to poor with progression and ABI was obtained which demonstrated a ABI of 0 on the left.  He is a current smoker and reports a pack lasts him about 2 weeks.  He is not on aspirin or statin medication.   Past Medical History:  Diagnosis Date   Arthritis    Headache     Past Surgical History:  Procedure Laterality Date   APPENDECTOMY  1956   BACK SURGERY     COLONOSCOPY  1997   KNEE ARTHROSCOPY Left    LUMBAR LAMINECTOMY/DECOMPRESSION MICRODISCECTOMY Bilateral 10/18/2017   Procedure: Bilateral Lumbar Two-Three Lumbar Three-Four Laminotomies;  Surgeon: Barnett Abu, MD;  Location: [facility] OR;  Service: Neurosurgery;  Laterality: Bilateral;  Bilateral L2-3 L3-4 Laminotomies   NECK SURGERY  2001    Allergies  Allergen  Reactions   Gadavist [Gadobutrol] Hives, Itching and Swelling    After injection of MRI contrast Gadavist pt experienced facial itching, stuffy nose, eye swelling   Iodinated Contrast Media Hives    Prior to Admission medications   Medication Sig Start Date End Date Taking? Authorizing Provider  famotidine (PEPCID) 20 MG tablet Take 1 tablet (20 mg total) by mouth 2 (two) times daily. 04/09/23   Bethann Berkshire, MD  gabapentin (NEURONTIN) 300 MG capsule Take 300 mg by mouth 3 (three) times daily.    [provider]  predniSONE (DELTASONE) 20 MG tablet 2 tabs po daily x 3 days 04/09/23   Bethann Berkshire, MD  vitamin B-12 (CYANOCOBALAMIN) 1000 MCG tablet Take 1,000 mcg by mouth daily.    [provider]  vitamin E 100 UNIT capsule Take 100 Units by mouth daily.    [provider]    Social History   Socioeconomic History   Marital status: Widowed    Spouse name: Not on file   Number of children: Not on file   Years of education: Not on file   Highest education level: Not on file  Occupational History   Not on file  Tobacco Use   Smoking status: Every Day    Current packs/day: 0.25    Types: Cigarettes   Smokeless tobacco: Never  Vaping Use   Vaping status: Never Used  Substance and  Sexual Activity   Alcohol use: Yes    Comment: occ   Drug use: No   Sexual activity: Not on file  Other Topics Concern   Not on file  Social History Narrative   Not on file   Social Determinants of Health   Financial Resource Strain: Low Risk  (06/02/2023)   Received from [facility]   Overall Financial Resource Strain (CARDIA)    Difficulty of Paying Living Expenses: Not very hard  Food Insecurity: No Food Insecurity (06/08/2023)   Hunger Vital Sign    Worried About Running Out of Food in the Last Year: Never true    Ran Out of Food in the Last Year: Never true  Transportation Needs: No Transportation Needs (06/08/2023)   PRAPARE - Scientist, research (physical sciences) (Medical): No    Lack of Transportation (Non-Medical): No  Physical Activity: Not on file  Stress: Not on file  Social Connections: Not on file  Intimate Partner Violence: Not At Risk (06/08/2023)   Humiliation, Afraid, Rape, and Kick questionnaire    Fear of Current or Ex-Partner: No    Emotionally Abused: No    Physically Abused: No    Sexually Abused: No    Family History  Problem Relation Age of Onset   Stroke Mother    Cancer Father    Heart disease Sister    Cancer Sister     ROS: Otherwise negative unless mentioned in HPI  Physical Examination  Vitals:   06/08/23 0100 06/08/23 0359  BP: (!) 158/74 (!) 142/49  Pulse:  70  Resp:  18  Temp:  97.9 F (36.6 C)  SpO2:  98%   There is no height or weight on file to calculate BMI.  General: no acute distress Cardiac: hemodynamically stable, nontachycardic Pulm: normal work of breathing GI: non-tender, no pulsatile mass  Neuro: alert, no focal deficit Extremities: Bilateral lower extremities with 1+ edema.  Left second toe amputation with fibrinous exudate and pale tissue at base.  Superficial ulcer at base of left great toe Vascular: Palpable femoral pulses bilaterally, nonpalpable pedal's   CBC    Component Value Date/Time   WBC 8.6 06/07/2023 2233   RBC 3.93 (L) 06/07/2023 2233   HGB 11.2 (L) 06/07/2023 2233   HCT 33.3 (L) 06/07/2023 2233   PLT 335 06/07/2023 2233   MCV 84.7 06/07/2023 2233   MCH 28.5 06/07/2023 2233   MCHC 33.6 06/07/2023 2233   RDW 14.2 06/07/2023 2233    BMET    Component Value Date/Time   NA 136 06/07/2023 2233   K 3.4 (L) 06/07/2023 2233   CL 101 06/07/2023 2233   CO2 22 06/07/2023 2233   GLUCOSE 107 (H) 06/07/2023 2233   BUN 10 06/07/2023 2233   CREATININE 0.76 06/07/2023 2233   CALCIUM 9.1 06/07/2023 2233   GFRNONAA >60 06/07/2023 2233   GFRAA >60 10/18/2017 0919    COAGS: Lab Results  Component Value Date   INR 1.0 06/07/2023     Non-Invasive  Vascular Imaging:   ABI from outside hospital reported to be 0 on left    ASSESSMENT/PLAN: This is a 72 y.o. male presenting with chronic limb threatening ischemia who is status post left toe amputation with inadequate perfusion for healing.  We discussed his risk of wound progression which would necessitate higher amputation.  We discussed the risk and benefits of angiogram and he is willing to proceed. Left lower extremity angiogram today in the Cath  Lab N.p.o. Contrast allergy premedication ordered, to be given in preop Hospital medicine consult  Daria Pastures MD MS [facility] [phone] 06/08/2023  7:20 AM

## 2023-06-08 NOTE — Plan of Care (Signed)
  Problem: Education: Goal: Knowledge of General Education information will improve Description: Including pain rating scale, medication(s)/side effects and non-pharmacologic comfort measures Outcome: Progressing   Problem: Clinical Measurements: Goal: Will remain free from infection Outcome: Progressing   Problem: Nutrition: Goal: Adequate nutrition will be maintained Outcome: Progressing   

## 2023-06-08 NOTE — Progress Notes (Addendum)
Chaplain attempted to respond AD consultation. Pt was sleeping. Chaplain will attempt to revisit.   **[id number] Chaplain attempted to respond AD consultation but pt was unavailable. Chaplain will continue to follow up.   M.Kubra Judy Goodenow, MA Chaplain Intern [phone]

## 2023-06-08 NOTE — Op Note (Signed)
Patient name: [name] [name] MRN: [id number] DOB: [birth year] Sex: male  06/08/2023 Pre-operative Diagnosis: Left lower extremity critical limb ischemia with tissue loss Post-operative diagnosis:  Same Surgeon:  Victorino Sparrow, MD Procedure Performed: 1.  Ultrasound-guided micropuncture access of bilateral common femoral arteries in retrograde fashion 2.  Aortogram 3.  Left common iliac artery stenting, external iliac artery stenting 10 x 39 VBX, 9 x 10 cm Viabahn 4.  Second order cannulation, left lower extremity angiogram 5.  Moderate sedation time 26 minutes, contrast volume 135 mL   Indications: Patient is a 72 year old male who presented to the hospital with left lower extremity rest pain and wounds on the left foot.  He had nonpalpable pulse in the foot.  After discussing the risk and benefits of left lower extremity angiogram in an effort to define and improve distal perfusion for wound healing, [name] elected to proceed.  Findings:  Aortogram: Widely patent bilateral renal arteries, infrarenal aorta Right iliac system widely patent, left common iliac artery occluded, hypogastric artery occluded, proximal external iliac artery occluded with reconstitution distally.   Left leg: Widely patent common femoral artery, profunda, superficial femoral artery.  This extends into the popliteal artery with severe disease located P2 and subsequent occlusion and P3.  There are multiple collateral vessels with reconstitution of the posterior tibial artery which runs off into the foot via the plantar arteries.  The peroneal artery is diminutive.  The dorsalis pedis fills from peroneal collaterals.   Procedure:  The patient was identified in the holding area and taken to room 8.  The patient was then placed supine on the table and prepped and draped in the usual sterile fashion.  A time out was called.  Ultrasound was used to evaluate the right common femoral artery.  It was patent .  A digital  ultrasound image was acquired.  A micropuncture needle was used to access the right common femoral artery under ultrasound guidance.  An 018 wire was advanced without resistance and a micropuncture sheath was placed.  The 018 wire was removed and a benson wire was placed.  The micropuncture sheath was exchanged for a 5 french sheath.  An omniflush catheter was advanced over the wire to the level of L-1.  An abdominal angiogram was obtained.   Results above  I elected to attempt recanalization of the left side iliac system.  An ultrasound-guided micropuncture needle was used to access the left common femoral artery in retrograde fashion.  A wire was navigated through the occlusion and parked in the abdominal aorta.  A 5 French sheath was placed.  Next, angiography followed using a marker pigtail catheter.  The lesion was roughly 11 cm in length.  An 8 French sheath was brought onto the field and run over wire into the abdominal aorta.  A 4 mm balloon was used to create space within the occlusion.  Next, a 9 x 10 cm Viabahn stent was brought onto the field and deployed from the distal external iliac into the common iliac artery.  Next, a 10 x 39 mm VBX was brought onto the field and shingled into the prior Viabahn.  This VBX was landed at the aortic bifurcation.  The Viabahn was postdilated using an 8 x 40 mm balloon expanded beyond profile to 8.4 mm.  Follow-up angiography demonstrated excellent result with recanalization of the iliac system.  No flow-limiting stenosis appreciated.  I then moved to attempt recanalization of the left popliteal artery, and proximal posterior  tibial artery.  A 6 French by 60 cm sheath was brought onto the field and driven from the right groin into the left superficial femoral artery.  A series of wires and catheters were used, however could not cross the occlusion.  I considered retrograde pedal access, but with the size of the posterior tibial artery, I felt it would be best  treated with bypass surgery, which would have significantly more durability than balloon angioplasty.  At this point, I terminated the case.  Bilateral groins were closed using Pro-glide devices without issue.   Impression: Patient appears to have been a claudicant for years with recent acute onset rest pain and tissue loss.  I think that the left iliac occlusion was recent, and that this was thrombus.  The below-knee popliteal artery lesion appeared to be chronic.  Successful recanalization of the left iliac system using 10 x 39mm VBX, 9 x 10cm Viabahn Patient would benefit from left lower extremity femoral to posterior tibial artery bypass to provide inline flow to the foot for wound healing.   Victorino Sparrow MD [facility] Office: [phone]

## 2023-06-08 NOTE — Consult Note (Signed)
Initial Consultation Note   Patient: [name] [name] HQI:[id number] DOB: 04/04/1951 PCP: Ignatius Specking, MD DOA: 06/07/2023 DOS: the patient was seen and examined on 06/08/2023 Primary service: Daria Pastures, MD  Referring physician: Dr. Sherral Hammers, MD Reason for consult: Medical management/assume care   Assessment and Plan:  Critical limb ischemia Patient had been admitted for limb ischemia following left second great toe amputation last week.  Repeat ABIs were noted to be 0.  Vascular surgery took patient for arteriogram. -Admit to a progressive cardiac bed -Check lipid panel in a.m. -Continue aspirin, Plavix, statin -Appreciate vascular surgery consultative services we will follow-up for any further recommendations.  Essential hypertension Blood pressures range from 145/71-176/78.  Patient had not been on any blood pressure medications at home and reports normal blood pressures at baseline. -Start amlodipine 5 mg daily.  Continue to monitor and reassess at appropriate dosage -Labetalol IV as needed for elevated blood pressures  Normocytic anemia Chronic.  Labs from yesterday noted hemoglobin 11.2 g/dL which appears similar to prior on care everywhere records.   -Recheck CBC tomorrow morning.  Hypokalemia Acute.  Potassium 3.4.  Patient had been ordered potassium chloride 40 meq p.o. -Continue to monitor and replace as needed  Controlled diabetes mellitus type 2, with long-term use of insulin Hemoglobin A1c 6.7.  At baseline patient is not on any diabetic medications and controlled with diet. -Hypoglycemic protocols -Changed CBGs to before every meal with sensitive SSI  Hyperlipidemia -Follow-up lipid panel -Continue atorvastatin  Tobacco abuse Patient reports smoking a pack of cigarettes per week on average but has not smoked in over 10 days. -Encouraged continued cessation of tobacco use  GERD -Continue Protonix  TRH will continue to follow the patient.  HPI: [name] [name] is a 72 y.o. male with past medical history significant for tobacco abuse who presented as a transfer for chronic limb threatening ischemia.  He reports that he has had pain with ambulation and at rest that have been most noticeable in the last month.  He had been prescribed gabapentin as symptoms were initially thought secondary to his sciatic nerve as he reports prior history of lumbar back surgeries.  He had been seen at [facility] in [place] due to worsening pain in the left foot that had become unbearable.  His left second toe had turned black and gangrenous and he underwent second toe ray amputation approximately 1 week ago.  However patient had poor wound healing and repeat ABIs were obtained and noted to be 0 on the left.  Plan was for diagnostic arteriogram later today.  Patient does report smoking a pack of cigarettes per week on average, but has not smoked in the last 10 days.  Is not on blood thinners, does not take a daily aspirin, or on statin.  He reports routinely following with a primary care provider and denies any prior history of high blood pressure.  Patient did make note of prior history of reaction to contrast during MRI which patient had broken out in a rash with facial swelling.  Labs from yesterday noted hemoglobin 11.2 and potassium 3.4.   Review of Systems: As mentioned in the history of present illness. All other systems reviewed and are negative. Past Medical History:  Diagnosis Date   Arthritis    Headache    Past Surgical History:  Procedure Laterality Date   APPENDECTOMY  1956   BACK SURGERY     COLONOSCOPY  1997   KNEE ARTHROSCOPY Left  LUMBAR LAMINECTOMY/DECOMPRESSION MICRODISCECTOMY Bilateral 10/18/2017   Procedure: Bilateral Lumbar Two-Three Lumbar Three-Four Laminotomies;  Surgeon: Barnett Abu, MD;  Location: [facility] OR;  Service: Neurosurgery;  Laterality: Bilateral;  Bilateral L2-3 L3-4 Laminotomies   NECK SURGERY  2001   Social History:  reports  that he has been smoking cigarettes. He has never used smokeless tobacco. He reports current alcohol use. He reports that he does not use drugs.  Allergies  Allergen Reactions   Gadavist [Gadobutrol] Hives, Itching and Swelling    After injection of MRI contrast Gadavist pt experienced facial itching, stuffy nose, eye swelling   Iodinated Contrast Media Hives    Family History  Problem Relation Age of Onset   Stroke Mother    Cancer Father    Heart disease Sister    Cancer Sister     Prior to Admission medications   Medication Sig Start Date End Date Taking? Authorizing Provider  famotidine (PEPCID) 20 MG tablet Take 1 tablet (20 mg total) by mouth 2 (two) times daily. 04/09/23   Bethann Berkshire, MD  gabapentin (NEURONTIN) 300 MG capsule Take 300 mg by mouth 3 (three) times daily.    [provider]  predniSONE (DELTASONE) 20 MG tablet 2 tabs po daily x 3 days 04/09/23   Bethann Berkshire, MD  vitamin B-12 (CYANOCOBALAMIN) 1000 MCG tablet Take 1,000 mcg by mouth daily.    [provider]  vitamin E 100 UNIT capsule Take 100 Units by mouth daily.    [provider]    Physical Exam: Vitals:   06/07/23 2342 06/08/23 0100 06/08/23 0359 06/08/23 0803  BP: (!) 176/78 (!) 158/74 (!) 142/49 (!) 145/71  Pulse: 71  70 77  Resp: 18  18 18   Temp: 98.5 F (36.9 C)  97.9 F (36.6 C) 98 F (36.7 C)  TempSrc: Oral  Oral Oral  SpO2: 100%  98% 99%   Constitutional: Elderly male currently no acute distress NAD, calm, comfortable Eyes: PERRL, lids and conjunctivae normal ENMT: Mucous membranes are moist.  Normal dentition.  Neck: normal, supple, no masses, no thyromegaly Respiratory: clear to auscultation bilaterally, no wheezing, no crackles. Normal respiratory effort. No accessory muscle use.  Cardiovascular: Regular rate and rhythm, no murmurs / rubs / gallops. No extremity edema. 2+ pedal pulses. No carotid bruits.  Abdomen: no tenderness, no masses palpated. No  hepatosplenomegaly. Bowel sounds positive.  Musculoskeletal: no clubbing / cyanosis.  Ray amputation of the left second digit. Skin: no rashes, lesions, ulcers. No induration Neurologic: CN 2-12 grossly intact. Sensation intact,   Strength 5/5 in all 4.  Psychiatric: Normal judgment and insight. Alert and oriented x 3. Normal mood.   Data Reviewed:   Reviewed labs, imaging, and pertinent records.   Family Communication: Significant other updated at bedside. Primary team communication:  Thank you very much for involving [place] in the care of your patient.  Author: Clydie Braun, MD 06/08/2023 8:09 AM  For on call review [email or web address].

## 2023-06-09 ENCOUNTER — Encounter (HOSPITAL_COMMUNITY): Payer: Self-pay | Admitting: Vascular Surgery

## 2023-06-09 ENCOUNTER — Observation Stay (HOSPITAL_COMMUNITY): Payer: Medicare HMO

## 2023-06-09 DIAGNOSIS — I10 Essential (primary) hypertension: Secondary | ICD-10-CM | POA: Diagnosis not present

## 2023-06-09 DIAGNOSIS — Z01818 Encounter for other preprocedural examination: Secondary | ICD-10-CM

## 2023-06-09 DIAGNOSIS — I739 Peripheral vascular disease, unspecified: Secondary | ICD-10-CM | POA: Diagnosis not present

## 2023-06-09 DIAGNOSIS — I70222 Atherosclerosis of native arteries of extremities with rest pain, left leg: Secondary | ICD-10-CM | POA: Diagnosis not present

## 2023-06-09 LAB — ECHOCARDIOGRAM COMPLETE
Area-P 1/2: 5.27 cm2
Calc EF: 55.1 %
Height: 67 in
S' Lateral: 3 cm
Single Plane A2C EF: 52.6 %
Single Plane A4C EF: 54.1 %
Weight: 3054.69 [oz_av]

## 2023-06-09 LAB — CBC
HCT: 34.8 % — ABNORMAL LOW (ref 39.0–52.0)
Hemoglobin: 11.8 g/dL — ABNORMAL LOW (ref 13.0–17.0)
MCH: 29.3 pg (ref 26.0–34.0)
MCHC: 33.9 g/dL (ref 30.0–36.0)
MCV: 86.4 fL (ref 80.0–100.0)
Platelets: 347 10*3/uL (ref 150–400)
RBC: 4.03 MIL/uL — ABNORMAL LOW (ref 4.22–5.81)
RDW: 14.2 % (ref 11.5–15.5)
WBC: 8.4 10*3/uL (ref 4.0–10.5)
nRBC: 0 % (ref 0.0–0.2)

## 2023-06-09 LAB — BASIC METABOLIC PANEL
Anion gap: 11 (ref 5–15)
BUN: 7 mg/dL — ABNORMAL LOW (ref 8–23)
CO2: 24 mmol/L (ref 22–32)
Calcium: 9.3 mg/dL (ref 8.9–10.3)
Chloride: 102 mmol/L (ref 98–111)
Creatinine, Ser: 0.81 mg/dL (ref 0.61–1.24)
GFR, Estimated: >60 mL/min (ref >60)
Glucose, Bld: 98 mg/dL (ref 70–99)
Potassium: 3.3 mmol/L — ABNORMAL LOW (ref 3.5–5.1)
Sodium: 137 mmol/L (ref 135–145)

## 2023-06-09 LAB — LIPID PANEL
Cholesterol: 152 mg/dL (ref 0–200)
HDL: 39 mg/dL — ABNORMAL LOW (ref >40)
LDL Cholesterol: 104 mg/dL — ABNORMAL HIGH (ref 0–99)
Total CHOL/HDL Ratio: 3.9 {ratio}
Triglycerides: 46 mg/dL (ref <150)
VLDL: 9 mg/dL (ref 0–40)

## 2023-06-09 LAB — GLUCOSE, CAPILLARY
Glucose-Capillary: 93 mg/dL (ref 70–99)
Glucose-Capillary: 89 mg/dL (ref 70–99)
Glucose-Capillary: 115 mg/dL — ABNORMAL HIGH (ref 70–99)

## 2023-06-09 LAB — MAGNESIUM: Magnesium: 2 mg/dL (ref 1.7–2.4)

## 2023-06-09 MED ORDER — POTASSIUM CHLORIDE CRYS ER 20 MEQ PO TBCR
40.0000 meq | EXTENDED_RELEASE_TABLET | Freq: Once | ORAL | Status: AC
  Administered 2023-06-09: 40 meq via ORAL
  Filled 2023-06-09: qty 2

## 2023-06-09 MED ORDER — LEVOFLOXACIN 500 MG PO TABS
500.0000 mg | ORAL_TABLET | Freq: Every day | ORAL | Status: DC
  Administered 2023-06-09: 500 mg via ORAL
  Filled 2023-06-09: qty 1

## 2023-06-09 MED ORDER — VITAMIN C 500 MG PO TABS
1000.0000 mg | ORAL_TABLET | Freq: Two times a day (BID) | ORAL | Status: DC
  Administered 2023-06-09: 1000 mg via ORAL
  Filled 2023-06-09: qty 2

## 2023-06-09 MED ORDER — ZINC SULFATE 220 (50 ZN) MG PO CAPS
220.0000 mg | ORAL_CAPSULE | Freq: Every day | ORAL | Status: DC
  Administered 2023-06-09: 220 mg via ORAL
  Filled 2023-06-09: qty 1

## 2023-06-09 MED ORDER — AMLODIPINE BESYLATE 5 MG PO TABS: 5.0000 mg | ORAL_TABLET | Freq: Every day | ORAL | 0 refills | Status: AC

## 2023-06-09 MED ORDER — CLOPIDOGREL BISULFATE 75 MG PO TABS: 75.0000 mg | ORAL_TABLET | Freq: Every day | ORAL | 1 refills | Status: DC

## 2023-06-09 MED ORDER — AMOXICILLIN-POT CLAVULANATE 875-125 MG PO TABS: 1.0000 | ORAL_TABLET | Freq: Two times a day (BID) | ORAL | 0 refills | Status: DC

## 2023-06-09 MED ORDER — AMOXICILLIN-POT CLAVULANATE 875-125 MG PO TABS
1.0000 | ORAL_TABLET | Freq: Two times a day (BID) | ORAL | Status: DC
  Administered 2023-06-09: 1 via ORAL
  Filled 2023-06-09 (×2): qty 1

## 2023-06-09 MED ORDER — HYDROCODONE-ACETAMINOPHEN 5-325 MG PO TABS: 1.0000 | ORAL_TABLET | Freq: Four times a day (QID) | ORAL | 0 refills | Status: DC | PRN

## 2023-06-09 MED ORDER — LEVOFLOXACIN 500 MG PO TABS: 500.0000 mg | ORAL_TABLET | Freq: Every day | ORAL | 0 refills | Status: DC

## 2023-06-09 MED ORDER — PANTOPRAZOLE SODIUM 40 MG PO TBEC: 40.0000 mg | DELAYED_RELEASE_TABLET | Freq: Every day | ORAL | 0 refills | Status: DC

## 2023-06-09 MED ORDER — ASPIRIN 81 MG PO CHEW: 81.0000 mg | CHEWABLE_TABLET | Freq: Every day | ORAL | Status: DC

## 2023-06-09 MED ORDER — ATORVASTATIN CALCIUM 40 MG PO TABS: 40.0000 mg | ORAL_TABLET | Freq: Every day | ORAL | 1 refills | Status: AC

## 2023-06-09 NOTE — TOC CM/SW Note (Signed)
Transition of Care [facility]) - Inpatient Brief Assessment   Patient Details  Name: [name] [name] MRN: [id number] Date of Birth: 04/27/1951  [facility] CM/SW Contact:    Gala Lewandowsky, RN Phone Number: 06/09/2023, 4:21 PM   Clinical Narrative: Patient presented for PAD. Case Manager received a message from [facility] and the patient is active for RN/PT. Will need resumption orders if patient is changed to inpatient status. Case Manager will continue to follow for transition of care needs.    Transition of Care Asessment: Insurance and Status: Insurance coverage has been reviewed Patient has primary care physician: Yes Prior/Current Home Services: No current home services Social Determinants of Health Reivew: SDOH reviewed no interventions necessary Readmission risk has been reviewed: Yes Transition of care needs: transition of care needs identified, TOC will continue to follow

## 2023-06-09 NOTE — Progress Notes (Signed)
BLE vein mapping has been completed.    Results can be found under chart review under CV PROC. 06/09/2023 12:44 PM Brahim Dolman RVT, RDMS

## 2023-06-09 NOTE — Progress Notes (Signed)
   06/09/23 1300  Spiritual Encounters  Type of Visit Attempt (pt unavailable)   Ch responded to request for AD. Pt's partner was at bedside but patient was en route to do a test. Ch will follow-up with pt.

## 2023-06-09 NOTE — Consult Note (Signed)
WOC Nurse Consult Note: Reason for Consult: toe wound 9/26 2nd ray amputation at outside facility. Severe PAD; ABIs reported to be 0.32 on the right and 0.00 on the left. Transferred to [facility] to receive vascular services.  Wound type:surgical  Pressure Injury POA:NA Measurement: see nursing flow sheet Wound bed: red Drainage (amount, consistency, odor) minimal  Periwound:intact  Dressing procedure/placement/frequency: Continue POC established Silver hydrofiber packing daily [place] # [phone]) Top with dry dressing  FU with vascular or surgeon who performed procedure post op after DC.  Discussed POC with patient and bedside nurse.  Re consult if needed, will not follow at this time. Thanks  Raeleen Winstanley [employer], RN,CWOCN, CNS, CWON-AP [phone])

## 2023-06-09 NOTE — Progress Notes (Signed)
  Daily Progress Note  S/p: LLE angiogram with L iliac stenting  Subjective: No complaints, B/L groins without hematoma  Objective: Vitals:   06/09/23 0545 06/09/23 0801  BP: (!) 161/75 127/72  Pulse: 79 66  Resp: 18 18  Temp: 98.8 F (37.1 C) 98.8 F (37.1 C)  SpO2: 90% 94%    Physical Examination General: no acute distress, sitting up on side of bed Cardiac: HDS, nontachycardic Extrmities: edema of BLE. L foot dressing c/d/I Vascular: palpable femoral pulses bilaterally  ASSESSMENT/PLAN:  his is a 72 y.o. male presenting with chronic limb threatening ischemia who is status post left toe amputation with inadequate perfusion for healing. Underwent angiogram on 10/2 with L iliac stenting and noting a L popliteal occlusion not amenable to endovascular therapy. We discussed his risk of wound progressing given his current perfusion which would likely lead to major amputation. We discussed the risks and benefits of SFA to PT bypass and he is will to proceed.  Plan for vein mapping today Continue local wound care for L 2nd toe amp site Tentatively planning for OR on Tuesday (06/14/23) Please obtain echo for cardiac risk stratification    Daria Pastures MD MS [facility] [phone] 06/09/2023  9:29 AM

## 2023-06-09 NOTE — Discharge Instructions (Signed)
STOP SMOKING!!!!!! 

## 2023-06-09 NOTE — Progress Notes (Signed)
Per chart review-- plan from [place] was to d/c patient on levaquin and augmentin based on interoperative wound culture     Will start abx and discuss with ID Pharmacist  Marlin Canary DO

## 2023-06-09 NOTE — H&P (View-Only) (Signed)
  Daily Progress Note  S/p: LLE angiogram with L iliac stenting  Subjective: No complaints, B/L groins without hematoma  Objective: Vitals:   06/09/23 0545 06/09/23 0801  BP: (!) 161/75 127/72  Pulse: 79 66  Resp: 18 18  Temp: 98.8 F (37.1 C) 98.8 F (37.1 C)  SpO2: 90% 94%    Physical Examination General: no acute distress, sitting up on side of bed Cardiac: HDS, nontachycardic Extrmities: edema of BLE. L foot dressing c/d/I Vascular: palpable femoral pulses bilaterally  ASSESSMENT/PLAN:  his is a 72 y.o. male presenting with chronic limb threatening ischemia who is status post left toe amputation with inadequate perfusion for healing. Underwent angiogram on 10/2 with L iliac stenting and noting a L popliteal occlusion not amenable to endovascular therapy. We discussed his risk of wound progressing given his current perfusion which would likely lead to major amputation. We discussed the risks and benefits of SFA to PT bypass and he is will to proceed.  Plan for vein mapping today Continue local wound care for L 2nd toe amp site Tentatively planning for OR on Tuesday (06/14/23) Please obtain echo for cardiac risk stratification    Daria Pastures MD MS [facility] [phone] 06/09/2023  9:29 AM

## 2023-06-09 NOTE — Discharge Summary (Signed)
Physician Discharge Summary  [name] [name] ZOX:[id number] DOB: [birth year] DOA: 06/07/2023  PCP: Ignatius Specking, MD  Admit date: 06/07/2023 Discharge date: 06/09/2023  Admitted From: home Discharge disposition: home   Recommendations for Outpatient Follow-Up:   Need to clarify with surgeon how long he needs abx for-- were his surgical margins clear?  Where is the bone biopsy from (I gave 2 weeks of oral abx to patient) I called the prior d/c'ing hospital but providers were not available Home health Wound care- supplies given for home-- surgeon to clarify if he needs a wound care center Good blood sugar control with diabetic diet-- patient knows to follow strict diet and may need medication started Smoking cessation Plan for bypass on 10/8-- office to call with directions On heel weight bearing-- patient has walker already   Discharge Diagnosis:   Principal Problem:   PAD (peripheral artery disease) (HCC) Active Problems:   Essential hypertension   Normocytic anemia   Hypokalemia   Controlled type 2 diabetes mellitus without complication, without long-term current use of insulin (HCC)   Hyperlipidemia   Tobacco abuse   GERD (gastroesophageal reflux disease)    Discharge Condition: Improved.  Diet recommendation: Low sodium, heart healthy.  Carbohydrate-modified.    Wound care: None.  Code status: Full.   History of Present Illness:   This is a 72 y.o. 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  He denies any significant medical history although he does not have a primary care doctor.  He reports rest pain over the last 2 to 6 months as well as claudication of the left leg over the last few years but attributed it to sciatic nerve pain as he has had lumbar back issues and laminectomies.  Approximately 1 month ago he had increased pain in his left foot and specifically at the second toe where he developed  a wound on the toe turned black and gangrenous.  He underwent toe amputation at [facility] about 1 week ago and due to poor with progression and ABI was obtained which demonstrated a ABI of 0 on the left.  He is a current smoker and reports a pack lasts him about 2 weeks.  He is not on aspirin or statin medication.     Hospital Course by Problem:   Critical limb ischemia Patient had been admitted for limb ischemia following left second great toe amputation last week.  Repeat ABIs were noted to be 0.  Vascular surgery took patient for arteriogram. -Continue aspirin, Plavix, statin -plan for bypass on Tuesday  Recent toe amputation with + bone culture    -unclear if margins from procedure were clear so length of therapy will need to be determined by surgeon (Dr. Marcha Solders)-- I have given about 18 days in prescription to cover until he can be seen by Dr. Marcha Solders-- if margins were not clear would refer to ID  -continue local wound care   Essential hypertension -Start amlodipine 5 mg daily.  Continue to monitor and adjust outpatient   Normocytic anemia -outpatient follow up.   Hypokalemia -replete   Controlled diabetes mellitus type 2, with long-term use of insulin Hemoglobin A1c 6.7.  At baseline patient is not on any diabetic medications and controlled with diet. -continue strict diet   Hyperlipidemia -Continue atorvastatin -follow LFTs outpatient   Tobacco abuse Patient reports smoking a pack of cigarettes per week on average but has not smoked in  Physician Discharge Summary  [name] [name] ZOX:[id number] DOB: [birth year] DOA: 06/07/2023  PCP: Ignatius Specking, MD  Admit date: 06/07/2023 Discharge date: 06/09/2023  Admitted From: home Discharge disposition: home   Recommendations for Outpatient Follow-Up:   Need to clarify with surgeon how long he needs abx for-- were his surgical margins clear?  Where is the bone biopsy from (I gave 2 weeks of oral abx to patient) I called the prior d/c'ing hospital but providers were not available Home health Wound care- supplies given for home-- surgeon to clarify if he needs a wound care center Good blood sugar control with diabetic diet-- patient knows to follow strict diet and may need medication started Smoking cessation Plan for bypass on 10/8-- office to call with directions On heel weight bearing-- patient has walker already   Discharge Diagnosis:   Principal Problem:   PAD (peripheral artery disease) (HCC) Active Problems:   Essential hypertension   Normocytic anemia   Hypokalemia   Controlled type 2 diabetes mellitus without complication, without long-term current use of insulin (HCC)   Hyperlipidemia   Tobacco abuse   GERD (gastroesophageal reflux disease)    Discharge Condition: Improved.  Diet recommendation: Low sodium, heart healthy.  Carbohydrate-modified.    Wound care: None.  Code status: Full.   History of Present Illness:   This is a 72 y.o. 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  He denies any significant medical history although he does not have a primary care doctor.  He reports rest pain over the last 2 to 6 months as well as claudication of the left leg over the last few years but attributed it to sciatic nerve pain as he has had lumbar back issues and laminectomies.  Approximately 1 month ago he had increased pain in his left foot and specifically at the second toe where he developed  a wound on the toe turned black and gangrenous.  He underwent toe amputation at [facility] about 1 week ago and due to poor with progression and ABI was obtained which demonstrated a ABI of 0 on the left.  He is a current smoker and reports a pack lasts him about 2 weeks.  He is not on aspirin or statin medication.     Hospital Course by Problem:   Critical limb ischemia Patient had been admitted for limb ischemia following left second great toe amputation last week.  Repeat ABIs were noted to be 0.  Vascular surgery took patient for arteriogram. -Continue aspirin, Plavix, statin -plan for bypass on Tuesday  Recent toe amputation with + bone culture    -unclear if margins from procedure were clear so length of therapy will need to be determined by surgeon (Dr. Marcha Solders)-- I have given about 18 days in prescription to cover until he can be seen by Dr. Marcha Solders-- if margins were not clear would refer to ID  -continue local wound care   Essential hypertension -Start amlodipine 5 mg daily.  Continue to monitor and adjust outpatient   Normocytic anemia -outpatient follow up.   Hypokalemia -replete   Controlled diabetes mellitus type 2, with long-term use of insulin Hemoglobin A1c 6.7.  At baseline patient is not on any diabetic medications and controlled with diet. -continue strict diet   Hyperlipidemia -Continue atorvastatin -follow LFTs outpatient   Tobacco abuse Patient reports smoking a pack of cigarettes per week on average but has not smoked in  over 10 days. -knows he must stop smoking   GERD -Continue Protonix    Medical Consultants:   vascular   Discharge Exam:   Vitals:   06/09/23 0801 06/09/23 1300  BP: 127/72 (!) 147/66  Pulse: 66   Resp: 18 18  Temp: 98.8 F (37.1 C) 99 F (37.2 C)  SpO2: 94% 100%   Vitals:   06/08/23 2053 06/09/23 0545 06/09/23 0801 06/09/23 1300  BP: (!) 156/76 (!) 161/75 127/72 (!) 147/66  Pulse:  79 66   Resp: 18 18 18 18    Temp: 99.1 F (37.3 C) 98.8 F (37.1 C) 98.8 F (37.1 C) 99 F (37.2 C)  TempSrc: Oral Oral Oral Oral  SpO2:  90% 94% 100%  Weight:      Height:        General exam: Appears calm and comfortable.   The results of significant diagnostics from this hospitalization (including imaging, microbiology, ancillary and laboratory) are listed below for reference.     Procedures and Diagnostic Studies:   PERIPHERAL VASCULAR CATHETERIZATION  Result Date: 06/08/2023 Images from the original result were not included. Patient name: [name] [name] MRN: [id number] DOB: 25-Jun-1951 Sex: male 06/08/2023 Pre-operative Diagnosis: Left lower extremity critical limb ischemia with tissue loss Post-operative diagnosis:  Same Surgeon:  Victorino Sparrow, MD Procedure Performed: 1.  Ultrasound-guided micropuncture access of bilateral common femoral arteries in retrograde fashion 2.  Aortogram 3.  Left common iliac artery stenting, external iliac artery stenting 10 x 39 VBX, 9 x 10 cm Viabahn 4.  Second order cannulation, left lower extremity angiogram 5.  Moderate sedation time 26 minutes, contrast volume 135 mL Indications: Patient is a 72 year old male who presented to the hospital with left lower extremity rest pain and wounds on the left foot.  He had nonpalpable pulse in the foot.  After discussing the risk and benefits of left lower extremity angiogram in an effort to define and improve distal perfusion for wound healing, Moses elected to proceed. Findings: Aortogram: Widely patent bilateral renal arteries, infrarenal aorta Right iliac system widely patent, left common iliac artery occluded, hypogastric artery occluded, proximal external iliac artery occluded with reconstitution distally. Left leg: Widely patent common femoral artery, profunda, superficial femoral artery.  This extends into the popliteal artery with severe disease located P2 and subsequent occlusion and P3.  There are multiple collateral vessels with  reconstitution of the posterior tibial artery which runs off into the foot via the plantar arteries.  The peroneal artery is diminutive.  The dorsalis pedis fills from peroneal collaterals.  Procedure:  The patient was identified in the holding area and taken to room 8.  The patient was then placed supine on the table and prepped and draped in the usual sterile fashion.  A time out was called.  Ultrasound was used to evaluate the right common femoral artery.  It was patent .  A digital ultrasound image was acquired.  A micropuncture needle was used to access the right common femoral artery under ultrasound guidance.  An 018 wire was advanced without resistance and a micropuncture sheath was placed.  The 018 wire was removed and a benson wire was placed.  The micropuncture sheath was exchanged for a 5 french sheath.  An omniflush catheter was advanced over the wire to the level of L-1.  An abdominal angiogram was obtained. Results above I elected to attempt recanalization of the left side iliac system.  An ultrasound-guided micropuncture needle was used to access the left  over 10 days. -knows he must stop smoking   GERD -Continue Protonix    Medical Consultants:   vascular   Discharge Exam:   Vitals:   06/09/23 0801 06/09/23 1300  BP: 127/72 (!) 147/66  Pulse: 66   Resp: 18 18  Temp: 98.8 F (37.1 C) 99 F (37.2 C)  SpO2: 94% 100%   Vitals:   06/08/23 2053 06/09/23 0545 06/09/23 0801 06/09/23 1300  BP: (!) 156/76 (!) 161/75 127/72 (!) 147/66  Pulse:  79 66   Resp: 18 18 18 18    Temp: 99.1 F (37.3 C) 98.8 F (37.1 C) 98.8 F (37.1 C) 99 F (37.2 C)  TempSrc: Oral Oral Oral Oral  SpO2:  90% 94% 100%  Weight:      Height:        General exam: Appears calm and comfortable.   The results of significant diagnostics from this hospitalization (including imaging, microbiology, ancillary and laboratory) are listed below for reference.     Procedures and Diagnostic Studies:   PERIPHERAL VASCULAR CATHETERIZATION  Result Date: 06/08/2023 Images from the original result were not included. Patient name: [name] [name] MRN: [id number] DOB: 25-Jun-1951 Sex: male 06/08/2023 Pre-operative Diagnosis: Left lower extremity critical limb ischemia with tissue loss Post-operative diagnosis:  Same Surgeon:  Victorino Sparrow, MD Procedure Performed: 1.  Ultrasound-guided micropuncture access of bilateral common femoral arteries in retrograde fashion 2.  Aortogram 3.  Left common iliac artery stenting, external iliac artery stenting 10 x 39 VBX, 9 x 10 cm Viabahn 4.  Second order cannulation, left lower extremity angiogram 5.  Moderate sedation time 26 minutes, contrast volume 135 mL Indications: Patient is a 72 year old male who presented to the hospital with left lower extremity rest pain and wounds on the left foot.  He had nonpalpable pulse in the foot.  After discussing the risk and benefits of left lower extremity angiogram in an effort to define and improve distal perfusion for wound healing, Moses elected to proceed. Findings: Aortogram: Widely patent bilateral renal arteries, infrarenal aorta Right iliac system widely patent, left common iliac artery occluded, hypogastric artery occluded, proximal external iliac artery occluded with reconstitution distally. Left leg: Widely patent common femoral artery, profunda, superficial femoral artery.  This extends into the popliteal artery with severe disease located P2 and subsequent occlusion and P3.  There are multiple collateral vessels with  reconstitution of the posterior tibial artery which runs off into the foot via the plantar arteries.  The peroneal artery is diminutive.  The dorsalis pedis fills from peroneal collaterals.  Procedure:  The patient was identified in the holding area and taken to room 8.  The patient was then placed supine on the table and prepped and draped in the usual sterile fashion.  A time out was called.  Ultrasound was used to evaluate the right common femoral artery.  It was patent .  A digital ultrasound image was acquired.  A micropuncture needle was used to access the right common femoral artery under ultrasound guidance.  An 018 wire was advanced without resistance and a micropuncture sheath was placed.  The 018 wire was removed and a benson wire was placed.  The micropuncture sheath was exchanged for a 5 french sheath.  An omniflush catheter was advanced over the wire to the level of L-1.  An abdominal angiogram was obtained. Results above I elected to attempt recanalization of the left side iliac system.  An ultrasound-guided micropuncture needle was used to access the left  over 10 days. -knows he must stop smoking   GERD -Continue Protonix    Medical Consultants:   vascular   Discharge Exam:   Vitals:   06/09/23 0801 06/09/23 1300  BP: 127/72 (!) 147/66  Pulse: 66   Resp: 18 18  Temp: 98.8 F (37.1 C) 99 F (37.2 C)  SpO2: 94% 100%   Vitals:   06/08/23 2053 06/09/23 0545 06/09/23 0801 06/09/23 1300  BP: (!) 156/76 (!) 161/75 127/72 (!) 147/66  Pulse:  79 66   Resp: 18 18 18 18    Temp: 99.1 F (37.3 C) 98.8 F (37.1 C) 98.8 F (37.1 C) 99 F (37.2 C)  TempSrc: Oral Oral Oral Oral  SpO2:  90% 94% 100%  Weight:      Height:        General exam: Appears calm and comfortable.   The results of significant diagnostics from this hospitalization (including imaging, microbiology, ancillary and laboratory) are listed below for reference.     Procedures and Diagnostic Studies:   PERIPHERAL VASCULAR CATHETERIZATION  Result Date: 06/08/2023 Images from the original result were not included. Patient name: [name] [name] MRN: [id number] DOB: 25-Jun-1951 Sex: male 06/08/2023 Pre-operative Diagnosis: Left lower extremity critical limb ischemia with tissue loss Post-operative diagnosis:  Same Surgeon:  Victorino Sparrow, MD Procedure Performed: 1.  Ultrasound-guided micropuncture access of bilateral common femoral arteries in retrograde fashion 2.  Aortogram 3.  Left common iliac artery stenting, external iliac artery stenting 10 x 39 VBX, 9 x 10 cm Viabahn 4.  Second order cannulation, left lower extremity angiogram 5.  Moderate sedation time 26 minutes, contrast volume 135 mL Indications: Patient is a 72 year old male who presented to the hospital with left lower extremity rest pain and wounds on the left foot.  He had nonpalpable pulse in the foot.  After discussing the risk and benefits of left lower extremity angiogram in an effort to define and improve distal perfusion for wound healing, Moses elected to proceed. Findings: Aortogram: Widely patent bilateral renal arteries, infrarenal aorta Right iliac system widely patent, left common iliac artery occluded, hypogastric artery occluded, proximal external iliac artery occluded with reconstitution distally. Left leg: Widely patent common femoral artery, profunda, superficial femoral artery.  This extends into the popliteal artery with severe disease located P2 and subsequent occlusion and P3.  There are multiple collateral vessels with  reconstitution of the posterior tibial artery which runs off into the foot via the plantar arteries.  The peroneal artery is diminutive.  The dorsalis pedis fills from peroneal collaterals.  Procedure:  The patient was identified in the holding area and taken to room 8.  The patient was then placed supine on the table and prepped and draped in the usual sterile fashion.  A time out was called.  Ultrasound was used to evaluate the right common femoral artery.  It was patent .  A digital ultrasound image was acquired.  A micropuncture needle was used to access the right common femoral artery under ultrasound guidance.  An 018 wire was advanced without resistance and a micropuncture sheath was placed.  The 018 wire was removed and a benson wire was placed.  The micropuncture sheath was exchanged for a 5 french sheath.  An omniflush catheter was advanced over the wire to the level of L-1.  An abdominal angiogram was obtained. Results above I elected to attempt recanalization of the left side iliac system.  An ultrasound-guided micropuncture needle was used to access the left

## 2023-06-10 ENCOUNTER — Other Ambulatory Visit: Payer: Self-pay

## 2023-06-10 DIAGNOSIS — Z7902 Long term (current) use of antithrombotics/antiplatelets: Secondary | ICD-10-CM | POA: Diagnosis not present

## 2023-06-10 DIAGNOSIS — I10 Essential (primary) hypertension: Secondary | ICD-10-CM | POA: Diagnosis not present

## 2023-06-10 DIAGNOSIS — E876 Hypokalemia: Secondary | ICD-10-CM | POA: Diagnosis not present

## 2023-06-10 DIAGNOSIS — I70222 Atherosclerosis of native arteries of extremities with rest pain, left leg: Secondary | ICD-10-CM | POA: Diagnosis not present

## 2023-06-10 DIAGNOSIS — E1151 Type 2 diabetes mellitus with diabetic peripheral angiopathy without gangrene: Secondary | ICD-10-CM | POA: Diagnosis not present

## 2023-06-10 DIAGNOSIS — I70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: Secondary | ICD-10-CM

## 2023-06-10 DIAGNOSIS — F17210 Nicotine dependence, cigarettes, uncomplicated: Secondary | ICD-10-CM | POA: Diagnosis not present

## 2023-06-10 DIAGNOSIS — Z4781 Encounter for orthopedic aftercare following surgical amputation: Secondary | ICD-10-CM | POA: Diagnosis not present

## 2023-06-10 DIAGNOSIS — Z7982 Long term (current) use of aspirin: Secondary | ICD-10-CM | POA: Diagnosis not present

## 2023-06-10 DIAGNOSIS — Z89422 Acquired absence of other left toe(s): Secondary | ICD-10-CM | POA: Diagnosis not present

## 2023-06-10 DIAGNOSIS — D649 Anemia, unspecified: Secondary | ICD-10-CM | POA: Diagnosis not present

## 2023-06-13 ENCOUNTER — Encounter (HOSPITAL_COMMUNITY): Payer: Self-pay | Admitting: Vascular Surgery

## 2023-06-13 ENCOUNTER — Other Ambulatory Visit: Payer: Self-pay

## 2023-06-13 DIAGNOSIS — I10 Essential (primary) hypertension: Secondary | ICD-10-CM | POA: Diagnosis not present

## 2023-06-13 DIAGNOSIS — D649 Anemia, unspecified: Secondary | ICD-10-CM | POA: Diagnosis not present

## 2023-06-13 DIAGNOSIS — E1151 Type 2 diabetes mellitus with diabetic peripheral angiopathy without gangrene: Secondary | ICD-10-CM | POA: Diagnosis not present

## 2023-06-13 DIAGNOSIS — Z4781 Encounter for orthopedic aftercare following surgical amputation: Secondary | ICD-10-CM | POA: Diagnosis not present

## 2023-06-13 DIAGNOSIS — E876 Hypokalemia: Secondary | ICD-10-CM | POA: Diagnosis not present

## 2023-06-13 DIAGNOSIS — Z89422 Acquired absence of other left toe(s): Secondary | ICD-10-CM | POA: Diagnosis not present

## 2023-06-13 DIAGNOSIS — Z7982 Long term (current) use of aspirin: Secondary | ICD-10-CM | POA: Diagnosis not present

## 2023-06-13 DIAGNOSIS — Z7902 Long term (current) use of antithrombotics/antiplatelets: Secondary | ICD-10-CM | POA: Diagnosis not present

## 2023-06-13 DIAGNOSIS — F17210 Nicotine dependence, cigarettes, uncomplicated: Secondary | ICD-10-CM | POA: Diagnosis not present

## 2023-06-13 DIAGNOSIS — I70222 Atherosclerosis of native arteries of extremities with rest pain, left leg: Secondary | ICD-10-CM | POA: Diagnosis not present

## 2023-06-13 NOTE — Progress Notes (Signed)
Mr. [name] [name] denies chest pain or shortness of breath. Patient denies having any s/s of Covid in his household, also denies any known exposure to Covid. mitral regurgitation. [name] [name] denies  any s/s of upper or lower respiratory infection in the past 8 weeks.   Mr. [name] [name] reports that he has a cough and runny does due to allergies.  Mr. [name] [name] PCP is Dr Baxter Hire Sherril Croon.  Patient has type II diabetes, he is not on mediation and he does not check CBG.  I instructed patient to hold Vitamins and herbal products.

## 2023-06-14 ENCOUNTER — Inpatient Hospital Stay (HOSPITAL_COMMUNITY): Payer: Self-pay | Admitting: Anesthesiology

## 2023-06-14 ENCOUNTER — Other Ambulatory Visit: Payer: Self-pay

## 2023-06-14 ENCOUNTER — Encounter (HOSPITAL_COMMUNITY): Payer: Self-pay | Admitting: Vascular Surgery

## 2023-06-14 ENCOUNTER — Encounter (HOSPITAL_COMMUNITY)
Admission: RE | Disposition: A | Discharge status: Home Health | Payer: Self-pay | Source: Home / Self Care | Attending: Vascular Surgery

## 2023-06-14 ENCOUNTER — Inpatient Hospital Stay (HOSPITAL_COMMUNITY)
Admission: RE | Admit: 2023-06-14 | Discharge: 2023-06-21 | DRG: 253 | Disposition: A | Discharge status: Home Health | Payer: Medicare HMO | Attending: Vascular Surgery | Admitting: Vascular Surgery

## 2023-06-14 ENCOUNTER — Inpatient Hospital Stay (HOSPITAL_COMMUNITY): Payer: Medicare HMO | Admitting: Anesthesiology

## 2023-06-14 DIAGNOSIS — Z89422 Acquired absence of other left toe(s): Secondary | ICD-10-CM | POA: Diagnosis not present

## 2023-06-14 DIAGNOSIS — Z95828 Presence of other vascular implants and grafts: Secondary | ICD-10-CM

## 2023-06-14 DIAGNOSIS — L97529 Non-pressure chronic ulcer of other part of left foot with unspecified severity: Secondary | ICD-10-CM | POA: Diagnosis not present

## 2023-06-14 DIAGNOSIS — Z79899 Other long term (current) drug therapy: Secondary | ICD-10-CM | POA: Diagnosis not present

## 2023-06-14 DIAGNOSIS — Z6829 Body mass index (BMI) 29.0-29.9, adult: Secondary | ICD-10-CM | POA: Diagnosis not present

## 2023-06-14 DIAGNOSIS — Z91041 Radiographic dye allergy status: Secondary | ICD-10-CM | POA: Diagnosis not present

## 2023-06-14 DIAGNOSIS — I70262 Atherosclerosis of native arteries of extremities with gangrene, left leg: Secondary | ICD-10-CM | POA: Diagnosis not present

## 2023-06-14 DIAGNOSIS — I70222 Atherosclerosis of native arteries of extremities with rest pain, left leg: Principal | ICD-10-CM | POA: Diagnosis present

## 2023-06-14 DIAGNOSIS — K219 Gastro-esophageal reflux disease without esophagitis: Secondary | ICD-10-CM | POA: Diagnosis not present

## 2023-06-14 DIAGNOSIS — I998 Other disorder of circulatory system: Secondary | ICD-10-CM

## 2023-06-14 DIAGNOSIS — E11621 Type 2 diabetes mellitus with foot ulcer: Secondary | ICD-10-CM | POA: Diagnosis not present

## 2023-06-14 DIAGNOSIS — I10 Essential (primary) hypertension: Secondary | ICD-10-CM | POA: Diagnosis present

## 2023-06-14 DIAGNOSIS — E11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: Secondary | ICD-10-CM | POA: Diagnosis not present

## 2023-06-14 DIAGNOSIS — D62 Acute posthemorrhagic anemia: Secondary | ICD-10-CM | POA: Diagnosis not present

## 2023-06-14 DIAGNOSIS — Z888 Allergy status to other drugs, medicaments and biological substances status: Secondary | ICD-10-CM

## 2023-06-14 DIAGNOSIS — I96 Gangrene, not elsewhere classified: Secondary | ICD-10-CM | POA: Diagnosis not present

## 2023-06-14 DIAGNOSIS — Z7902 Long term (current) use of antithrombotics/antiplatelets: Secondary | ICD-10-CM | POA: Diagnosis not present

## 2023-06-14 DIAGNOSIS — Z87891 Personal history of nicotine dependence: Secondary | ICD-10-CM | POA: Diagnosis not present

## 2023-06-14 DIAGNOSIS — I70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: Principal | ICD-10-CM | POA: Diagnosis present

## 2023-06-14 DIAGNOSIS — Z8249 Family history of ischemic heart disease and other diseases of the circulatory system: Secondary | ICD-10-CM

## 2023-06-14 DIAGNOSIS — E66811 Obesity, class 1: Secondary | ICD-10-CM | POA: Diagnosis not present

## 2023-06-14 DIAGNOSIS — T8781 Dehiscence of amputation stump: Secondary | ICD-10-CM | POA: Diagnosis not present

## 2023-06-14 DIAGNOSIS — Z7982 Long term (current) use of aspirin: Secondary | ICD-10-CM | POA: Diagnosis not present

## 2023-06-14 HISTORY — DX: Peripheral vascular disease, unspecified: I73.9

## 2023-06-14 HISTORY — DX: Type 2 diabetes mellitus without complications: E11.9

## 2023-06-14 HISTORY — PX: VEIN HARVEST: SHX6363

## 2023-06-14 HISTORY — DX: Gastro-esophageal reflux disease without esophagitis: K21.9

## 2023-06-14 HISTORY — PX: FEMORAL-TIBIAL BYPASS GRAFT: SHX938

## 2023-06-14 LAB — CBC
HCT: 18.4 % — ABNORMAL LOW (ref 39.0–52.0)
Hemoglobin: 6.1 g/dL — CL (ref 13.0–17.0)
MCH: 29 pg (ref 26.0–34.0)
MCHC: 33.2 g/dL (ref 30.0–36.0)
MCV: 87.6 fL (ref 80.0–100.0)
Platelets: 232 10*3/uL (ref 150–400)
RBC: 2.1 MIL/uL — ABNORMAL LOW (ref 4.22–5.81)
RDW: 14.5 % (ref 11.5–15.5)
WBC: 9.8 10*3/uL (ref 4.0–10.5)
nRBC: 0 % (ref 0.0–0.2)

## 2023-06-14 LAB — URINALYSIS, ROUTINE W REFLEX MICROSCOPIC
Bacteria, UA: NONE SEEN
Bilirubin Urine: NEGATIVE
Glucose, UA: NEGATIVE mg/dL
Hgb urine dipstick: NEGATIVE
Ketones, ur: NEGATIVE mg/dL
Nitrite: NEGATIVE
Protein, ur: NEGATIVE mg/dL
Specific Gravity, Urine: 1.017 (ref 1.005–1.030)
pH: 6 (ref 5.0–8.0)

## 2023-06-14 LAB — GLUCOSE, CAPILLARY
Glucose-Capillary: 107 mg/dL — ABNORMAL HIGH (ref 70–99)
Glucose-Capillary: 177 mg/dL — ABNORMAL HIGH (ref 70–99)

## 2023-06-14 LAB — CREATININE, SERUM
Creatinine, Ser: 0.42 mg/dL — ABNORMAL LOW (ref 0.61–1.24)
GFR, Estimated: >60 mL/min (ref >60)

## 2023-06-14 LAB — HEMOGLOBIN AND HEMATOCRIT, BLOOD
HCT: 22.3 % — ABNORMAL LOW (ref 39.0–52.0)
Hemoglobin: 7.6 g/dL — ABNORMAL LOW (ref 13.0–17.0)

## 2023-06-14 LAB — ABO/RH: ABO/RH(D): B POS

## 2023-06-14 SURGERY — CREATION, BYPASS, ARTERIAL, FEMORAL TO TIBIAL, USING GRAFT
Anesthesia: General | Site: Leg Upper | Laterality: Left

## 2023-06-14 MED ORDER — CHLORHEXIDINE GLUCONATE CLOTH 2 % EX PADS: 6.0000 | MEDICATED_PAD | Freq: Once | CUTANEOUS | Status: DC

## 2023-06-14 MED ORDER — CLOPIDOGREL BISULFATE 75 MG PO TABS
75.0000 mg | ORAL_TABLET | Freq: Every day | ORAL | Status: DC
  Administered 2023-06-15 – 2023-06-21 (×6): 75 mg via ORAL
  Filled 2023-06-14 (×6): qty 1

## 2023-06-14 MED ORDER — SODIUM CHLORIDE 0.9 % IV SOLN: INTRAVENOUS | Status: DC

## 2023-06-14 MED ORDER — SUGAMMADEX SODIUM 200 MG/2ML IV SOLN
INTRAVENOUS | Status: DC | PRN
  Administered 2023-06-14: 200 mg via INTRAVENOUS

## 2023-06-14 MED ORDER — 0.9 % SODIUM CHLORIDE (POUR BTL) OPTIME
TOPICAL | Status: DC | PRN
  Administered 2023-06-14 (×2): 1000 mL

## 2023-06-14 MED ORDER — EPHEDRINE SULFATE-NACL 50-0.9 MG/10ML-% IV SOSY
PREFILLED_SYRINGE | INTRAVENOUS | Status: DC | PRN
  Administered 2023-06-14: 10 mg via INTRAVENOUS
  Administered 2023-06-14 (×2): 5 mg via INTRAVENOUS

## 2023-06-14 MED ORDER — LEVOFLOXACIN 500 MG PO TABS
500.0000 mg | ORAL_TABLET | Freq: Every day | ORAL | Status: DC
  Administered 2023-06-15 – 2023-06-21 (×6): 500 mg via ORAL
  Filled 2023-06-14 (×8): qty 1

## 2023-06-14 MED ORDER — FENTANYL CITRATE (PF) 100 MCG/2ML IJ SOLN: 25.0000 ug | INTRAMUSCULAR | Status: DC | PRN

## 2023-06-14 MED ORDER — ACETAMINOPHEN 325 MG PO TABS
325.0000 mg | ORAL_TABLET | ORAL | Status: DC | PRN
  Administered 2023-06-15 – 2023-06-20 (×8): 650 mg via ORAL
  Filled 2023-06-14 (×8): qty 2

## 2023-06-14 MED ORDER — ACETAMINOPHEN 650 MG RE SUPP: 325.0000 mg | RECTAL | Status: DC | PRN

## 2023-06-14 MED ORDER — CHLORHEXIDINE GLUCONATE 0.12 % MT SOLN
15.0000 mL | Freq: Once | OROMUCOSAL | Status: AC
  Administered 2023-06-14: 15 mL via OROMUCOSAL
  Filled 2023-06-14: qty 15

## 2023-06-14 MED ORDER — PHENYLEPHRINE 80 MCG/ML (10ML) SYRINGE FOR IV PUSH (FOR BLOOD PRESSURE SUPPORT)
PREFILLED_SYRINGE | INTRAVENOUS | Status: DC | PRN
  Administered 2023-06-14 (×6): 80 ug via INTRAVENOUS

## 2023-06-14 MED ORDER — OXYCODONE-ACETAMINOPHEN 5-325 MG PO TABS
1.0000 | ORAL_TABLET | ORAL | Status: DC | PRN
  Administered 2023-06-14 – 2023-06-21 (×23): 1 via ORAL
  Filled 2023-06-14 (×24): qty 1

## 2023-06-14 MED ORDER — PROPOFOL 10 MG/ML IV BOLUS
INTRAVENOUS | Status: DC | PRN
  Administered 2023-06-14: 100 mg via INTRAVENOUS
  Administered 2023-06-14: 20 mg via INTRAVENOUS
  Administered 2023-06-14: 30 mg via INTRAVENOUS

## 2023-06-14 MED ORDER — PHENOL 1.4 % MT LIQD: 1.0000 | OROMUCOSAL | Status: DC | PRN

## 2023-06-14 MED ORDER — HEPARIN SODIUM (PORCINE) 1000 UNIT/ML IJ SOLN
INTRAMUSCULAR | Status: DC | PRN
Start: 2023-06-14 — End: 2023-06-14
  Administered 2023-06-14: 8000 [IU] via INTRAVENOUS

## 2023-06-14 MED ORDER — FENTANYL CITRATE (PF) 250 MCG/5ML IJ SOLN
INTRAMUSCULAR | Status: DC | PRN
  Administered 2023-06-14 (×3): 50 ug via INTRAVENOUS
  Administered 2023-06-14: 100 ug via INTRAVENOUS
  Administered 2023-06-14 (×2): 50 ug via INTRAVENOUS

## 2023-06-14 MED ORDER — LIDOCAINE 2% (20 MG/ML) 5 ML SYRINGE
INTRAMUSCULAR | Status: DC | PRN
  Administered 2023-06-14: 100 mg via INTRAVENOUS

## 2023-06-14 MED ORDER — ONDANSETRON HCL 4 MG/2ML IJ SOLN
INTRAMUSCULAR | Status: DC | PRN
  Administered 2023-06-14: 4 mg via INTRAVENOUS

## 2023-06-14 MED ORDER — ONDANSETRON HCL 4 MG/2ML IJ SOLN: 4.0000 mg | Freq: Four times a day (QID) | INTRAMUSCULAR | Status: DC | PRN

## 2023-06-14 MED ORDER — MAGNESIUM SULFATE 2 GM/50ML IV SOLN: 2.0000 g | Freq: Every day | INTRAVENOUS | Status: DC | PRN

## 2023-06-14 MED ORDER — FLEET ENEMA RE ENEM: 1.0000 | ENEMA | Freq: Once | RECTAL | Status: DC | PRN

## 2023-06-14 MED ORDER — ASPIRIN 81 MG PO CHEW
81.0000 mg | CHEWABLE_TABLET | Freq: Every day | ORAL | Status: DC
  Administered 2023-06-15 – 2023-06-21 (×6): 81 mg via ORAL
  Filled 2023-06-14 (×6): qty 1

## 2023-06-14 MED ORDER — DEXAMETHASONE SODIUM PHOSPHATE 10 MG/ML IJ SOLN
INTRAMUSCULAR | Status: DC | PRN
  Administered 2023-06-14: 10 mg via INTRAVENOUS

## 2023-06-14 MED ORDER — PHENYLEPHRINE HCL-NACL 20-0.9 MG/250ML-% IV SOLN
INTRAVENOUS | Status: DC | PRN
  Administered 2023-06-14: 30 ug/min via INTRAVENOUS

## 2023-06-14 MED ORDER — SENNOSIDES-DOCUSATE SODIUM 8.6-50 MG PO TABS
1.0000 | ORAL_TABLET | Freq: Every evening | ORAL | Status: DC | PRN
  Administered 2023-06-19: 1 via ORAL
  Filled 2023-06-14: qty 1

## 2023-06-14 MED ORDER — LACTATED RINGERS IV SOLN: INTRAVENOUS | Status: DC

## 2023-06-14 MED ORDER — HEPARIN SODIUM (PORCINE) 5000 UNIT/ML IJ SOLN
5000.0000 [IU] | Freq: Three times a day (TID) | INTRAMUSCULAR | Status: DC
  Administered 2023-06-15 – 2023-06-21 (×18): 5000 [IU] via SUBCUTANEOUS
  Filled 2023-06-14 (×18): qty 1

## 2023-06-14 MED ORDER — ROCURONIUM BROMIDE 10 MG/ML (PF) SYRINGE
PREFILLED_SYRINGE | INTRAVENOUS | Status: DC | PRN
  Administered 2023-06-14: 60 mg via INTRAVENOUS
  Administered 2023-06-14: 20 mg via INTRAVENOUS

## 2023-06-14 MED ORDER — AMOXICILLIN-POT CLAVULANATE 875-125 MG PO TABS
1.0000 | ORAL_TABLET | Freq: Two times a day (BID) | ORAL | Status: DC
  Administered 2023-06-14 – 2023-06-21 (×13): 1 via ORAL
  Filled 2023-06-14 (×13): qty 1

## 2023-06-14 MED ORDER — FENTANYL CITRATE (PF) 250 MCG/5ML IJ SOLN
INTRAMUSCULAR | Status: AC
  Filled 2023-06-14: qty 5

## 2023-06-14 MED ORDER — ATORVASTATIN CALCIUM 40 MG PO TABS
40.0000 mg | ORAL_TABLET | Freq: Every day | ORAL | Status: DC
  Administered 2023-06-15 – 2023-06-21 (×6): 40 mg via ORAL
  Filled 2023-06-14 (×6): qty 1

## 2023-06-14 MED ORDER — DOCUSATE SODIUM 100 MG PO CAPS
100.0000 mg | ORAL_CAPSULE | Freq: Every day | ORAL | Status: DC
  Administered 2023-06-15 – 2023-06-21 (×5): 100 mg via ORAL
  Filled 2023-06-14 (×6): qty 1

## 2023-06-14 MED ORDER — CEFAZOLIN SODIUM 1 G IJ SOLR
INTRAMUSCULAR | Status: AC
  Filled 2023-06-14: qty 20

## 2023-06-14 MED ORDER — AMLODIPINE BESYLATE 5 MG PO TABS
5.0000 mg | ORAL_TABLET | Freq: Every day | ORAL | Status: DC
  Administered 2023-06-16 – 2023-06-21 (×4): 5 mg via ORAL
  Filled 2023-06-14 (×6): qty 1

## 2023-06-14 MED ORDER — HYDRALAZINE HCL 20 MG/ML IJ SOLN: 5.0000 mg | INTRAMUSCULAR | Status: DC | PRN

## 2023-06-14 MED ORDER — PANTOPRAZOLE SODIUM 40 MG PO TBEC
40.0000 mg | DELAYED_RELEASE_TABLET | Freq: Every day | ORAL | Status: DC
  Administered 2023-06-15 – 2023-06-21 (×6): 40 mg via ORAL
  Filled 2023-06-14 (×6): qty 1

## 2023-06-14 MED ORDER — LABETALOL HCL 5 MG/ML IV SOLN: 10.0000 mg | INTRAVENOUS | Status: DC | PRN

## 2023-06-14 MED ORDER — PROTAMINE SULFATE 10 MG/ML IV SOLN
INTRAVENOUS | Status: AC
  Filled 2023-06-14: qty 5

## 2023-06-14 MED ORDER — SODIUM CHLORIDE 0.9 % IV SOLN: INTRAVENOUS | Status: AC

## 2023-06-14 MED ORDER — CEFAZOLIN SODIUM-DEXTROSE 2-4 GM/100ML-% IV SOLN
2.0000 g | INTRAVENOUS | Status: AC
  Administered 2023-06-14 (×2): 2 g via INTRAVENOUS
  Filled 2023-06-14: qty 100

## 2023-06-14 MED ORDER — HEPARIN 6000 UNIT IRRIGATION SOLUTION
Status: DC | PRN
  Administered 2023-06-14: 1

## 2023-06-14 MED ORDER — ACETAMINOPHEN 500 MG PO TABS
1000.0000 mg | ORAL_TABLET | Freq: Once | ORAL | Status: AC
  Administered 2023-06-14: 1000 mg via ORAL
  Filled 2023-06-14: qty 2

## 2023-06-14 MED ORDER — POTASSIUM CHLORIDE CRYS ER 20 MEQ PO TBCR: 20.0000 meq | EXTENDED_RELEASE_TABLET | Freq: Every day | ORAL | Status: DC | PRN

## 2023-06-14 MED ORDER — HEPARIN SODIUM (PORCINE) 1000 UNIT/ML IJ SOLN
INTRAMUSCULAR | Status: AC
  Filled 2023-06-14: qty 1

## 2023-06-14 MED ORDER — ALUM & MAG HYDROXIDE-SIMETH 200-200-20 MG/5ML PO SUSP: 15.0000 mL | ORAL | Status: DC | PRN

## 2023-06-14 MED ORDER — BISACODYL 5 MG PO TBEC: 5.0000 mg | DELAYED_RELEASE_TABLET | Freq: Every day | ORAL | Status: DC | PRN

## 2023-06-14 MED ORDER — ONDANSETRON HCL 4 MG/2ML IJ SOLN: 4.0000 mg | Freq: Once | INTRAMUSCULAR | Status: DC | PRN

## 2023-06-14 MED ORDER — CEFAZOLIN SODIUM-DEXTROSE 2-4 GM/100ML-% IV SOLN
2.0000 g | Freq: Three times a day (TID) | INTRAVENOUS | Status: AC
  Administered 2023-06-14 – 2023-06-15 (×2): 2 g via INTRAVENOUS
  Filled 2023-06-14 (×2): qty 100

## 2023-06-14 MED ORDER — GUAIFENESIN-DM 100-10 MG/5ML PO SYRP: 15.0000 mL | ORAL_SOLUTION | ORAL | Status: DC | PRN

## 2023-06-14 MED ORDER — HEMOSTATIC AGENTS (NO CHARGE) OPTIME
TOPICAL | Status: DC | PRN
Start: 2023-06-14 — End: 2023-06-14
  Administered 2023-06-14: 1 via TOPICAL

## 2023-06-14 MED ORDER — ORAL CARE MOUTH RINSE: 15.0000 mL | Freq: Once | OROMUCOSAL | Status: AC

## 2023-06-14 MED ORDER — HYDROMORPHONE HCL 1 MG/ML IJ SOLN
0.5000 mg | INTRAMUSCULAR | Status: DC | PRN
  Administered 2023-06-14 – 2023-06-21 (×12): 0.5 mg via INTRAVENOUS
  Filled 2023-06-14 (×12): qty 0.5

## 2023-06-14 MED ORDER — PROPOFOL 10 MG/ML IV BOLUS
INTRAVENOUS | Status: AC
  Filled 2023-06-14: qty 20

## 2023-06-14 MED ORDER — SODIUM CHLORIDE 0.9 % IV SOLN: 500.0000 mL | Freq: Once | INTRAVENOUS | Status: DC | PRN

## 2023-06-14 MED ORDER — METOPROLOL TARTRATE 5 MG/5ML IV SOLN: 2.0000 mg | INTRAVENOUS | Status: DC | PRN

## 2023-06-14 SURGICAL SUPPLY — 43 items
ADH SKN CLS APL DERMABOND .7 (GAUZE/BANDAGES/DRESSINGS) ×4
BAG COUNTER SPONGE SURGICOUNT (BAG) ×1 IMPLANT
BAG SPNG CNTER NS LX DISP (BAG) ×2
CANISTER SUCT 3000ML PPV (MISCELLANEOUS) ×1 IMPLANT
CANNULA VESSEL 3MM 2 BLNT TIP (CANNULA) IMPLANT
CLIP TI MEDIUM 24 (CLIP) ×1 IMPLANT
CLIP TI WIDE RED SMALL 24 (CLIP) ×1 IMPLANT
COVER PROBE W GEL 5X96 (DRAPES) IMPLANT
CUFF TOURN SGL QUICK 24 (TOURNIQUET CUFF) ×1
CUFF TOURN SGL QUICK 42 (TOURNIQUET CUFF) IMPLANT
CUFF TRNQT CYL 24X4X16.5-23 (TOURNIQUET CUFF) IMPLANT
DERMABOND ADVANCED .7 DNX12 (GAUZE/BANDAGES/DRESSINGS) ×1 IMPLANT
ELECT REM PT RETURN 9FT ADLT (ELECTROSURGICAL) ×2
ELECTRODE REM PT RTRN 9FT ADLT (ELECTROSURGICAL) ×1 IMPLANT
GLOVE BIOGEL PI IND STRL 7.0 (GLOVE) ×1 IMPLANT
GOWN STRL REUS W/ TWL LRG LVL3 (GOWN DISPOSABLE) ×2 IMPLANT
GOWN STRL REUS W/ TWL XL LVL3 (GOWN DISPOSABLE) ×1 IMPLANT
GOWN STRL REUS W/TWL LRG LVL3 (GOWN DISPOSABLE) ×2
GOWN STRL REUS W/TWL XL LVL3 (GOWN DISPOSABLE) ×1
KIT BASIN OR (CUSTOM PROCEDURE TRAY) ×1 IMPLANT
KIT TURNOVER KIT B (KITS) ×1 IMPLANT
LOOP VASCULAR MINI 18 RED (MISCELLANEOUS) ×1
NS IRRIG 1000ML POUR BTL (IV SOLUTION) ×2 IMPLANT
PACK PERIPHERAL VASCULAR (CUSTOM PROCEDURE TRAY) ×1 IMPLANT
PAD ARMBOARD 7.5X6 YLW CONV (MISCELLANEOUS) ×2 IMPLANT
POWDER SURGICEL 3.0 GRAM (HEMOSTASIS) IMPLANT
SPONGE T-LAP 18X18 ~~LOC~~+RFID (SPONGE) IMPLANT
SUT MNCRL AB 4-0 PS2 18 (SUTURE) ×2 IMPLANT
SUT PROLENE 5 0 C 1 24 (SUTURE) ×1 IMPLANT
SUT PROLENE 6 0 BV (SUTURE) ×1 IMPLANT
SUT SILK 2 0 SH (SUTURE) ×1 IMPLANT
SUT SILK 2 0 SH CR/8 (SUTURE) IMPLANT
SUT SILK 3 0 (SUTURE) ×2
SUT SILK 3-0 18XBRD TIE 12 (SUTURE) IMPLANT
SUT VIC AB 2-0 CT1 27 (SUTURE) ×3
SUT VIC AB 2-0 CT1 TAPERPNT 27 (SUTURE) ×2 IMPLANT
SUT VIC AB 3-0 SH 27 (SUTURE) ×4
SUT VIC AB 3-0 SH 27X BRD (SUTURE) ×2 IMPLANT
TOWEL GREEN STERILE (TOWEL DISPOSABLE) ×1 IMPLANT
TRAY FOLEY MTR SLVR 16FR STAT (SET/KITS/TRAYS/PACK) ×1 IMPLANT
UNDERPAD 30X36 HEAVY ABSORB (UNDERPADS AND DIAPERS) ×1 IMPLANT
VASCULAR TIE MINI RED 18IN STL (MISCELLANEOUS) IMPLANT
WATER STERILE IRR 1000ML POUR (IV SOLUTION) ×1 IMPLANT

## 2023-06-14 NOTE — Anesthesia Preprocedure Evaluation (Addendum)
Anesthesia Evaluation  Patient identified by MRN, date of birth, ID band Patient awake    Reviewed: Allergy & Precautions, NPO status , Patient's Chart, lab work & pertinent test results  Airway Mallampati: II  TM Distance: >3 FB Neck ROM: Full    Dental  (+) Dental Advisory Given, Missing, Upper Dentures   Pulmonary Patient abstained from smoking., former smoker   Pulmonary exam normal breath sounds clear to auscultation       Cardiovascular hypertension, Pt. on medications + Peripheral Vascular Disease (Chronic limb threatening ischemia with tissue loss)  Normal cardiovascular exam Rhythm:Regular Rate:Normal  Echo 06/09/23:  1. Left ventricular ejection fraction, by estimation, is 55 to 60%. The  left ventricle has normal function. The left ventricle has no regional  wall motion abnormalities. Left ventricular diastolic parameters were  normal.   2. Right ventricular systolic function is normal. The right ventricular  size is normal.   3. The mitral valve is normal in structure. Trivial mitral valve  regurgitation. No evidence of mitral stenosis.   4. The aortic valve is normal in structure. There is mild calcification  of the aortic valve. Aortic valve regurgitation is not visualized. No  aortic stenosis is present.   5. The inferior vena cava is normal in size with greater than 50%  respiratory variability, suggesting right atrial pressure of 3 mmHg.     Neuro/Psych  Headaches  negative psych ROS   GI/Hepatic Neg liver ROS,GERD  Medicated,,  Endo/Other  diabetes, Type 2    Renal/GU negative Renal ROS     Musculoskeletal  (+) Arthritis ,    Abdominal   Peds  Hematology  (+) Blood dyscrasia (Plavix), anemia   Anesthesia Other Findings Day of surgery medications reviewed with the patient.  Reproductive/Obstetrics                             Anesthesia Physical Anesthesia Plan  ASA:  3  Anesthesia Plan: General   Post-op Pain Management: Tylenol PO (pre-op)*   Induction: Intravenous  PONV Risk Score and Plan: 2 and Dexamethasone and Ondansetron  Airway Management Planned: Oral ETT  Additional Equipment: Arterial line  Intra-op Plan:   Post-operative Plan: Extubation in OR  Informed Consent: I have reviewed the patients History and Physical, chart, labs and discussed the procedure including the risks, benefits and alternatives for the proposed anesthesia with the patient or authorized representative who has indicated his/her understanding and acceptance.     Dental advisory given  Plan Discussed with: CRNA  Anesthesia Plan Comments: (2nd PIV)       Anesthesia Quick Evaluation

## 2023-06-14 NOTE — Anesthesia Procedure Notes (Signed)
Arterial Line Insertion Start/End10/04/2023 7:20 AM, 06/14/2023 7:25 AM Performed by: Collene Schlichter, MD, Holtzman, Ariel Rochele Raring, CRNA, CRNA  Patient location: Pre-op. Preanesthetic checklist: patient identified, IV checked, site marked, risks and benefits discussed, surgical consent, monitors and equipment checked, pre-op evaluation, timeout performed and anesthesia consent Lidocaine 1% used for infiltration Left, radial was placed Catheter size: 20 G Hand hygiene performed  and maximum sterile barriers used   Attempts: 1 Procedure performed without using ultrasound guided technique. Following insertion, dressing applied and Biopatch. Post procedure assessment: normal and unchanged  Patient tolerated the procedure well with no immediate complications.

## 2023-06-14 NOTE — Anesthesia Postprocedure Evaluation (Signed)
Anesthesia Post Note  Patient: IZAAH WESTMAN  Procedure(s) Performed: LEFT SUPERFICIAL FEMORAL-POSTERIOR TIBIAL ARTERY BYPASS (Left: Leg Upper) SAPHENOUS VEIN HARVEST (Left: Leg Upper)     Patient location during evaluation: PACU Anesthesia Type: General Level of consciousness: awake and alert Pain management: pain level controlled Vital Signs Assessment: post-procedure vital signs reviewed and stable Respiratory status: spontaneous breathing, nonlabored ventilation, respiratory function stable and patient connected to nasal cannula oxygen Cardiovascular status: blood pressure returned to baseline and stable Postop Assessment: no apparent nausea or vomiting Anesthetic complications: no   There were no known notable events for this encounter.  Last Vitals:  Vitals:   06/14/23 1415 06/14/23 1430  BP: 123/61 133/69  Pulse: (!) 107 (!) 107  Resp: 14 16  Temp:    SpO2: 98% 100%    Last Pain:  Vitals:   06/14/23 0732  TempSrc: Oral  PainSc: 10-Worst pain ever                 Collene Schlichter

## 2023-06-14 NOTE — Transfer of Care (Signed)
Immediate Anesthesia Transfer of Care Note  Patient: JAYTON POPELKA  Procedure(s) Performed: LEFT SUPERFICIAL FEMORAL-POSTERIOR TIBIAL ARTERY BYPASS (Left: Leg Upper) SAPHENOUS VEIN HARVEST (Left: Leg Upper)  Patient Location: PACU  Anesthesia Type:General  Level of Consciousness: awake, alert , and oriented  Airway & Oxygen Therapy: Patient Spontanous Breathing and Patient connected to face mask oxygen  Post-op Assessment: Report given to RN and Post -op Vital signs reviewed and stable  Post vital signs: Reviewed and stable  Last Vitals:  Vitals Value Taken Time  BP 155/79 06/14/23 1350  Temp    Pulse 118 06/14/23 1354  Resp 13 06/14/23 1354  SpO2 100 % 06/14/23 1354  Vitals shown include unfiled device data.  Last Pain:  Vitals:   06/14/23 0732  TempSrc: Oral  PainSc: 10-Worst pain ever      Patients Stated Pain Goal: 5 (06/14/23 0732)  Complications: There were no known notable events for this encounter.

## 2023-06-14 NOTE — Anesthesia Procedure Notes (Signed)
Procedure Name: Intubation Date/Time: 06/14/2023 8:28 AM  Performed by: Allyn Kenner, CRNAPre-anesthesia Checklist: Patient identified, Emergency Drugs available, Suction available and Patient being monitored Patient Re-evaluated:Patient Re-evaluated prior to induction Oxygen Delivery Method: Circle System Utilized Preoxygenation: Pre-oxygenation with 100% oxygen Induction Type: IV induction Ventilation: Mask ventilation without difficulty Laryngoscope Size: 4 and Mac Grade View: Grade I Tube type: Oral Tube size: 7.5 mm Number of attempts: 1 Airway Equipment and Method: Stylet and Oral airway Placement Confirmation: ETT inserted through vocal cords under direct vision, positive ETCO2 and breath sounds checked- equal and bilateral Tube secured with: Tape Dental Injury: Teeth and Oropharynx as per pre-operative assessment

## 2023-06-14 NOTE — Op Note (Signed)
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 male with chronic limb threatening ischemia who underwent left second toe ray amputation earlier last week but the wound failed to heal and ABI was obtained prior to discharge which demonstrated a pressure of 0 at the ankle on the left foot.  Angiogram was performed on 06/08/2023, the left iliac was treated with stent angioplasty he was noted to have an occlusion of the tibial artery at P2 with reconstitution of the posterior tibial artery which supplied the to the foot.  Vein mapping was obtained which demonstrated adequate left great saphenous vein and risks and benefits of surgical bypass were reviewed with the patient and his wife.  They expressed understanding were willing to proceed.  FINDINGS:   A duplicate system of left GSV from the mid thigh to lower leg.  Adequate size of the main great saphenous vein from the saphenofemoral junction to the segment just below the knee. Mild atherosclerotic disease in mid SFA but with pulsatile inflow.  Adequate sized target with minimal disease of the mid PT artery. Augmented Doppler signal in PT at ankle and in field distal to anastomosis after completion   Given the complexity of the case,  the assistant was necessary in order to expedient the procedure and safely perform the technical aspects of the operation.  The assistant provided traction and countertraction to assist with exposure of the artery proximally and distally.  They assisted with suture ligature of multiple branches.  Their assistance was critical in the performance of both the proximal and distal  anastomosis.These skills, especially following the Prolene suture for the anastomosis, could not have been adequately performed by a scrub tech assistant.    TECHNIQUE:   After full informed written consent was obtained, the patient was brought back to the operating room and placed supine upon the operating table.  Prior to induction, the patient was given intravenous antibiotics.  After obtaining adequate anesthesia, the patient was prepped and draped in the standard fashion for a femoral to PT bypass operation.    The patient's left greater saphenous vein was identified under Sonosite guidance. Skip incisions were made over the greater saphenous vein from the saphenofemoral junction down to mid calf.  The vein conduit was  found to be adequate with size.  Side branches of greater saphenous vein were tied off with 3-0 silk or clipped with small titanium clips.  The saphenofemoral junction was clamped and the greater saphenous vein was transected.  The saphenofemoral junction was ligated with a 2-0 silk suture ligature.  The distal extent of this conduit was tied off at the level of mid calf and the conduit transected proximally.  The harvest vein conduit was soaked in a heparinized saline solution.  A vessel cannula was tied to the distal end of the vein and the vein was tested by hydrodistension.  Leaks in the conduit were repaired with 4-0 silk ties and 7-0 Prolene stitches.  At the end of this process, I felt the conduit to be adequate quality.  The vein was  then dilated allowed to rest naturally and the anterior surface of the vein was marked with a marking pen.  At this point, attention was turned to the calf.  An longitudinal incision was made one finger-width posterior to the tibia.  Using blunt dissection and electrocautery, a plane was developed through the subcutaneous tissue and fascia down to the popliteal space.  The calf muscle was retracted posteriorly and the soleus was taken down off of  the tibia with meticulous cautery and sharp dissection. The PT artery was identified and found on exam to be soft and a doppler signal was present.  The artery was dissected free from the surrounding tibial veins for a 4cm segment.  Attention was the turned to the proximal target. The previous angiogram demonstrated a patent SFA to the level of the above knee popliteal with minimal disease. A mid segment of the SFA was identified with the sonosite ultrasound and a portion of the saphenectomy site in the mid thigh was carried deeper through the muscle fascia and the SFA and femoral vein was identified. The SFA was dissected free for a segment of approximately 6 cm. A subfascial tunnel was then created with blunt finger dissection and a large aortic clamp was placed was placed through this tunnel and an used to carefully pull the conduit through with care not to twist or kink the conduit.  The patient was then systemically heparinized and after waiting three minutes, the SFA was clamped proximally and distally.  An arteriotomy was made in the SFA with an 11 blade and extended proximally and distally with a Potts scissor.  The proximal end of the conduit was spatulated to the dimensions of the arteriotomy.  The conduit was sewn to the SFA with a running stitch of 6-0 prolene.  All vessels were forward flushed and backbled and upon completion of the anastomosis there was pulsatile flow through the bypass.  Attention was then turned back to the tibial exposure.  An Esmarch was used to exsanguinate the leg and a tourniquet was placed on the mid thigh and inflated to 250 mmHg. I reset the exposure of posterior tibial artery and pulled the conduit to appropriate tension and length, taking into account straightening out the leg. An arteriotomy in the PT was made with an 11-blade and extended it proximally and distally with a Potts scissor.  I spatulated the conduit to meet the dimensions of the arteriotomy.  The  conduit was sewn to the PT with a running stitch of 6-0 prolene.  Prior to completion, the toe of the anastomosis was sounded with a 2 mm coronary dilator.  The tourniquet was deflated and all vessels were forward flushed and backbled and the anastomosis was completed.  At this point, all incisions were washed out and surgicel was placed into both incisions.  The continuous doppler exam demonstrated augmentation of the PT with the bypass open versus clamped.  At this point, bleeding in both incisions were controlled with electrocautery, vascular clips and suture ligature. When no further active bleeding was present I washed out both incisions.  The lower leg incision was closed in layers with running 3-0 Vicryl and a 4-0 Monocryl for the skin.  The deeper aspect of the saphenectomy site, where the proximal anastomosis was fashioned to the SFA, was closed with a 2-0 Vicryl and all saphenectomy site incisions were closed with 3-0 Vicryl in the subcutaneous tissue and 4-0 Monocryl for the skin.  Dermabond was then applied to all incisions and  once dry, gauze were applied and the leg was wrapped in an Ace bandage.  Patient tolerated the procedure well was brought to PACU in stable condition.  A strong multiphasic PT signal was present in PACU.  Daria Pastures, MD [facility] DATE OF DICTATION:   06/14/2023 3

## 2023-06-14 NOTE — Interval H&P Note (Signed)
History and Physical Interval Note:  06/14/2023 8:03 AM  [name] [name]  has presented today for surgery, with the diagnosis of Chronic limb threatening ischemia with tissue loss.  The various methods of treatment have been discussed with the patient and family. After consideration of risks, benefits and other options for treatment, the patient has consented to  Procedure(s): LEFT FEMORAL-POSTERIOR TIBIAL ARTERY BYPASS (Left) as a surgical intervention.  The patient's history has been reviewed, patient examined, no change in status, stable for surgery.  I have reviewed the patient's chart and labs.  Questions were answered to the patient's satisfaction.     Daria Pastures

## 2023-06-15 ENCOUNTER — Encounter (HOSPITAL_COMMUNITY): Payer: Self-pay | Admitting: Vascular Surgery

## 2023-06-15 LAB — BASIC METABOLIC PANEL
Anion gap: 8 (ref 5–15)
BUN: 12 mg/dL (ref 8–23)
CO2: 24 mmol/L (ref 22–32)
Calcium: 8.6 mg/dL — ABNORMAL LOW (ref 8.9–10.3)
Chloride: 104 mmol/L (ref 98–111)
Creatinine, Ser: 0.76 mg/dL (ref 0.61–1.24)
GFR, Estimated: >60 mL/min (ref >60)
Glucose, Bld: 124 mg/dL — ABNORMAL HIGH (ref 70–99)
Potassium: 3.8 mmol/L (ref 3.5–5.1)
Sodium: 136 mmol/L (ref 135–145)

## 2023-06-15 LAB — CBC
HCT: 21 % — ABNORMAL LOW (ref 39.0–52.0)
Hemoglobin: 7.1 g/dL — ABNORMAL LOW (ref 13.0–17.0)
MCH: 29.3 pg (ref 26.0–34.0)
MCHC: 33.8 g/dL (ref 30.0–36.0)
MCV: 86.8 fL (ref 80.0–100.0)
Platelets: 289 10*3/uL (ref 150–400)
RBC: 2.42 MIL/uL — ABNORMAL LOW (ref 4.22–5.81)
RDW: 14.4 % (ref 11.5–15.5)
WBC: 10.9 10*3/uL — ABNORMAL HIGH (ref 4.0–10.5)
nRBC: 0 % (ref 0.0–0.2)

## 2023-06-15 LAB — LIPID PANEL
Cholesterol: 91 mg/dL (ref 0–200)
HDL: 29 mg/dL — ABNORMAL LOW (ref >40)
LDL Cholesterol: 54 mg/dL (ref 0–99)
Total CHOL/HDL Ratio: 3.1 {ratio}
Triglycerides: 40 mg/dL (ref <150)
VLDL: 8 mg/dL (ref 0–40)

## 2023-06-15 LAB — HEMOGLOBIN AND HEMATOCRIT, BLOOD
HCT: 22.7 % — ABNORMAL LOW (ref 39.0–52.0)
Hemoglobin: 7.5 g/dL — ABNORMAL LOW (ref 13.0–17.0)

## 2023-06-15 LAB — PREPARE RBC (CROSSMATCH)

## 2023-06-15 NOTE — Evaluation (Signed)
Physical Therapy Evaluation Patient Details Name: [name] [name] MRN: [id number] DOB: [birth year] Today's Date: 06/15/2023  History of Present Illness  Pt is a 72 y.o. male admitted 10/8 for left leg critical limb ischemia. He underwent LLE bypass 10/8. 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 PMH: OA, h/o back sx (2019), h/o neck sx (2001)   Clinical Impression  Pt admitted with above diagnosis. PTA pt lived at home with his wife, mod I mobility/ADLs with SPC. Pt currently with functional limitations due to the deficits listed below (see PT Problem List). On eval, pt required CGA transfers and ambulation 25' with RW. Cues for posture and heel WB LLE. Pt will benefit from acute skilled PT to increase their independence and safety with mobility to allow discharge. Recommend HHPT upon d/c. Pt has all needed DME.          If plan is discharge home, recommend the following: A little help with walking and/or transfers;A little help with bathing/dressing/bathroom   Can travel by private vehicle        Equipment Recommendations None recommended by PT  Recommendations for Other Services       Functional Status Assessment Patient has had a recent decline in their functional status and demonstrates the ability to make significant improvements in function in a reasonable and predictable amount of time.     Precautions / Restrictions Precautions Precautions: Fall Required Braces or Orthoses: Other Brace Other Brace: L orthopedic shoe Restrictions LLE Weight Bearing: Weight bearing as tolerated Other Position/Activity Restrictions: through heel in orthopedic shoe      Mobility  Bed Mobility Overal bed mobility: Modified Independent             General bed mobility comments: +rail, increased time    Transfers Overall transfer level: Needs assistance Equipment used: Rolling walker (2 wheels) Transfers: Sit to/from Stand Sit to Stand: Contact guard assist           General  transfer comment: cues for hand placement and sequencing    Ambulation/Gait Ambulation/Gait assistance: Contact guard assist Gait Distance (Feet): 25 Feet Assistive device: Rolling walker (2 wheels) Gait Pattern/deviations: Step-to pattern, Antalgic, Decreased weight shift to left, Decreased stride length Gait velocity: decreased Gait velocity interpretation: <1.31 ft/sec, indicative of household ambulator   General Gait Details: cues for heel WB LLE and posture. Pt had just ambulated with RN, but agreeable to short in room amb.  Stairs            Wheelchair Mobility     Tilt Bed    Modified Rankin (Stroke Patients Only)       Balance Overall balance assessment: Needs assistance Sitting-balance support: No upper extremity supported, Feet supported Sitting balance-Leahy Scale: Good     Standing balance support: During functional activity, Reliant on assistive device for balance, Bilateral upper extremity supported Standing balance-Leahy Scale: Poor                               Pertinent Vitals/Pain Pain Assessment Pain Assessment: 0-10 Pain Score: 8  Pain Location: LLE Pain Descriptors / Indicators: Discomfort, Shooting Pain Intervention(s): Monitored during session, Repositioned, Limited activity within patient's tolerance    Home Living Family/patient expects to be discharged to:: Private residence Living Arrangements: Spouse/significant other Available Help at Discharge: Family;Available PRN/intermittently Type of Home: House Home Access: Ramped entrance       Home Layout: One level Home Equipment: Agricultural consultant (2 wheels);Cane -  single point      Prior Function Prior Level of Function : Independent/Modified Independent             Mobility Comments: mod I with SPC prior to 10/2 L 2nd ray amp       Extremity/Trunk Assessment   Upper Extremity Assessment Upper Extremity Assessment: Defer to OT evaluation    Lower Extremity  Assessment Lower Extremity Assessment: LLE deficits/detail;Generalized weakness LLE Deficits / Details: s/p 2nd ray amp, s/p bypass    Cervical / Trunk Assessment Cervical / Trunk Assessment: Kyphotic;Other exceptions Cervical / Trunk Exceptions: h/o back sx, h/o neck sx  Communication   Communication Communication: No apparent difficulties  Cognition Arousal: Alert Behavior During Therapy: WFL for tasks assessed/performed Overall Cognitive Status: Within Functional Limits for tasks assessed                                          General Comments General comments (skin integrity, edema, etc.): Hgb 7.1. Pt with order for 1 unit PRBC. Pt with c/o mild lightheadedness during amb.    Exercises     Assessment/Plan    PT Assessment Patient needs continued PT services  PT Problem List Decreased strength;Decreased balance;Decreased knowledge of precautions;Pain;Decreased mobility;Decreased knowledge of use of DME;Decreased activity tolerance       PT Treatment Interventions DME instruction;Functional mobility training;Balance training;Patient/family education;Gait training;Therapeutic activities;Therapeutic exercise    PT Goals (Current goals can be found in the Care Plan section)  Acute Rehab PT Goals Patient Stated Goal: home PT Goal Formulation: With patient Time For Goal Achievement: 06/29/23 Potential to Achieve Goals: Good    Frequency Min 1X/week     Co-evaluation               AM-PAC PT "6 Clicks" Mobility  Outcome Measure Help needed turning from your back to your side while in a flat bed without using bedrails?: None Help needed moving from lying on your back to sitting on the side of a flat bed without using bedrails?: None Help needed moving to and from a bed to a chair (including a wheelchair)?: A Little Help needed standing up from a chair using your arms (e.g., wheelchair or bedside chair)?: A Little Help needed to walk in hospital  room?: A Little Help needed climbing 3-5 steps with a railing? : A Lot 6 Click Score: 19    End of Session Equipment Utilized During Treatment: Gait belt;Other (comment) (L ortho shoe) Activity Tolerance: Patient tolerated treatment well Patient left: in chair;with call bell/phone within reach;with family/visitor present Nurse Communication: Mobility status PT Visit Diagnosis: Difficulty in walking, not elsewhere classified (R26.2);Pain Pain - Right/Left: Left Pain - part of body: Leg    Time: 4259-5638 PT Time Calculation (min) (ACUTE ONLY): 13 min   Charges:   PT Evaluation $PT Eval Moderate Complexity: 1 Mod   PT General Charges $$ ACUTE PT VISIT: 1 Visit         Ferd Glassing., PT  Office # [phone]   Ilda Foil 06/15/2023, 9:06 AM

## 2023-06-15 NOTE — Progress Notes (Addendum)
Mobility Specialist Progress Note:   06/15/23 1608  Mobility  Activity Ambulated with assistance in room  Level of Assistance Contact guard assist, steadying assist  Assistive Device Front wheel walker  Distance Ambulated (ft) 30 ft  LLE Weight Bearing WBAT  Activity Response Tolerated well  Mobility Referral Yes  $Mobility charge 1 Mobility  Mobility Specialist Start Time (ACUTE ONLY) 1550  Mobility Specialist Stop Time (ACUTE ONLY) 1605  Mobility Specialist Time Calculation (min) (ACUTE ONLY) 15 min   Pre Mobility: 78 HR  During Mobility: 92 HR  Post Mobility: 90 HR, 122/62 BP  Pt received in bed, agreeable to mobility. CG to stand and ambulate. Verbal cues required for posture and heel WB precautions. Pt c/o dizziness during ambulation, requesting to return back to bed. VSS. Pt left in bed asymptomatic with call bell in reach and all needs met.   Leory Plowman  Mobility Specialist Please contact via [employer] office at [phone]

## 2023-06-15 NOTE — Progress Notes (Signed)
Pt ambulated approx 50' with one assist and front wheel walker.  Tolerated well. Back to recliner in room.  Call bell in reach, wife at bedside.

## 2023-06-15 NOTE — Progress Notes (Addendum)
  Progress Note    06/15/2023 8:13 AM 1 Day Post-Op  Subjective:  no complaints. Sitting up in chair. Walked in hallway this morning   Vitals:   06/14/23 2234 06/15/23 0418  BP: 117/76 (!) 117/58  Pulse: 98 72  Resp: 20 14  Temp: 98.1 F (36.7 C) 98.2 F (36.8 C)  SpO2: 100% 100%   Physical Exam: Cardiac:  tachy Lungs:  non labored Incisions:  left lower extremity incisions are clean, dry and intact Extremities:  LLE edematous. Left foot dressed. ACE re applied to lower leg. Doppler PT signal Neurologic: alert and oriented  CBC    Component Value Date/Time   WBC 10.9 (H) 06/15/2023 0435   RBC 2.42 (L) 06/15/2023 0435   HGB 7.1 (L) 06/15/2023 0435   HCT 21.0 (L) 06/15/2023 0435   PLT 289 06/15/2023 0435   MCV 86.8 06/15/2023 0435   MCH 29.3 06/15/2023 0435   MCHC 33.8 06/15/2023 0435   RDW 14.4 06/15/2023 0435    BMET    Component Value Date/Time   NA 136 06/15/2023 0435   K 3.8 06/15/2023 0435   CL 104 06/15/2023 0435   CO2 24 06/15/2023 0435   GLUCOSE 124 (H) 06/15/2023 0435   BUN 12 06/15/2023 0435   CREATININE 0.76 06/15/2023 0435   CALCIUM 8.6 (L) 06/15/2023 0435   GFRNONAA >60 06/15/2023 0435   GFRAA >60 10/18/2017 0919    INR    Component Value Date/Time   INR 1.0 06/07/2023 2233     Intake/Output Summary (Last 24 hours) at 06/15/2023 0813 Last data filed at 06/15/2023 0509 Gross per 24 hour  Intake 2280.49 ml  Output 1550 ml  Net 730.49 ml     Assessment/Plan:  72 y.o. male is s/p Left GSV harvest Left SFA to PT artery bypass with reversed GSV Post op Day 1  LLE well perfused and warm. Doppler PT signal ACE wrap to left leg to help with edema Hgb 7.1. Will order 1 Units PRBC Afebrile. No leukocytosis  Cont Abx Augmentin and Levaquin Will consult Dr. Lajoyce Corners to assist in foot management Mobilize as tolerated  DVT prophylaxis:  sq hep   Graceann Congress, PA-C [facility] [phone] 06/15/2023 8:13 AM  VASCULAR  STAFF ADDENDUM: I have independently interviewed and examined the patient. I agree with the above.  1 unit PRBC today.  Multiphasic PT, maximally revascularized at this time.  Consulted Dr. Lajoyce Corners to assist with foot wound management.  Will continue antibiotics and Ace wrap. Okay to ambulate as tolerated  Daria Pastures MD [facility] Phone Number: [phone] 06/15/2023 11:28 AM

## 2023-06-15 NOTE — Evaluation (Signed)
Occupational Therapy Evaluation Patient Details Name: [name] [name] MRN: [id number] DOB: 06-10-1951 Today's Date: 06/15/2023   History of Present Illness Pt is a 72 y.o. male admitted 10/8 for left leg critical limb ischemia. He underwent LLE bypass 10/8. 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 PMH: OA, h/o back sx (2019), h/o neck sx (2001)   Clinical Impression   At baseline, pt is largely Independent to Mod I for ADLs with occasional Min assist needed for LB dressing/bathing due to pain in L LE and performs functional mobility Mod I with a SPC. Pt now presents with decreased activity tolerance, decreased balance during functional tasks, decreased B UE strength and shoulder ROM, L LE pain affecting functional level, and decreased safety and independence with functional tasks. Pt currently demonstrates ability to complete UB ADLs Mod I to Min assist, LB ADLs Contact guard to Min assist, and functional transfers/mobility with a RW with Contact guard assist. Pt will benefit from acute skilled OT services to address deficits outlined below and increase safety and independence with functional tasks. Post acute discharge, pt will benefit from continued skilled OT services in the home to maximize rehab potential.       If plan is discharge home, recommend the following: A little help with walking and/or transfers;A lot of help with bathing/dressing/bathroom;Assistance with cooking/housework;Assist for transportation;Help with stairs or ramp for entrance    Functional Status Assessment  Patient has had a recent decline in their functional status and demonstrates the ability to make significant improvements in function in a reasonable and predictable amount of time.  Equipment Recommendations  Tub/shower seat;Other (comment);BSC/3in1 (Pt would benefit from a tub bench. However, pt reports a tub bench will not fit in his bathroom. Due to this, pt will benefit from a shower chair and tub clamp on bar.)     Recommendations for Other Services       Precautions / Restrictions Precautions Precautions: Fall Required Braces or Orthoses: Other Brace Other Brace: L orthopedic shoe Restrictions Weight Bearing Restrictions: Yes LLE Weight Bearing: Weight bearing as tolerated Other Position/Activity Restrictions: through heel in orthopedic shoe      Mobility Bed Mobility Overal bed mobility: Modified Independent             General bed mobility comments: +rail, increased time    Transfers Overall transfer level: Needs assistance Equipment used: Rolling walker (2 wheels) Transfers: Sit to/from Stand, Bed to chair/wheelchair/BSC Sit to Stand: Contact guard assist     Step pivot transfers: Contact guard assist (with use of RW)     General transfer comment: cues for hand placement and occasional cues for safety      Balance Overall balance assessment: Needs assistance Sitting-balance support: No upper extremity supported, Feet supported Sitting balance-Leahy Scale: Good     Standing balance support: Single extremity supported, Bilateral upper extremity supported, During functional activity, Reliant on assistive device for balance Standing balance-Leahy Scale: Poor                             ADL either performed or assessed with clinical judgement   ADL Overall ADL's : Needs assistance/impaired Eating/Feeding: Modified independent;Sitting   Grooming: Contact guard assist;Standing   Upper Body Bathing: Minimal assistance;Sitting;Cueing for compensatory techniques   Lower Body Bathing: Minimal assistance;Moderate assistance;Cueing for compensatory techniques;Sitting/lateral leans;Sit to/from stand (with increased time)   Upper Body Dressing : Minimal assistance;Cueing for compensatory techniques;Sitting   Lower Body Dressing: Minimal assistance;Sitting/lateral  leans;Sit to/from stand;Cueing for compensatory techniques (with increased time)   Toilet Transfer:  Contact guard assist   Toileting- Clothing Manipulation and Hygiene: Contact guard assist;Sitting/lateral lean;Sit to/from stand       Functional mobility during ADLs: Contact guard assist;Cueing for safety;Rolling walker (2 wheels) General ADL Comments: Pt presents with decreased activity tolerance and pain in L LE affecting functional level.     Vision Baseline Vision/History: 1 Wears glasses (Reports he "doesn't using need them.") Ability to See in Adequate Light: 0 Adequate Patient Visual Report: No change from baseline       Perception         Praxis         Pertinent Vitals/Pain Pain Assessment Pain Assessment: 0-10 Pain Score: 7  Pain Location: LLE Pain Descriptors / Indicators: Discomfort, Shooting, Grimacing Pain Intervention(s): Limited activity within patient's tolerance, Monitored during session, Repositioned, RN gave pain meds during session     Extremity/Trunk Assessment Upper Extremity Assessment Upper Extremity Assessment: Right hand dominant;Generalized weakness;RUE deficits/detail;LUE deficits/detail RUE Deficits / Details: generalized weakness; decreased AROM shoulder flexion to approx. 70 degrees and PROM to approx. 90 degrees; Pt reports ROM is largely at baseline but has decreased over past few months. 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 RUE Sensation: WNL RUE Coordination: WNL LUE Deficits / Details: generalized weakness; decreased AROM shoulder flexion to approx. 90 degrees and PROM to approx. 100 degrees; Pt reports ROM is largely at baseline but has decreased over past few months. 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 LUE Sensation: WNL LUE Coordination: WNL   Lower Extremity Assessment Lower Extremity Assessment: Defer to PT evaluation LLE Deficits / Details: s/p 2nd ray amp, s/p bypass   Cervical / Trunk Assessment Cervical / Trunk Assessment: Kyphotic;Other exceptions Cervical / Trunk Exceptions: h/o back sx, h/o  neck sx   Communication Communication Communication: No apparent difficulties   Cognition Arousal: Alert Behavior During Therapy: WFL for tasks assessed/performed Overall Cognitive Status: Within Functional Limits for tasks assessed                                 General Comments: AAOx4 and pleasant throughout session. Able to follow multi-step commands consistently and demonstrates fair safety awareness and good insight into deficits.     General Comments  VSS on RA throughout session. Pt reported no dizziness or lightheadedness this session.    Exercises     Shoulder Instructions      Home Living Family/patient expects to be discharged to:: Private residence Living Arrangements: Spouse/significant other Available Help at Discharge: Family;Available PRN/intermittently Type of Home: House Home Access: Stairs to enter Entrance Stairs-Number of Steps: 1 Entrance Stairs-Rails: None Home Layout: One level     Bathroom Shower/Tub: Chief Strategy Officer: Standard     Home Equipment: Agricultural consultant (2 wheels);Cane - single point          Prior Functioning/Environment Prior Level of Function : Independent/Modified Independent;Needs assist             Mobility Comments: mod I with SPC prior to 10/2 L 2nd ray amp ADLs Comments: Independent to Min assist at baseline secondary to pain in Left foot        OT Problem List: Decreased strength;Decreased range of motion;Decreased activity tolerance;Impaired balance (sitting and/or standing);Decreased knowledge of use of DME or AE;Pain      OT Treatment/Interventions: Self-care/ADL training;Therapeutic  exercise;Energy conservation;DME and/or AE instruction;Therapeutic activities;Patient/family education;Balance training    OT Goals(Current goals can be found in the care plan section) Acute Rehab OT Goals Patient Stated Goal: To return home and be as independent as possible OT Goal Formulation:  With patient Time For Goal Achievement: 06/29/23 Potential to Achieve Goals: Good ADL Goals Pt Will Perform Lower Body Bathing: with contact guard assist;with adaptive equipment;sitting/lateral leans;sit to/from stand Pt Will Perform Upper Body Dressing: with modified independence;sitting Pt Will Perform Lower Body Dressing: with supervision;with adaptive equipment;sitting/lateral leans;sit to/from stand Pt Will Transfer to Toilet: with modified independence;regular height toilet;grab bars;ambulating (with least restrictive AD) Pt Will Perform Toileting - Clothing Manipulation and hygiene: with modified independence;sit to/from stand;sitting/lateral leans Pt/caregiver will Perform Home Exercise Program: Increased ROM;Increased strength;Both right and left upper extremity;With theraputty;Independently;With written HEP provided;With theraband (with AROM progressing to theraband)  OT Frequency: Min 1X/week    Co-evaluation              AM-PAC OT "6 Clicks" Daily Activity     Outcome Measure Help from another person eating meals?: None Help from another person taking care of personal grooming?: A Little Help from another person toileting, which includes using toliet, bedpan, or urinal?: A Little Help from another person bathing (including washing, rinsing, drying)?: A Lot Help from another person to put on and taking off regular upper body clothing?: A Little Help from another person to put on and taking off regular lower body clothing?: A Little 6 Click Score: 18   End of Session Equipment Utilized During Treatment: Gait belt;Rolling walker (2 wheels) Nurse Communication: Mobility status  Activity Tolerance: Patient tolerated treatment well;Patient limited by pain Patient left: in bed;with call bell/phone within reach  OT Visit Diagnosis: Unsteadiness on feet (R26.81);Other abnormalities of gait and mobility (R26.89);Muscle weakness (generalized) (M62.81)                Time:  4098-1191 OT Time Calculation (min): 35 min Charges:  OT General Charges $OT Visit: 1 Visit OT Evaluation $OT Eval Moderate Complexity: 1 Mod OT Treatments $Self Care/Home Management : 8-22 mins  Yug Loria "Orson Eva., OTR/L, MA Acute Rehab [phone]   Lendon Colonel 06/15/2023, 2:08 PM

## 2023-06-16 DIAGNOSIS — I70222 Atherosclerosis of native arteries of extremities with rest pain, left leg: Secondary | ICD-10-CM

## 2023-06-16 DIAGNOSIS — I70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: Secondary | ICD-10-CM

## 2023-06-16 DIAGNOSIS — I96 Gangrene, not elsewhere classified: Secondary | ICD-10-CM | POA: Diagnosis not present

## 2023-06-16 LAB — TYPE AND SCREEN
ABO/RH(D): B POS
Antibody Screen: NEGATIVE
Unit division: 0

## 2023-06-16 LAB — BPAM RBC
Blood Product Expiration Date: 202411112359
ISSUE DATE / TIME: 202410091013
Unit Type and Rh: 7300

## 2023-06-16 MED ORDER — MEDIHONEY WOUND/BURN DRESSING EX PSTE
1.0000 | PASTE | Freq: Every day | CUTANEOUS | Status: DC
  Administered 2023-06-16 – 2023-06-19 (×3): 1 via TOPICAL
  Filled 2023-06-16 (×2): qty 44

## 2023-06-16 NOTE — Consult Note (Signed)
ORTHOPAEDIC CONSULTATION  REQUESTING PHYSICIAN: Daria Pastures, MD  Chief Complaint: Dry gangrene left forefoot.  HPI: [name] [name] is a 72 y.o. male who presents with dry gangrene left forefoot.  Patient underwent second ray amputation and even.  Postoperatively was determined that patient had critical limb ischemia with an ABI of 0.  Patient underwent serial revascularization procedures to the left lower extremity and patient is seen in consultation for the ischemic changes to the left forefoot.  Patient has a history of type 2 diabetes with hypertension and prolonged tobacco use.  Past Medical History:  Diagnosis Date   Arthritis    Diabetes mellitus without complication (HCC)    GERD (gastroesophageal reflux disease)    Headache    Peripheral vascular disease (HCC)    Past Surgical History:  Procedure Laterality Date   ABDOMINAL AORTOGRAM W/LOWER EXTREMITY N/A 06/08/2023   Procedure: ABDOMINAL AORTOGRAM W/LOWER EXTREMITY;  Surgeon: Victorino Sparrow, MD;  Location: [facility] INVASIVE CV LAB;  Service: Cardiovascular;  Laterality: N/A;   APPENDECTOMY  1956   BACK SURGERY     COLONOSCOPY  1997   FEMORAL-TIBIAL BYPASS GRAFT Left 06/14/2023   Procedure: LEFT SUPERFICIAL FEMORAL-POSTERIOR TIBIAL ARTERY BYPASS;  Surgeon: Daria Pastures, MD;  Location: [facility] OR;  Service: Vascular;  Laterality: Left;   KNEE ARTHROSCOPY Left    LUMBAR LAMINECTOMY/DECOMPRESSION MICRODISCECTOMY Bilateral 10/18/2017   Procedure: Bilateral Lumbar Two-Three Lumbar Three-Four Laminotomies;  Surgeon: Barnett Abu, MD;  Location: [facility] OR;  Service: Neurosurgery;  Laterality: Bilateral;  Bilateral L2-3 L3-4 Laminotomies   NECK SURGERY  2001   PERIPHERAL VASCULAR INTERVENTION  06/08/2023   Procedure: PERIPHERAL VASCULAR INTERVENTION;  Surgeon: Victorino Sparrow, MD;  Location: [facility] INVASIVE CV LAB;  Service: Cardiovascular;;  Lt Common Iliac   VEIN HARVEST Left 06/14/2023   Procedure: SAPHENOUS VEIN HARVEST;  Surgeon:  Daria Pastures, MD;  Location: [facility] OR;  Service: Vascular;  Laterality: Left;   Social History   Socioeconomic History   Marital status: Widowed    Spouse name: Not on file   Number of children: 3   Years of education: Not on file   Highest education level: Not on file  Occupational History   Not on file  Tobacco Use   Smoking status: Former    Current packs/day: 0.00    Average packs/day: 0.3 packs/day for 59.0 years (14.8 ttl pk-yrs)    Types: Cigarettes    Start date: 23    Quit date: 06/01/2023    Years since quitting: 0.0   Smokeless tobacco: Never  Vaping Use   Vaping status: Never Used  Substance and Sexual Activity   Alcohol use: Not Currently    Comment: occ   Drug use: No   Sexual activity: Not on file  Other Topics Concern   Not on file  Social History Narrative   Not on file   Social Determinants of Health   Financial Resource Strain: Low Risk  (06/02/2023)   Received from [facility]   Overall Financial Resource Strain (CARDIA)    Difficulty of Paying Living Expenses: Not very hard  Food Insecurity: No Food Insecurity (06/08/2023)   Hunger Vital Sign    Worried About Running Out of Food in the Last Year: Never true    Ran Out of Food in the Last Year: Never true  Transportation Needs: No Transportation Needs (06/08/2023)   PRAPARE - Transportation    Lack of Transportation (Medical): No    Lack of  Transportation (Non-Medical): No  Physical Activity: Not on file  Stress: Not on file  Social Connections: Not on file   Family History  Problem Relation Age of Onset   Stroke Mother    Cancer Father    Heart disease Sister    Cancer Sister    - negative except otherwise stated in the family history section Allergies  Allergen Reactions   Gadobutrol Hives, Itching and Swelling    After injection of MRI contrast Gadavist pt experienced facial itching, stuffy nose, eye swelling   Iodinated Contrast Media Hives   Prior to Admission medications    Medication Sig Start Date End Date Taking? Authorizing Provider  amLODipine (NORVASC) 5 MG tablet Take 1 tablet (5 mg total) by mouth daily. 06/10/23  Yes Marlin Canary U, DO  amoxicillin-clavulanate (AUGMENTIN) 875-125 MG tablet Take 1 tablet by mouth every 12 (twelve) hours. 06/09/23  Yes Joseph Art, DO  Ascorbic Acid 1000 MG TBCR Take 1,000 mg by mouth in the morning and at bedtime. 06/03/23  Yes [provider]  aspirin 81 MG chewable tablet Chew 1 tablet (81 mg total) by mouth daily. 06/10/23  Yes Vann, Jessica U, DO  atorvastatin (LIPITOR) 40 MG tablet Take 1 tablet (40 mg total) by mouth daily. 06/10/23  Yes Joseph Art, DO  clopidogrel (PLAVIX) 75 MG tablet Take 1 tablet (75 mg total) by mouth daily with breakfast. 06/10/23  Yes Vann, Jessica U, DO  HYDROcodone-acetaminophen (NORCO/VICODIN) 5-325 MG tablet Take 1 tablet by mouth every 6 (six) hours as needed for moderate pain. 06/09/23  Yes Vann, Jessica U, DO  levofloxacin (LEVAQUIN) 500 MG tablet Take 1 tablet (500 mg total) by mouth daily. 06/09/23  Yes Vann, Jessica U, DO  pantoprazole (PROTONIX) 40 MG tablet Take 1 tablet (40 mg total) by mouth daily. 06/10/23  Yes Vann, Jessica U, DO  vitamin B-12 (CYANOCOBALAMIN) 1000 MCG tablet Take 1,000 mcg by mouth daily.   Yes [provider]  vitamin E 100 UNIT capsule Take 100 Units by mouth daily.   Yes [provider]  Zinc Acetate 50 MG CAPS Take 50 mg by mouth in the morning. 06/03/23  Yes [provider]   PERIPHERAL VASCULAR CATHETERIZATION  Result Date: 06/14/2023 See surgical note for result.  - pertinent xrays, CT, MRI studies were reviewed and independently interpreted  Positive ROS: All other systems have been reviewed and were otherwise negative with the exception of those mentioned in the HPI and as above.  Physical Exam: General: Alert, no acute distress Psychiatric: Patient is competent for consent with normal mood and affect Lymphatic:  No axillary or cervical lymphadenopathy Cardiovascular: No pedal edema Respiratory: No cyanosis, no use of accessory musculature GI: No organomegaly, abdomen is soft and non-tender    Images:  @ENCIMAGES @  Labs:  Lab Results  Component Value Date   HGBA1C 6.7 (H) 06/07/2023    Lab Results  Component Value Date   ALBUMIN 3.3 (L) 06/07/2023        Latest Ref Rng & Units 06/15/2023    7:51 PM 06/15/2023    4:35 AM 06/14/2023    7:50 PM  CBC EXTENDED  WBC 4.0 - 10.5 K/uL  10.9    RBC 4.22 - 5.81 MIL/uL  2.42    Hemoglobin 13.0 - 17.0 g/dL 7.5  7.1  7.6   HCT 29.5 - 52.0 % 22.7  21.0  22.3   Platelets 150 - 400 K/uL  289  Neurologic: Patient does not have protective sensation bilateral lower extremities.   MUSCULOSKELETAL:   Skin: Examination patient has ischemic changes to the left great toe ischemic changes to the second toe amputation and ischemic gangrenous changes to the third toe.  By Doppler patient has a dampened monophasic dorsalis pedis pulse.  There is no ascending cellulitis.  He does have swelling in the left lower extremity.  Assessment: Assessment: Diabetes with peripheral vascular disease with history of tobacco use status post revascularization to the left lower extremity with ischemic changes to the left forefoot.  Plan: Plan: I have recommended proceeding with a transmetatarsal amputation.  Patient states he does not want to consider any type of surgical amputation at this time.  I will place orders for wound care and will follow patient to see how the wound develops.  Discussed that we could proceed with surgery at a later date as the wound declares itself.  Feel the patient has the best opportunity for wound healing with sooner rather than later surgical intervention.  Thank you for the consult and the opportunity to see Mr. [name] Molzahn, MD [facility] [phone] 8:25 AM

## 2023-06-16 NOTE — Progress Notes (Signed)
Mobility Specialist Progress Note:   06/16/23 1320  Mobility  Activity Ambulated with assistance in hallway  Level of Assistance Contact guard assist, steadying assist  Assistive Device Front wheel walker  Distance Ambulated (ft) 80 ft  LLE Weight Bearing WBAT  Activity Response Tolerated well  Mobility Referral Yes  $Mobility charge 1 Mobility  Mobility Specialist Start Time (ACUTE ONLY) 1249  Mobility Specialist Stop Time (ACUTE ONLY) 1303  Mobility Specialist Time Calculation (min) (ACUTE ONLY) 14 min   Pre Mobility: 90 HR  During Mobility: 103 HR    Pt received in chair, agreeable to mobility. SB to stand. CG during ambulation. Pt c/o leg weakness towards EOS and displayed slight knee buckle when returning to chair. Pt denied any other discomfort, asymptomatic throughout. Pt left in chair with call bell in reach and all needs met.   Leory Plowman  Mobility Specialist Please contact via [employer] office at [phone]

## 2023-06-16 NOTE — Progress Notes (Signed)
Physical Therapy Treatment Patient Details Name: [name] [name] MRN: [id number] DOB: 11/12/1950 Today's Date: 06/16/2023   History of Present Illness Pt is a 72 y.o. male admitted 10/8 for left leg critical limb ischemia. He underwent LLE bypass 10/8. 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 PMH: OA, h/o back sx (2019), h/o neck sx (2001)    PT Comments  Pt received sitting in the recliner and agreeable to session. Pt reporting recent ambulation with mobility, but is agreeable to short gait distance in room.  Pt requiring frequent cues for LLE heel WB due to impaired L dorsiflexion and for increased B foot clearance for safety.  Pt instructed in seated BLE exercises and encouraged to perform independently for strengthening with pt verbalizing understanding. Pt continues to benefit from PT services to progress toward functional mobility goals.    If plan is discharge home, recommend the following: A little help with walking and/or transfers;A little help with bathing/dressing/bathroom   Can travel by private vehicle        Equipment Recommendations  None recommended by PT    Recommendations for Other Services       Precautions / Restrictions Precautions Precautions: Fall Required Braces or Orthoses: Other Brace Other Brace: L orthopedic shoe Restrictions Weight Bearing Restrictions: Yes LLE Weight Bearing: Weight bearing as tolerated Other Position/Activity Restrictions: through heel in orthopedic shoe     Mobility  Bed Mobility               General bed mobility comments: Pt beginning and ending session in recliner    Transfers Overall transfer level: Needs assistance Equipment used: Rolling walker (2 wheels) Transfers: Sit to/from Stand Sit to Stand: Contact guard assist           General transfer comment: from recliner with good power up and CGA for safety    Ambulation/Gait Ambulation/Gait assistance: Contact guard assist Gait Distance (Feet): 30  Feet Assistive device: Rolling walker (2 wheels) Gait Pattern/deviations: Step-to pattern, Antalgic, Decreased weight shift to left, Decreased stride length, Decreased dorsiflexion - left Gait velocity: decreased     General Gait Details: Pt demonstrating difficulty dorsiflexing L ankle during ambulation and decreased B foot clearance with progressed distance. Increased instability with fatigue. Cues for increased BUE support to offload LLE and upright posture      Balance Overall balance assessment: Needs assistance Sitting-balance support: No upper extremity supported, Feet supported Sitting balance-Leahy Scale: Good     Standing balance support: Single extremity supported, Bilateral upper extremity supported, During functional activity, Reliant on assistive device for balance Standing balance-Leahy Scale: Poor Standing balance comment: with RW support                            Cognition Arousal: Alert Behavior During Therapy: WFL for tasks assessed/performed Overall Cognitive Status: Within Functional Limits for tasks assessed                                          Exercises General Exercises - Lower Extremity Long Arc Quad: AROM, Seated, Both, 10 reps Heel Slides: AAROM, Left, Seated, 5 reps Straight Leg Raises: AROM, Seated, Right, 5 reps Hip Flexion/Marching: AROM, Seated, Both, 10 reps    General Comments        Pertinent Vitals/Pain Pain Assessment Pain Assessment: Faces Faces Pain Scale: Hurts little more Pain Descriptors /  Indicators: Discomfort, Shooting, Grimacing Pain Intervention(s): Limited activity within patient's tolerance, Monitored during session, Repositioned     PT Goals (current goals can now be found in the care plan section) Acute Rehab PT Goals Patient Stated Goal: home PT Goal Formulation: With patient Time For Goal Achievement: 06/29/23 Progress towards PT goals: Progressing toward goals     Frequency    Min 1X/week       AM-PAC PT "6 Clicks" Mobility   Outcome Measure  Help needed turning from your back to your side while in a flat bed without using bedrails?: None Help needed moving from lying on your back to sitting on the side of a flat bed without using bedrails?: None Help needed moving to and from a bed to a chair (including a wheelchair)?: A Little Help needed standing up from a chair using your arms (e.g., wheelchair or bedside chair)?: A Little Help needed to walk in hospital room?: A Little Help needed climbing 3-5 steps with a railing? : A Lot 6 Click Score: 19    End of Session Equipment Utilized During Treatment: Gait belt;Other (comment) (L ortho shoe) Activity Tolerance: Patient tolerated treatment well Patient left: in chair;with call bell/phone within reach;with family/visitor present Nurse Communication: Mobility status PT Visit Diagnosis: Difficulty in walking, not elsewhere classified (R26.2);Pain Pain - Right/Left: Left Pain - part of body: Leg     Time: 3875-6433 PT Time Calculation (min) (ACUTE ONLY): 13 min  Charges:    $Gait Training: 8-22 mins PT General Charges $$ ACUTE PT VISIT: 1 Visit                     [name], PTA Acute Rehabilitation Services Secure Chat Preferred  [phone]    [name] 06/16/2023, 1:52 PM

## 2023-06-16 NOTE — Progress Notes (Addendum)
  Progress Note    06/16/2023 7:50 AM 2 Days Post-Op  Subjective:  no major complaints    Vitals:   06/15/23 2321 06/16/23 0744  BP: (!) 131/57 115/66  Pulse: 85 86  Resp: 16 15  Temp: 98.3 F (36.8 C) 98.1 F (36.7 C)  SpO2: 100% 98%   Physical Exam: Cardiac:  regular Lungs:  non labored Incisions:  Left leg incisions are clean, dry and intact without swelling or hematoma Extremities:  LLE remains well perfused and warm with Doppler PT signal Neurologic: alert and oriented  CBC    Component Value Date/Time   WBC 10.9 (H) 06/15/2023 0435   RBC 2.42 (L) 06/15/2023 0435   HGB 7.5 (L) 06/15/2023 1951   HCT 22.7 (L) 06/15/2023 1951   PLT 289 06/15/2023 0435   MCV 86.8 06/15/2023 0435   MCH 29.3 06/15/2023 0435   MCHC 33.8 06/15/2023 0435   RDW 14.4 06/15/2023 0435    BMET    Component Value Date/Time   NA 136 06/15/2023 0435   K 3.8 06/15/2023 0435   CL 104 06/15/2023 0435   CO2 24 06/15/2023 0435   GLUCOSE 124 (H) 06/15/2023 0435   BUN 12 06/15/2023 0435   CREATININE 0.76 06/15/2023 0435   CALCIUM 8.6 (L) 06/15/2023 0435   GFRNONAA >60 06/15/2023 0435   GFRAA >60 10/18/2017 0919    INR    Component Value Date/Time   INR 1.0 06/07/2023 2233     Intake/Output Summary (Last 24 hours) at 06/16/2023 0750 Last data filed at 06/15/2023 2327 Gross per 24 hour  Intake 435 ml  Output 400 ml  Net 35 ml     Assessment/Plan:  72 y.o. male is s/p Left GSV harvest; left SFA to PT bypass with reversed GSV 2 Days Post-Op   Left leg remains well perfused with Doppler PT signal Left leg incisions are intact and well appearing Hgb 7.5 post transfusion of 1 unit. Will monitor Continue Abx Appreciate Dr. Audrie Lia recs regarding left foot wound Continue to mobilize PT/OT recommending HH. Orders placed   Graceann Congress, PA-C [facility] [phone] 06/16/2023 7:50 AM  VASCULAR STAFF ADDENDUM: I have independently interviewed and examined  the patient. I agree with the above.  Strong multiphasic PT signal at ankle and on lateral foot.  2nd toe amp site pale. May require debridement vs further amputation, appreciate Dr. Audrie Lia recs Dr. Lajoyce Corners to place wound care orders. Please ace wrap from foot to knee after wound is dressed. Continue abx Continue PT/OT   Daria Pastures MD [facility] Phone Number: [phone] 06/16/2023 8:17 AM

## 2023-06-17 LAB — CBC
HCT: 22.3 % — ABNORMAL LOW (ref 39.0–52.0)
Hemoglobin: 7.3 g/dL — ABNORMAL LOW (ref 13.0–17.0)
MCH: 28.4 pg (ref 26.0–34.0)
MCHC: 32.7 g/dL (ref 30.0–36.0)
MCV: 86.8 fL (ref 80.0–100.0)
Platelets: 312 10*3/uL (ref 150–400)
RBC: 2.57 MIL/uL — ABNORMAL LOW (ref 4.22–5.81)
RDW: 14.5 % (ref 11.5–15.5)
WBC: 11.5 10*3/uL — ABNORMAL HIGH (ref 4.0–10.5)
nRBC: 0.2 % (ref 0.0–0.2)

## 2023-06-17 NOTE — Progress Notes (Signed)
Occupational Therapy Treatment Patient Details Name: [name] [name] MRN: [id number] DOB: Oct 05, 1950 Today's Date: 06/17/2023   History of present illness Pt is a 72 y.o. male admitted 10/8 for left leg critical limb ischemia. He underwent LLE bypass 10/8. 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 PMH: OA, h/o back sx (2019), h/o neck sx (2001)   OT comments  OT session focused on training in use of AE and compensatory strategies for increased safety and independence with ADLs. Pt participated well in session and demonstrated understanding of all training through teach back and will benefit from reinforcement of training. Pt currently demonstrates ability to complete UB ADLS with Mod I to Contact guard assist with use of AE and compensatory strategies and LB ADLs with Contact guard assist with use of AE. Pt is making good progress toward goals. Pt will benefit from continued acute skilled OT services to address deficits outlined below and increase safety and independence with functional tasks. Post acute discharge, pt will benefit from continued skilled OT services in the home to maximize rehab potential.       If plan is discharge home, recommend the following:  A little help with walking and/or transfers;A little help with bathing/dressing/bathroom;Assistance with cooking/housework;Assist for transportation;Help with stairs or ramp for entrance   Equipment Recommendations  Tub/shower seat;BSC/3in1 (Pt would benefit from a tub bench. However, pt reports a tub bench will not fit in his bathroom. Due to this, pt will benefit from a shower chair and tub clamp on bar.)    Recommendations for Other Services      Precautions / Restrictions Precautions Precautions: Fall Restrictions Weight Bearing Restrictions: Yes LLE Weight Bearing: Weight bearing as tolerated Other Position/Activity Restrictions: through heel in orthopedic shoe       Mobility Bed Mobility Overal bed mobility: Modified  Independent                  Transfers Overall transfer level: Needs assistance Equipment used: Rolling walker (2 wheels) Transfers: Sit to/from Stand Sit to Stand: Contact guard assist                 Balance Overall balance assessment: Needs assistance Sitting-balance support: No upper extremity supported, Feet supported Sitting balance-Leahy Scale: Good     Standing balance support: Single extremity supported, Bilateral upper extremity supported, During functional activity, Reliant on assistive device for balance Standing balance-Leahy Scale: Poor Standing balance comment: with RW support                           ADL either performed or assessed with clinical judgement   ADL Overall ADL's : Needs assistance/impaired Eating/Feeding: Modified independent;Sitting   Grooming: Contact guard assist;Standing   Upper Body Bathing: Supervision/ safety;With adaptive equipment;Sitting   Lower Body Bathing: Contact guard assist;Cueing for compensatory techniques;Sit to/from stand;Sitting/lateral leans;With adaptive equipment   Upper Body Dressing : Contact guard assist;Cueing for compensatory techniques;Sitting   Lower Body Dressing: Contact guard assist;With adaptive equipment;Cueing for compensatory techniques;Sitting/lateral leans;Sit to/from stand                 General ADL Comments: OT educated pt in use of AE (reacher, long handled sponge, long handled shoe horn) for increased safety and independence with ADLs with pt demonstrating understanding of all training through teach back with increased time and occasional cues for technique.    Extremity/Trunk Assessment Upper Extremity Assessment Upper Extremity Assessment: Right hand dominant;Generalized weakness;RUE deficits/detail;LUE deficits/detail RUE Deficits /  Details: generalized weakness; decreased AROM shoulder flexion to approx. 70 degrees and PROM to approx. 90 degrees; Pt reports ROM is  largely at baseline but has decreased over past few months. 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 LUE Deficits / Details: generalized weakness; decreased AROM shoulder flexion to approx. 90 degrees and PROM to approx. 100 degrees; Pt reports ROM is largely at baseline but has decreased over past few months. 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   Lower Extremity Assessment Lower Extremity Assessment: Defer to PT evaluation        Vision       Perception     Praxis      Cognition Arousal: Alert Behavior During Therapy: [facility] for tasks assessed/performed Overall Cognitive Status: Within Functional Limits for tasks assessed                                 General Comments: AAOx4 and pleasant throughout session. Able to follow multi-step commands consistently and demonstrates fair safety awareness and good insight into deficits.        Exercises      Shoulder Instructions       General Comments Pt's wife present throughout session.    Pertinent Vitals/ Pain       Pain Assessment Pain Assessment: Faces Faces Pain Scale: Hurts little more Pain Location: L foot Pain Descriptors / Indicators: Discomfort, Shooting, Grimacing, Aching Pain Intervention(s): Limited activity within patient's tolerance, Monitored during session, Repositioned  Home Living                                          Prior Functioning/Environment              Frequency  Min 1X/week        Progress Toward Goals  OT Goals(current goals can now be found in the care plan section)  Progress towards OT goals: Progressing toward goals  Acute Rehab OT Goals Patient Stated Goal: to have less pain in his foot and continue to heal well  Plan      Co-evaluation                 AM-PAC OT "6 Clicks" Daily Activity     Outcome Measure   Help from another person eating meals?: None Help from another person taking care of personal  grooming?: A Little Help from another person toileting, which includes using toliet, bedpan, or urinal?: A Little Help from another person bathing (including washing, rinsing, drying)?: A Little (with use of AE) Help from another person to put on and taking off regular upper body clothing?: A Little Help from another person to put on and taking off regular lower body clothing?: A Little 6 Click Score: 19    End of Session Equipment Utilized During Treatment: Rolling walker (2 wheels);Other (comment) (off-loading orthopedic shoe, longhandled sponge, longhandled shoe horn, reacher)  OT Visit Diagnosis: Other abnormalities of gait and mobility (R26.89);Unsteadiness on feet (R26.81);Muscle weakness (generalized) (M62.81)   Activity Tolerance Patient tolerated treatment well   Patient Left in bed;with call bell/phone within reach;with family/visitor present   Nurse Communication Mobility status        Time: 2956-2130 OT Time Calculation (min): 31 min  Charges: OT General Charges $OT Visit: 1 Visit OT Treatments $Self Care/Home  Management : 23-37 mins  Deserie Dirks "Orson Eva., OTR/L, [place] [facility] [phone]   Lendon Colonel 06/17/2023, 4:37 PM

## 2023-06-17 NOTE — Progress Notes (Addendum)
  Progress Note    06/17/2023 8:07 AM 3 Days Post-Op  Subjective:  no complaints. Tearful regarding his foot. Wife at bedside   Vitals:   06/16/23 2353 06/17/23 0414  BP: (!) 132/55 (!) 138/52  Pulse: 86 88  Resp: 18 17  Temp: 98.2 F (36.8 C) 98.1 F (36.7 C)  SpO2: 100% 100%   Physical Exam: Cardiac: regular Lungs:  non labored Incisions:  left leg incisions are clean, dry and intact Extremities:  left leg well perfused and warm with doppler PT signals Neurologic: alert and oriented  CBC    Component Value Date/Time   WBC 11.5 (H) 06/17/2023 0525   RBC 2.57 (L) 06/17/2023 0525   HGB 7.3 (L) 06/17/2023 0525   HCT 22.3 (L) 06/17/2023 0525   PLT 312 06/17/2023 0525   MCV 86.8 06/17/2023 0525   MCH 28.4 06/17/2023 0525   MCHC 32.7 06/17/2023 0525   RDW 14.5 06/17/2023 0525    BMET    Component Value Date/Time   NA 136 06/15/2023 0435   K 3.8 06/15/2023 0435   CL 104 06/15/2023 0435   CO2 24 06/15/2023 0435   GLUCOSE 124 (H) 06/15/2023 0435   BUN 12 06/15/2023 0435   CREATININE 0.76 06/15/2023 0435   CALCIUM 8.6 (L) 06/15/2023 0435   GFRNONAA >60 06/15/2023 0435   GFRAA >60 10/18/2017 0919    INR    Component Value Date/Time   INR 1.0 06/07/2023 2233     Intake/Output Summary (Last 24 hours) at 06/17/2023 0807 Last data filed at 06/17/2023 0527 Gross per 24 hour  Intake 840 ml  Output 1875 ml  Net -1035 ml     Assessment/Plan:  72 y.o. male is s/p Left GSV harvest; left SFA to PT bypass with reversed GSV  3 Days Post-Op   Left lower extremity well perfused and warm with PT doppler signal Incisions are intact and well appearing Continue ACE wrap to left leg to help with edema Left foot dressed He remains undecided about proceeding with TMA Appreciate Dr. Audrie Lia input Hgb 7.3. VSS Continue to mobilize as tolerated Continue PT/OT    Graceann Congress, PA-C [facility] [phone] 06/17/2023 8:07 AM  VASCULAR STAFF  ADDENDUM: I have independently interviewed and examined the patient. I agree with the above.  Plan to continue to assess wound daily. At the very least wound likely need 2nd toe amp site debridement. Continue PT/OT and ambulate as tolerated.   Daria Pastures MD [facility] Phone Number: [phone] 06/17/2023 2:50 PM

## 2023-06-17 NOTE — Care Management Important Message (Signed)
Important Message  Patient Details  Name: [name] [name] MRN: [id number] Date of Birth: [birth year]   Important Message Given:  Yes - Medicare IM     Sherilyn Banker 06/17/2023, 1:44 PM

## 2023-06-17 NOTE — Progress Notes (Addendum)
Mobility Specialist Progress Note:   06/17/23 1036  Mobility  Activity Dangled on edge of bed  Level of Assistance Standby assist, set-up cues, supervision of patient - no hands on  LLE Weight Bearing WBAT  Activity Response Tolerated well  Mobility Referral Yes  $Mobility charge 1 Mobility  Mobility Specialist Start Time (ACUTE ONLY) 0957  Mobility Specialist Stop Time (ACUTE ONLY) 1005  Mobility Specialist Time Calculation (min) (ACUTE ONLY) 8 min    Pt received in bed, declining OOB activity d/t LLE pain. Requesting to come back later for ambulation. Pt left EOB with call bell in reach and all needs met. Wife present.    Leory Plowman  Mobility Specialist Please contact via [employer] office at [phone]

## 2023-06-17 NOTE — TOC Initial Note (Signed)
Transition of Care (TOC) - Initial/Assessment Note  Donn Pierini RN, BSN Transitions of Care Unit 4E- RN Case Manager See Treatment Team for direct phone #   Transition of Care [facility]) - Inpatient Brief Assessment   Patient Details  Name: [name] [name] MRN: [id number] Date of Birth: [birth year]  Transition of Care [facility]) CM/SW Contact:    Darrold Span, RN Phone Number: 06/17/2023, 3:24 PM   Clinical Narrative: Pt s/p femtib bypass, non healing wounds, [facility] following for resumption of HH services on discharge.  We will continue to monitor patient advancement through interdisciplinary progression rounds. If new patient transition needs arise, please place a TOC consult.   Transition of Care Asessment: Insurance and Status: Insurance coverage has been reviewed Patient has primary care physician: Yes Home environment has been reviewed: home with spouse Prior level of function:: min. assist Prior/Current Home Services: Current home services (active with [facility]- Rn/PT/OT) Social Determinants of Health Reivew: SDOH reviewed no interventions necessary Readmission risk has been reviewed: Yes Transition of care needs: transition of care needs identified, TOC will continue to follow      Expected Discharge Plan: Home w Home Health Services Barriers to Discharge: Continued Medical Work up   Patient Goals and CMS Choice Patient states their goals for this hospitalization and ongoing recovery are:: return home   Choice offered to / list presented to : Patient      Expected Discharge Plan and Services   Discharge Planning Services: CM Consult Post Acute Care Choice: (P) Home Health, Resumption of Svcs/PTA Provider Living arrangements for the past 2 months: Single Family Home                 DME Arranged: (P) N/A DME Agency: (P) NA       HH Arranged: RN, PT, OT HH Agency: [facility] Date HH Agency Contacted: 06/16/23 Time HH Agency Contacted:  1600 Representative spoke with at [facility] Agency: Angie  Prior Living Arrangements/Services Living arrangements for the past 2 months: Single Family Home Lives with:: Self, Spouse Patient language and need for interpreter reviewed:: Yes Do you feel safe going back to the place where you live?: Yes      Need for Family Participation in Patient Care: Yes (Comment) Care giver support system in place?: Yes (comment) Current home services: DME Criminal Activity/Legal Involvement Pertinent to Current Situation/Hospitalization: No - Comment as needed  Activities of Daily Living   ADL Screening (condition at time of admission) Independently performs ADLs?: Yes (appropriate for developmental age) Is the patient deaf or have difficulty hearing?: No Does the patient have difficulty seeing, even when wearing glasses/contacts?: No Does the patient have difficulty concentrating, remembering, or making decisions?: No  Permission Sought/Granted   Permission granted to share information with : Yes, Verbal Permission Granted              Emotional Assessment         Alcohol / Substance Use: Not Applicable Psych Involvement: No (comment)  Admission diagnosis:  Limb ischemia [I99.8] Critical limb ischemia of left lower extremity (HCC) [I70.222] Patient Active Problem List   Diagnosis Date Noted   Gangrene of toe of left foot (HCC) 06/16/2023   Critical limb ischemia of left lower extremity with ulceration of foot (HCC) 06/14/2023   Essential hypertension 06/08/2023   Normocytic anemia 06/08/2023   Hypokalemia 06/08/2023   Controlled type 2 diabetes mellitus without complication, without long-term current use of insulin (HCC) 06/08/2023   Hyperlipidemia 06/08/2023  Tobacco abuse 06/08/2023   GERD (gastroesophageal reflux disease) 06/08/2023   PAD (peripheral artery disease) (HCC) 06/07/2023   Lumbar stenosis with neurogenic claudication 10/18/2017   PCP:  Ignatius Specking, MD Pharmacy:    [facility] [phone] - [place], [place] - [address] AT [address] [facility] [address] [place] [address] Phone: [phone] Fax: [phone]     Social Determinants of Health (SDOH) Social History: SDOH Screenings   Food Insecurity: No Food Insecurity (06/08/2023)  Housing: Low Risk  (06/08/2023)  Transportation Needs: No Transportation Needs (06/08/2023)  Utilities: Not At Risk (06/08/2023)  Financial Resource Strain: Low Risk  (06/02/2023)   Received from [facility]  Tobacco Use: Medium Risk (06/14/2023)  Health Literacy: Medium Risk (06/02/2023)   Received [facility]   SDOH Interventions:     Readmission Risk Interventions     No data to display

## 2023-06-17 NOTE — Progress Notes (Signed)
Mobility Specialist Progress Note:   06/17/23 1422  Mobility  Activity Ambulated with assistance in room  Level of Assistance Minimal assist, patient does 75% or more  Assistive Device Front wheel walker  Distance Ambulated (ft) 30 ft  LLE Weight Bearing WBAT  Activity Response Tolerated well  Mobility Referral Yes  $Mobility charge 1 Mobility  Mobility Specialist Start Time (ACUTE ONLY) 1400  Mobility Specialist Stop Time (ACUTE ONLY) 1420  Mobility Specialist Time Calculation (min) (ACUTE ONLY) 20 min   Pre Mobility: 80 HR  During Mobility: 90 HR  Post Mobility: 81 HR   Pt received in bed, c/o LLE pain but agreeable to mobility. Upon standing pt displayed backwards lean requiring MinA for correction. Pt able walk to door from bedside with CG for safety. Pt denied any other discomfort, asymptomatic throughout. Pt left in bed with call bell in reach and all needs met. Family present.   Leory Plowman  Mobility Specialist Please contact via [employer] office at [phone]

## 2023-06-18 LAB — HEMOGLOBIN AND HEMATOCRIT, BLOOD
HCT: 23.4 % — ABNORMAL LOW (ref 39.0–52.0)
Hemoglobin: 8 g/dL — ABNORMAL LOW (ref 13.0–17.0)

## 2023-06-18 MED ORDER — NAPHAZOLINE-GLYCERIN 0.012-0.25 % OP SOLN
1.0000 [drp] | Freq: Four times a day (QID) | OPHTHALMIC | Status: DC | PRN
  Filled 2023-06-18 (×2): qty 15

## 2023-06-18 NOTE — Plan of Care (Signed)
  Problem: Activity: Goal: Ability to tolerate increased activity will improve Outcome: Progressing   Problem: Clinical Measurements: Goal: Postoperative complications will be avoided or minimized Outcome: Progressing   Problem: Pain Managment: Goal: General experience of comfort will improve Outcome: Progressing   Problem: Safety: Goal: Ability to remain free from injury will improve Outcome: Progressing   Problem: Education: Goal: Understanding of CV disease, CV risk reduction, and recovery process will improve Outcome: Not Progressing   Problem: Activity: Goal: Ability to return to baseline activity level will improve Outcome: Not Progressing   Problem: Education: Goal: Knowledge of General Education information will improve Description: Including pain rating scale, medication(s)/side effects and non-pharmacologic comfort measures Outcome: Not Progressing

## 2023-06-18 NOTE — Plan of Care (Signed)
Problem: Activity: Goal: Ability to return to baseline activity level will improve Outcome: Progressing   Problem: Cardiovascular: Goal: Ability to achieve and maintain adequate cardiovascular perfusion will improve Outcome: Progressing Goal: Vascular access site(s) Level 0-1 will be maintained Outcome: Progressing

## 2023-06-18 NOTE — Progress Notes (Addendum)
  Progress Note    06/18/2023 9:08 AM 4 Days Post-Op  Subjective:  says foot hurting a little more this morning. Assisted patient to bathroom with rolling walker. Tolerated ambulating well with Darco shoe   Vitals:   06/18/23 0405 06/18/23 0731  BP: 126/60 (!) 124/54  Pulse: 65 78  Resp: 14 18  Temp: 97.8 F (36.6 C) 98 F (36.7 C)  SpO2: 97% 98%   Physical Exam: Cardiac:  regular Lungs:  non labored Incisions:  Left leg incisions are intact and well appearing Extremities:  LLE well perfused and warm. Left foot dressed Neurologic: alert and oriented  CBC    Component Value Date/Time   WBC 11.5 (H) 06/17/2023 0525   RBC 2.57 (L) 06/17/2023 0525   HGB 7.3 (L) 06/17/2023 0525   HCT 22.3 (L) 06/17/2023 0525   PLT 312 06/17/2023 0525   MCV 86.8 06/17/2023 0525   MCH 28.4 06/17/2023 0525   MCHC 32.7 06/17/2023 0525   RDW 14.5 06/17/2023 0525    BMET    Component Value Date/Time   NA 136 06/15/2023 0435   K 3.8 06/15/2023 0435   CL 104 06/15/2023 0435   CO2 24 06/15/2023 0435   GLUCOSE 124 (H) 06/15/2023 0435   BUN 12 06/15/2023 0435   CREATININE 0.76 06/15/2023 0435   CALCIUM 8.6 (L) 06/15/2023 0435   GFRNONAA >60 06/15/2023 0435   GFRAA >60 10/18/2017 0919    INR    Component Value Date/Time   INR 1.0 06/07/2023 2233     Intake/Output Summary (Last 24 hours) at 06/18/2023 0908 Last data filed at 06/18/2023 0000 Gross per 24 hour  Intake 720 ml  Output 775 ml  Net -55 ml     Assessment/Plan:  72 y.o. male is s/p  Left GSV harvest; left SFA to PT bypass with reversed GSV 4 Days Post op  LLE remains well perfused and warm Incisions intact and well appearing  I did not take left foot dressings down as assisted patient to the bathroom Continue ACE wrap and encourage elevation of left leg when in bed/chair Will get H&H this morning as hgb was trending down VSS Continue to mobilize as tolerated HH PT/ OT with [facility] arranged at d/c Will  continue discussions regarding management of foot wounds with patient. At minimum will need further wound debridement vs. TMA  Graceann Congress, PA-C [facility] [phone] 06/18/2023 9:08 AM  VASCULAR STAFF ADDENDUM: I have independently interviewed and examined the patient. I agree with the above.  Discussed debridement vs TMA and patient would like to continue with local wound care. Spoke with Dr. Lajoyce Corners who will see him in 1 week in the office to re-assess the wound and discuss management.  DC tomorrow pending Hgb stabilization  Daria Pastures MD [facility] Phone Number: [phone] 06/18/2023 10:26 AM

## 2023-06-18 NOTE — Plan of Care (Signed)
  Problem: Activity: Goal: Ability to return to baseline activity level will improve Outcome: Progressing   Problem: Education: Goal: Knowledge of prescribed regimen will improve Outcome: Progressing   Problem: Activity: Goal: Ability to tolerate increased activity will improve Outcome: Progressing   Problem: Clinical Measurements: Goal: Postoperative complications will be avoided or minimized Outcome: Progressing Goal: Signs and symptoms of graft occlusion will improve Outcome: Progressing   Problem: Skin Integrity: Goal: Demonstration of wound healing without infection will improve Outcome: Progressing   Problem: Education: Goal: Knowledge of General Education information will improve Description: Including pain rating scale, medication(s)/side effects and non-pharmacologic comfort measures Outcome: Progressing

## 2023-06-18 NOTE — Progress Notes (Signed)
Mobility Specialist Progress Note:   06/18/23 1400  Mobility  Activity Ambulated with assistance in hallway  Level of Assistance Standby assist, set-up cues, supervision of patient - no hands on  Assistive Device Front wheel walker  Distance Ambulated (ft) 60 ft  LLE Weight Bearing WBAT  Activity Response Tolerated well  Mobility Referral Yes  $Mobility charge 1 Mobility  Mobility Specialist Start Time (ACUTE ONLY) 1356  Mobility Specialist Stop Time (ACUTE ONLY) 1405  Mobility Specialist Time Calculation (min) (ACUTE ONLY) 9 min   Pt received in bed, agreeable to mobility. C/o 8/10 pain in L foot. Asymptomatic throughout session. Pt left in chair with call bell and all needs met.  [place] Earlene Plater Mobility Specialist Please contact via Special educational needs teacher or Rehab office at [phone]

## 2023-06-19 LAB — HEMOGLOBIN AND HEMATOCRIT, BLOOD
HCT: 23.9 % — ABNORMAL LOW (ref 39.0–52.0)
Hemoglobin: 7.8 g/dL — ABNORMAL LOW (ref 13.0–17.0)

## 2023-06-19 NOTE — Discharge Instructions (Signed)
[facility]  Discharge instructions  Lower Extremity Bypass Surgery  Please refer to the following instruction for your post-procedure care. Your surgeon or physician assistant will discuss any changes with you.  Activity  You are encouraged to walk as much as you can. You can slowly return to normal activities during the month after your surgery. Avoid strenuous activity and heavy lifting until your doctor tells you it's OK. Avoid activities such as vacuuming or swinging a golf club. Do not drive until your doctor give the OK and you are no longer taking prescription pain medications. It is also normal to have difficulty with sleep habits, eating and bowel movement after surgery. These will go away with time.  Bathing/Showering  Shower daily after you go home. Do not soak in a bathtub, hot tub, or swim until the incision heals completely.  Incision Care  Clean your incision with mild soap and water. Shower every day. Pat the area dry with a clean towel. You do not need a bandage unless otherwise instructed. Do not apply any ointments or creams to your incision. If you have open wounds you will be instructed how to care for them or a visiting nurse may be arranged for you. If you have staples or sutures along your incision they will be removed at your post-op appointment. You may have skin glue on your incision. Do not peel it off. It will come off on its own in about one week.  Wash the groin wound with soap and water daily and pat dry. (No tub bath-only shower)  Then put a dry gauze or washcloth in the groin to keep this area dry to help prevent wound infection.  Do this daily and as needed.  Do not use Vaseline or neosporin on your incisions.  Only use soap and water on your incisions and then protect and keep dry.  Diet  Resume your normal diet. There are no special food restrictions following this procedure. A low fat/ low cholesterol diet is  recommended for all patients with vascular disease. In order to heal from your surgery, it is CRITICAL to get adequate nutrition. Your body requires vitamins, minerals, and protein. Vegetables are the best source of vitamins and minerals. Vegetables also provide the perfect balance of protein. Processed food has little nutritional value, so try to avoid this.  Medications  Resume taking all your medications unless your doctor or physician assistant tells you not to. If your incision is causing pain, you may take over-the-counter pain relievers such as acetaminophen (Tylenol). If you were prescribed a stronger pain medication, please aware these medication can cause nausea and constipation. Prevent nausea by taking the medication with a snack or meal. Avoid constipation by drinking plenty of fluids and eating foods with high amount of fiber, such as fruits, vegetables, and grains. Take Colace 100 mg (an over-the-counter stool softener) twice a day as needed for constipation.  Do not take Tylenol if you are taking prescription pain medications.  Follow Up  Our office will schedule a follow up appointment 2-3 weeks following discharge.  Please call us immediately for any of the following conditions  Severe or worsening pain in your legs or feet while at rest or while walking Increase pain, redness, warmth, or drainage (pus) from your incision site(s) Fever of 101 degree or higher The swelling in your leg with the bypass suddenly worsens and becomes more painful than when you were in the hospital If you have  been instructed to feel your graft pulse then you should do so every day. If you can no longer feel this pulse, call the office immediately. Not all patients are given this instruction.  Leg swelling is common after leg bypass surgery.  The swelling should improve over a few months following surgery. To improve the swelling, you may elevate your legs above the level of your heart while you are  sitting or resting. Your surgeon or physician assistant may ask you to apply an ACE wrap or wear compression (TED) stockings to help to reduce swelling.  Reduce your risk of vascular disease  Stop smoking. If you would like help call [facility] at 1-800-QUIT-NOW (2607260170) or [place] at [phone].  Manage your cholesterol Maintain a desired weight Control your diabetes weight Control your diabetes Keep your blood pressure down  If you have any questions, please call the office at [phone]

## 2023-06-19 NOTE — Plan of Care (Signed)
  Problem: Education: Goal: Understanding of CV disease, CV risk reduction, and recovery process will improve Outcome: Progressing   Problem: Education: Goal: Knowledge of prescribed regimen will improve Outcome: Progressing   Problem: Education: Goal: Knowledge of General Education information will improve Description: Including pain rating scale, medication(s)/side effects and non-pharmacologic comfort measures Outcome: Progressing   Problem: Pain Managment: Goal: General experience of comfort will improve Outcome: Progressing   Problem: Health Behavior/Discharge Planning: Goal: Ability to safely manage health-related needs after discharge will improve Outcome: Not Progressing   Problem: Activity: Goal: Ability to tolerate increased activity will improve Outcome: Not Progressing

## 2023-06-19 NOTE — Progress Notes (Addendum)
  Progress Note    07-06-23 7:22 AM 5 Days Post-Op  Subjective:  says he is now ready to pursue further surgery on his foot. Said " I don't want to go home with a dead foot".   Vitals:   07/06/23 0008 06-Jul-2023 0628  BP: 133/64 122/60  Pulse: 77   Resp: 16 16  Temp: 98 F (36.7 C) 97.9 F (36.6 C)  SpO2: 97% 99%   Physical Exam: Cardiac:  regular Lungs:  non labored Incisions:  LLE incisions are all intact and well appearing Extremities:  LLE is well perfused and warm with brisk doppler PT signal. Necrotic tissue on 1st, 2nd and 3rd toes  Abdomen:  soft Neurologic: alert and oriented  CBC    Component Value Date/Time   WBC 11.5 (H) 06/17/2023 0525   RBC 2.57 (L) 06/17/2023 0525   HGB 8.0 (L) 06/18/2023 1013   HCT 23.4 (L) 06/18/2023 1013   PLT 312 06/17/2023 0525   MCV 86.8 06/17/2023 0525   MCH 28.4 06/17/2023 0525   MCHC 32.7 06/17/2023 0525   RDW 14.5 06/17/2023 0525    BMET    Component Value Date/Time   NA 136 06/15/2023 0435   K 3.8 06/15/2023 0435   CL 104 06/15/2023 0435   CO2 24 06/15/2023 0435   GLUCOSE 124 (H) 06/15/2023 0435   BUN 12 06/15/2023 0435   CREATININE 0.76 06/15/2023 0435   CALCIUM 8.6 (L) 06/15/2023 0435   GFRNONAA >60 06/15/2023 0435   GFRAA >60 10/18/2017 0919    INR    Component Value Date/Time   INR 1.0 06/07/2023 2233     Intake/Output Summary (Last 24 hours) at 2023-07-06 2841 Last data filed at 07-06-2023 0400 Gross per 24 hour  Intake --  Output 1200 ml  Net -1200 ml     Assessment/Plan:  72 y.o. male is s/p  Left GSV harvest; left SFA to PT bypass with reversed GSV  5 Days Post-Op   LLE remains well perfused and warm with doppler PT signal Incisions intact and well appearing ACE wrap from left foot to knee as needed for swelling Left foot dressings changed this morning Patient now wants to pursue debridement of his L foot wounds  Will plan for this in the OR tomorrow  NPO after midnight Consent order  placed   Dory Horn [facility] [phone] 2023/07/06 7:22 AM  VASCULAR STAFF ADDENDUM: I have independently interviewed and examined the patient. I agree with the above.  Plan for left 2nd toe amp site debridement tomorrow.  Daria Pastures MD [facility] Phone Number: [phone] 2023/07/06 9:25 AM

## 2023-06-20 ENCOUNTER — Other Ambulatory Visit: Payer: Self-pay

## 2023-06-20 ENCOUNTER — Inpatient Hospital Stay (HOSPITAL_COMMUNITY): Payer: Medicare HMO | Admitting: Anesthesiology

## 2023-06-20 ENCOUNTER — Encounter (HOSPITAL_COMMUNITY)
Admission: RE | Disposition: A | Discharge status: Home Health | Payer: Self-pay | Source: Home / Self Care | Attending: Vascular Surgery

## 2023-06-20 ENCOUNTER — Encounter (HOSPITAL_COMMUNITY): Payer: Self-pay | Admitting: Vascular Surgery

## 2023-06-20 DIAGNOSIS — T8781 Dehiscence of amputation stump: Secondary | ICD-10-CM

## 2023-06-20 DIAGNOSIS — L97529 Non-pressure chronic ulcer of other part of left foot with unspecified severity: Secondary | ICD-10-CM | POA: Diagnosis not present

## 2023-06-20 DIAGNOSIS — E11621 Type 2 diabetes mellitus with foot ulcer: Secondary | ICD-10-CM

## 2023-06-20 HISTORY — PX: WOUND DEBRIDEMENT: SHX247

## 2023-06-20 LAB — CBC
HCT: 24.8 % — ABNORMAL LOW (ref 39.0–52.0)
Hemoglobin: 8.2 g/dL — ABNORMAL LOW (ref 13.0–17.0)
MCH: 29 pg (ref 26.0–34.0)
MCHC: 33.1 g/dL (ref 30.0–36.0)
MCV: 87.6 fL (ref 80.0–100.0)
Platelets: 439 10*3/uL — ABNORMAL HIGH (ref 150–400)
RBC: 2.83 MIL/uL — ABNORMAL LOW (ref 4.22–5.81)
RDW: 14.6 % (ref 11.5–15.5)
WBC: 11.1 10*3/uL — ABNORMAL HIGH (ref 4.0–10.5)
nRBC: 0.4 % — ABNORMAL HIGH (ref 0.0–0.2)

## 2023-06-20 LAB — GLUCOSE, CAPILLARY
Glucose-Capillary: 106 mg/dL — ABNORMAL HIGH (ref 70–99)
Glucose-Capillary: 102 mg/dL — ABNORMAL HIGH (ref 70–99)

## 2023-06-20 LAB — TYPE AND SCREEN
ABO/RH(D): B POS
Antibody Screen: NEGATIVE

## 2023-06-20 SURGERY — DEBRIDEMENT, WOUND
Anesthesia: General | Site: Foot | Laterality: Left

## 2023-06-20 MED ORDER — ACETAMINOPHEN 10 MG/ML IV SOLN
INTRAVENOUS | Status: AC
  Filled 2023-06-20: qty 100

## 2023-06-20 MED ORDER — CEFAZOLIN SODIUM 1 G IJ SOLR
INTRAMUSCULAR | Status: AC
  Filled 2023-06-20: qty 20

## 2023-06-20 MED ORDER — FENTANYL CITRATE (PF) 100 MCG/2ML IJ SOLN
INTRAMUSCULAR | Status: AC
  Filled 2023-06-20: qty 2

## 2023-06-20 MED ORDER — ACETAMINOPHEN 160 MG/5ML PO SOLN: 1000.0000 mg | Freq: Once | ORAL | Status: DC | PRN

## 2023-06-20 MED ORDER — FENTANYL CITRATE (PF) 100 MCG/2ML IJ SOLN
25.0000 ug | INTRAMUSCULAR | Status: DC | PRN
  Administered 2023-06-20 (×4): 50 ug via INTRAVENOUS

## 2023-06-20 MED ORDER — PROPOFOL 10 MG/ML IV BOLUS
INTRAVENOUS | Status: DC | PRN
Start: 2023-06-20 — End: 2023-06-20
  Administered 2023-06-20: 110 mg via INTRAVENOUS

## 2023-06-20 MED ORDER — LACTATED RINGERS IV SOLN: INTRAVENOUS | Status: DC

## 2023-06-20 MED ORDER — ONDANSETRON HCL 4 MG/2ML IJ SOLN
INTRAMUSCULAR | Status: AC
  Filled 2023-06-20: qty 2

## 2023-06-20 MED ORDER — ORAL CARE MOUTH RINSE: 15.0000 mL | Freq: Once | OROMUCOSAL | Status: AC

## 2023-06-20 MED ORDER — PROPOFOL 10 MG/ML IV BOLUS
INTRAVENOUS | Status: AC
  Filled 2023-06-20: qty 20

## 2023-06-20 MED ORDER — ONDANSETRON HCL 4 MG/2ML IJ SOLN
INTRAMUSCULAR | Status: DC | PRN
  Administered 2023-06-20: 4 mg via INTRAVENOUS

## 2023-06-20 MED ORDER — OXYCODONE HCL 5 MG/5ML PO SOLN: 5.0000 mg | Freq: Once | ORAL | Status: DC | PRN

## 2023-06-20 MED ORDER — OXYCODONE HCL 5 MG PO TABS: 5.0000 mg | ORAL_TABLET | Freq: Once | ORAL | Status: DC | PRN

## 2023-06-20 MED ORDER — FENTANYL CITRATE (PF) 250 MCG/5ML IJ SOLN
INTRAMUSCULAR | Status: DC | PRN
  Administered 2023-06-20 (×2): 25 ug via INTRAVENOUS
  Administered 2023-06-20 (×2): 50 ug via INTRAVENOUS

## 2023-06-20 MED ORDER — CHLORHEXIDINE GLUCONATE 0.12 % MT SOLN: 15.0000 mL | Freq: Once | OROMUCOSAL | Status: AC

## 2023-06-20 MED ORDER — FENTANYL CITRATE (PF) 250 MCG/5ML IJ SOLN
INTRAMUSCULAR | Status: AC
  Filled 2023-06-20: qty 5

## 2023-06-20 MED ORDER — LIDOCAINE 2% (20 MG/ML) 5 ML SYRINGE
INTRAMUSCULAR | Status: AC
  Filled 2023-06-20: qty 5

## 2023-06-20 MED ORDER — ACETAMINOPHEN 10 MG/ML IV SOLN
1000.0000 mg | Freq: Once | INTRAVENOUS | Status: DC | PRN
  Administered 2023-06-20: 1000 mg via INTRAVENOUS

## 2023-06-20 MED ORDER — CEFAZOLIN SODIUM-DEXTROSE 2-3 GM-%(50ML) IV SOLR
INTRAVENOUS | Status: DC | PRN
Start: 2023-06-20 — End: 2023-06-20
  Administered 2023-06-20: 2 g via INTRAVENOUS

## 2023-06-20 MED ORDER — CHLORHEXIDINE GLUCONATE 0.12 % MT SOLN
OROMUCOSAL | Status: AC
  Administered 2023-06-20: 15 mL via OROMUCOSAL
  Filled 2023-06-20: qty 15

## 2023-06-20 MED ORDER — LIDOCAINE HCL (CARDIAC) PF 100 MG/5ML IV SOSY
PREFILLED_SYRINGE | INTRAVENOUS | Status: DC | PRN
Start: 2023-06-20 — End: 2023-06-20
  Administered 2023-06-20: 60 mg via INTRATRACHEAL

## 2023-06-20 MED ORDER — DEXAMETHASONE SODIUM PHOSPHATE 10 MG/ML IJ SOLN
INTRAMUSCULAR | Status: DC | PRN
  Administered 2023-06-20: 10 mg via INTRAVENOUS

## 2023-06-20 MED ORDER — 0.9 % SODIUM CHLORIDE (POUR BTL) OPTIME
TOPICAL | Status: DC | PRN
  Administered 2023-06-20: 1000 mL

## 2023-06-20 MED ORDER — ACETAMINOPHEN 500 MG PO TABS: 1000.0000 mg | ORAL_TABLET | Freq: Once | ORAL | Status: DC | PRN

## 2023-06-20 SURGICAL SUPPLY — 28 items
BAG COUNTER SPONGE SURGICOUNT (BAG) ×1 IMPLANT
BAG SPNG CNTER NS LX DISP (BAG) ×1
BNDG ADH 5X4 AIR PERM ELC (GAUZE/BANDAGES/DRESSINGS) ×1
BNDG COHESIVE 4X5 WHT NS (GAUZE/BANDAGES/DRESSINGS) IMPLANT
BNDG GAUZE DERMACEA FLUFF 4 (GAUZE/BANDAGES/DRESSINGS) IMPLANT
BNDG GZE DERMACEA 4 6PLY (GAUZE/BANDAGES/DRESSINGS) ×1
CANISTER SUCT 3000ML PPV (MISCELLANEOUS) ×1 IMPLANT
COVER SURGICAL LIGHT HANDLE (MISCELLANEOUS) ×1 IMPLANT
DRAPE HALF SHEET 40X57 (DRAPES) IMPLANT
DRAPE U-SHAPE 76X120 STRL (DRAPES) IMPLANT
ELECT REM PT RETURN 9FT ADLT (ELECTROSURGICAL) ×1
ELECTRODE REM PT RTRN 9FT ADLT (ELECTROSURGICAL) ×1 IMPLANT
GAUZE SPONGE 4X4 12PLY STRL (GAUZE/BANDAGES/DRESSINGS) ×1 IMPLANT
GAUZE XEROFORM 5X9 LF (GAUZE/BANDAGES/DRESSINGS) IMPLANT
GLOVE BIOGEL PI IND STRL 7.0 (GLOVE) ×1 IMPLANT
GOWN STRL REUS W/ TWL LRG LVL3 (GOWN DISPOSABLE) ×2 IMPLANT
GOWN STRL REUS W/ TWL XL LVL3 (GOWN DISPOSABLE) ×1 IMPLANT
GOWN STRL REUS W/TWL LRG LVL3 (GOWN DISPOSABLE) ×2
GOWN STRL REUS W/TWL XL LVL3 (GOWN DISPOSABLE) ×1
IV NS IRRIG 3000ML ARTHROMATIC (IV SOLUTION) ×1 IMPLANT
KIT BASIN OR (CUSTOM PROCEDURE TRAY) ×1 IMPLANT
KIT TURNOVER KIT B (KITS) ×1 IMPLANT
NS IRRIG 1000ML POUR BTL (IV SOLUTION) ×1 IMPLANT
PACK GENERAL/GYN (CUSTOM PROCEDURE TRAY) ×1 IMPLANT
PACK UNIVERSAL I (CUSTOM PROCEDURE TRAY) ×1 IMPLANT
PAD ARMBOARD 7.5X6 YLW CONV (MISCELLANEOUS) ×2 IMPLANT
TOWEL GREEN STERILE (TOWEL DISPOSABLE) ×1 IMPLANT
WATER STERILE IRR 1000ML POUR (IV SOLUTION) ×1 IMPLANT

## 2023-06-20 NOTE — Anesthesia Preprocedure Evaluation (Signed)
Anesthesia Evaluation  Patient identified by MRN, date of birth, ID band Patient awake    Reviewed: Allergy & Precautions, NPO status , Patient's Chart, lab work & pertinent test results  History of Anesthesia Complications Negative for: history of anesthetic complications  Airway Mallampati: I  TM Distance: >3 FB Neck ROM: Full    Dental  (+) Edentulous Upper, Missing, Dental Advisory Given,    Pulmonary neg shortness of breath, neg COPD, neg recent URI, Patient abstained from smoking., former smoker   breath sounds clear to auscultation       Cardiovascular hypertension, Pt. on medications (-) angina + Peripheral Vascular Disease  (-) Past MI and (-) CHF  Rhythm:Regular     Neuro/Psych  Headaches, neg Seizures    GI/Hepatic Neg liver ROS,GERD  Medicated and Controlled,,  Endo/Other  diabetes  Lab Results      Component                Value               Date                      HGBA1C                   6.7 (H)             06/07/2023             Renal/GU negative Renal ROSLab Results      Component                Value               Date                      NA                       136                 06/15/2023                K                        3.8                 06/15/2023                CO2                      24                  06/15/2023                GLUCOSE                  124 (H)             06/15/2023                BUN                      12                  06/15/2023                CREATININE  0.76                06/15/2023                CALCIUM                  8.6 (L)             06/15/2023                GFRNONAA                 >60                 06/15/2023                Musculoskeletal  (+) Arthritis ,    Abdominal   Peds  Hematology  (+) Blood dyscrasia, anemia Lab Results      Component                Value               Date                      WBC                       11.1 (H)            06/20/2023                HGB                      8.2 (L)             06/20/2023                HCT                      24.8 (L)            06/20/2023                MCV                      87.6                06/20/2023                PLT                      439 (H)             06/20/2023             plavix   Anesthesia Other Findings   Reproductive/Obstetrics                             Anesthesia Physical Anesthesia Plan  ASA: 3  Anesthesia Plan: General   Post-op Pain Management: Tylenol PO (pre-op)*   Induction: Intravenous  PONV Risk Score and Plan: 2 and Ondansetron and Dexamethasone  Airway Management Planned: Oral ETT and LMA  Additional Equipment: None  Intra-op Plan:   Post-operative Plan: Extubation in OR  Informed Consent: I have reviewed the patients History and Physical, chart, labs and discussed the procedure including the risks, benefits and alternatives for the proposed anesthesia with the patient or authorized representative who has indicated his/her understanding and acceptance.  Dental advisory given  Plan Discussed with: CRNA  Anesthesia Plan Comments:        Anesthesia Quick Evaluation

## 2023-06-20 NOTE — Progress Notes (Addendum)
  Progress Note    06/20/2023 7:54 AM 6 Days Post-Op  Scheduled for left foot wound debridement in the OR with Dr. Hetty Blend Patient and his wife's questions regarding surgery were answered Keep NPO Consent order placed   Dory Horn [facility] [phone] 06/20/2023 7:54 AM  VASCULAR STAFF ADDENDUM: I have independently interviewed and examined the patient. I agree with the above.   Daria Pastures MD [facility] Phone Number: [phone] 06/20/2023 12:44 PM

## 2023-06-20 NOTE — Op Note (Addendum)
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 male who recently underwent a SFA to PT artery bypass for chronic limb threatening ischemia with a nonhealing left second toe amputation.  Despite being maximally revascularized the wound continued to progress and necrotic wound edges with fibrinous buildup.  His first and third toes are also concerning for soft tissue necrosis but he is not ready to accept a transmetatarsal amputation at this time.  I offered debridement of the left second toe wound and he was agreeable.  FINDINGS:   Necrotic tissue in wound bed.  Marginal but bleeding tissue deeper in the wound site.   TECHNIQUE:   The patient was brought to the operating room positioned supine on the operating room table.  General anesthesia was induced and an LMA was placed.  Preoperative antibiotics in the form of Ancef were administered.  The left foot was prepped and draped in the usual sterile fashion and a timeout was performed. The edges of the wound were sharply debrided and all necrotic fat and muscle were removed from the wound bed.  There was some healthy bleeding tissue deeper in the wound base although appeared marginal at best especially at the medial and lateral edges encroaching on the first and third metatarsals.  The wound was then copiously irrigated and hemostasis was achieved with electrocautery.  The wound was dressed with a wet-to-dry and an Ace bandage.  The patient tolerated procedure well was brought to PACU in stable condition.  Daria Pastures, MD [facility] DATE OF  DICTATION:   06/20/2023

## 2023-06-20 NOTE — Transfer of Care (Signed)
Immediate Anesthesia Transfer of Care Note  Patient: [name] [name]  Procedure(s) Performed: DEBRIDEMENT WOUND (Left: Foot)  Patient Location: PACU  Anesthesia Type:General  Level of Consciousness: awake, alert , oriented, patient cooperative, and responds to stimulation  Airway & Oxygen Therapy: Patient Spontanous Breathing and Patient connected to face mask oxygen  Post-op Assessment: Report given to RN and Post -op Vital signs reviewed and stable  Post vital signs: Reviewed and stable  Last Vitals:  Vitals Value Taken Time  BP 117/87 06/20/23 1330  Temp 36.9 C 06/20/23 1330  Pulse 92 06/20/23 1343  Resp 10 06/20/23 1343  SpO2 100 % 06/20/23 1343  Vitals shown include unfiled device data.    Complications: No notable events documented.

## 2023-06-20 NOTE — Progress Notes (Signed)
PT Cancellation Note  Patient Details Name: [name] [name] MRN: [id number] DOB: October 30, 1950   Cancelled Treatment:    Reason Eval/Treat Not Completed: (P) Patient at procedure or test/unavailable (Pt off floor in OR for further debridement. Will follow up as able.)   [name] 06/20/2023, 11:26 AM

## 2023-06-20 NOTE — Plan of Care (Signed)
  Problem: Education: Goal: Understanding of CV disease, CV risk reduction, and recovery process will improve Outcome: Progressing   Problem: Activity: Goal: Ability to return to baseline activity level will improve Outcome: Progressing   Problem: Cardiovascular: Goal: Ability to achieve and maintain adequate cardiovascular perfusion will improve Outcome: Progressing Goal: Vascular access site(s) Level 0-1 will be maintained Outcome: Progressing   Problem: Health Behavior/Discharge Planning: Goal: Ability to safely manage health-related needs after discharge will improve Outcome: Progressing   Problem: Education: Goal: Knowledge of prescribed regimen will improve Outcome: Progressing   Problem: Activity: Goal: Ability to tolerate increased activity will improve Outcome: Progressing   Problem: Bowel/Gastric: Goal: Gastrointestinal status for postoperative course will improve Outcome: Progressing   Problem: Clinical Measurements: Goal: Postoperative complications will be avoided or minimized Outcome: Progressing Goal: Signs and symptoms of graft occlusion will improve Outcome: Progressing   Problem: Skin Integrity: Goal: Demonstration of wound healing without infection will improve Outcome: Progressing   Problem: Education: Goal: Knowledge of General Education information will improve Description: Including pain rating scale, medication(s)/side effects and non-pharmacologic comfort measures Outcome: Progressing   Problem: Pain Managment: Goal: General experience of comfort will improve Outcome: Progressing   Problem: Safety: Goal: Ability to remain free from injury will improve Outcome: Progressing

## 2023-06-20 NOTE — Progress Notes (Signed)
OT Cancellation Note  Patient Details Name: JAKAREE PICKARD MRN: [id number] DOB: 04/11/1951   Cancelled Treatment:    Reason Eval/Treat Not Completed: Patient at procedure or test/ unavailable (in OR for further 2nd toe amp site debridement)  Evern Bio Mathews Stuhr 06/20/2023, 11:09 AM  Nyoka Cowden OTR/L Acute Rehabilitation Services Office: [phone]

## 2023-06-20 NOTE — Plan of Care (Signed)
  Problem: Education: Goal: Understanding of CV disease, CV risk reduction, and recovery process will improve 06/20/2023 1620 by Darryl Nestle, RN Outcome: Progressing 06/20/2023 1526 by Darryl Nestle, RN Outcome: Progressing   Problem: Activity: Goal: Ability to return to baseline activity level will improve 06/20/2023 1620 by Darryl Nestle, RN Outcome: Progressing 06/20/2023 1526 by Darryl Nestle, RN Outcome: Progressing   Problem: Cardiovascular: Goal: Ability to achieve and maintain adequate cardiovascular perfusion will improve 06/20/2023 1620 by Darryl Nestle, RN Outcome: Progressing 06/20/2023 1526 by Darryl Nestle, RN Outcome: Progressing Goal: Vascular access site(s) Level 0-1 will be maintained 06/20/2023 1620 by Darryl Nestle, RN Outcome: Progressing 06/20/2023 1526 by Darryl Nestle, RN Outcome: Progressing   Problem: Cardiovascular: Goal: Vascular access site(s) Level 0-1 will be maintained 06/20/2023 1620 by Darryl Nestle, RN Outcome: Progressing 06/20/2023 1526 by Darryl Nestle, RN Outcome: Progressing   Problem: Health Behavior/Discharge Planning: Goal: Ability to safely manage health-related needs after discharge will improve 06/20/2023 1620 by Darryl Nestle, RN Outcome: Progressing 06/20/2023 1526 by Darryl Nestle, RN Outcome: Progressing   Problem: Education: Goal: Knowledge of prescribed regimen will improve 06/20/2023 1620 by Darryl Nestle, RN Outcome: Progressing 06/20/2023 1526 by Darryl Nestle, RN Outcome: Progressing   Problem: Activity: Goal: Ability to tolerate increased activity will improve 06/20/2023 1620 by Darryl Nestle, RN Outcome: Progressing 06/20/2023 1526 by Darryl Nestle, RN Outcome: Progressing   Problem: Bowel/Gastric: Goal: Gastrointestinal status for postoperative course will improve 06/20/2023 1620 by Darryl Nestle, RN Outcome:  Progressing 06/20/2023 1526 by Darryl Nestle, RN Outcome: Progressing   Problem: Clinical Measurements: Goal: Postoperative complications will be avoided or minimized 06/20/2023 1620 by Darryl Nestle, RN Outcome: Progressing 06/20/2023 1526 by Darryl Nestle, RN Outcome: Progressing Goal: Signs and symptoms of graft occlusion will improve 06/20/2023 1620 by Darryl Nestle, RN Outcome: Progressing 06/20/2023 1526 by Darryl Nestle, RN Outcome: Progressing   Problem: Skin Integrity: Goal: Demonstration of wound healing without infection will improve 06/20/2023 1620 by Darryl Nestle, RN Outcome: Progressing 06/20/2023 1526 by Darryl Nestle, RN Outcome: Progressing   Problem: Education: Goal: Knowledge of General Education information will improve Description: Including pain rating scale, medication(s)/side effects and non-pharmacologic comfort measures 06/20/2023 1620 by Darryl Nestle, RN Outcome: Progressing 06/20/2023 1526 by Darryl Nestle, RN Outcome: Progressing   Problem: Pain Managment: Goal: General experience of comfort will improve 06/20/2023 1620 by Darryl Nestle, RN Outcome: Progressing 06/20/2023 1526 by Darryl Nestle, RN Outcome: Progressing   Problem: Safety: Goal: Ability to remain free from injury will improve 06/20/2023 1620 by Darryl Nestle, RN Outcome: Progressing 06/20/2023 1526 by Darryl Nestle, RN Outcome: Progressing

## 2023-06-20 NOTE — Progress Notes (Addendum)
CCC Pre-op Review  Pre-op checklist: Completed  NPO: Since midnight  Labs: Hbg 8.2  Consent: Completed per Suzette Battiest, RN bedside nurse  H&P: Karin Lieu  Vitals: WNL  O2 requirements: RA  MAR/PTA review: Completed  IV: 18g RH  Floor nurse name:  Rhetta Mura, RN  Additional info:  Hgb 7.8 10/13 Tele Heparin given at 0501

## 2023-06-20 NOTE — Anesthesia Procedure Notes (Signed)
Procedure Name: LMA Insertion Date/Time: 06/20/2023 12:57 PM  Performed by: Earlene Plater, CRNAPre-anesthesia Checklist: Patient identified, Emergency Drugs available, Suction available, Patient being monitored and Timeout performed Patient Re-evaluated:Patient Re-evaluated prior to induction Oxygen Delivery Method: Circle system utilized Preoxygenation: Pre-oxygenation with 100% oxygen Induction Type: IV induction Ventilation: Mask ventilation without difficulty LMA: LMA inserted LMA Size: 5.0 Number of attempts: 1 Placement Confirmation: positive ETCO2 and breath sounds checked- equal and bilateral Tube secured with: Tape (Confirmed by Dr. Maple Hudson) Dental Injury: Teeth and Oropharynx as per pre-operative assessment

## 2023-06-21 ENCOUNTER — Encounter (HOSPITAL_COMMUNITY): Payer: Self-pay | Admitting: Vascular Surgery

## 2023-06-21 LAB — CBC
HCT: 22.3 % — ABNORMAL LOW (ref 39.0–52.0)
Hemoglobin: 7.3 g/dL — ABNORMAL LOW (ref 13.0–17.0)
MCH: 29.2 pg (ref 26.0–34.0)
MCHC: 32.7 g/dL (ref 30.0–36.0)
MCV: 89.2 fL (ref 80.0–100.0)
Platelets: 437 10*3/uL — ABNORMAL HIGH (ref 150–400)
RBC: 2.5 MIL/uL — ABNORMAL LOW (ref 4.22–5.81)
RDW: 14.9 % (ref 11.5–15.5)
WBC: 13.9 10*3/uL — ABNORMAL HIGH (ref 4.0–10.5)
nRBC: 0.2 % (ref 0.0–0.2)

## 2023-06-21 LAB — GLUCOSE, CAPILLARY
Glucose-Capillary: 190 mg/dL — ABNORMAL HIGH (ref 70–99)
Glucose-Capillary: 197 mg/dL — ABNORMAL HIGH (ref 70–99)

## 2023-06-21 MED ORDER — HYDROCODONE-ACETAMINOPHEN 5-325 MG PO TABS: 1.0000 | ORAL_TABLET | ORAL | 0 refills | Status: DC | PRN

## 2023-06-21 MED ORDER — HYDROCODONE-ACETAMINOPHEN 5-325 MG PO TABS: 1.0000 | ORAL_TABLET | Freq: Four times a day (QID) | ORAL | 0 refills | Status: DC | PRN

## 2023-06-21 NOTE — Progress Notes (Signed)
Discharge instructions reviewed with pt and his wife, both verbalized understanding of instructions and able to teach back.  Copy of instructions given to pt. Pt informed his script was sent to his pharmacy for pick up.  Pt to be d/c'd via wheelchair with belongings, with his wife.         To be escorted by hospital volunteer.   Annice Needy, RN SWOT

## 2023-06-21 NOTE — Progress Notes (Signed)
Physical Therapy Treatment Patient Details Name: [name] [name] MRN: [id number] DOB: [birth year] Today's Date: 06/21/2023   History of Present Illness Pt is a 72 y.o. male admitted 10/8 for left leg critical limb ischemia. He underwent LLE bypass 10/8. 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 PMH: OA, h/o back sx (2019), h/o neck sx (2001)    PT Comments  Pt received sitting EOB and agreeable to session. Pt attempting to put pants on and requires assist donning L ortho shoe. Pt able to finish donning pants in standing with CGA for safety and one LOB back to EOB requiring a second stand. Pt able to tolerate short gait distance, however remains limited by fatigue.  Pt demonstrates difficulty with L dorsiflexion, however pt places foot out in front and is given cues for increased BUE support to maintain heel WB. Pt continues to benefit from PT services to progress toward functional mobility goals.    If plan is discharge home, recommend the following: A little help with walking and/or transfers;A little help with bathing/dressing/bathroom   Can travel by private vehicle        Equipment Recommendations  None recommended by PT    Recommendations for Other Services       Precautions / Restrictions Precautions Precautions: Fall Required Braces or Orthoses: Other Brace Other Brace: L orthopedic shoe Restrictions Weight Bearing Restrictions: Yes Other Position/Activity Restrictions: through heel in orthopedic shoe     Mobility  Bed Mobility               General bed mobility comments: Pt recieved sitting EOB    Transfers Overall transfer level: Needs assistance Equipment used: Rolling walker (2 wheels) Transfers: Sit to/from Stand Sit to Stand: Contact guard assist           General transfer comment: From low EOB with cues for hand and L foot placement    Ambulation/Gait Ambulation/Gait assistance: Contact guard assist Gait Distance (Feet): 40 Feet Assistive device:  Rolling walker (2 wheels) Gait Pattern/deviations: Step-to pattern, Antalgic, Decreased weight shift to left, Decreased stride length, Decreased dorsiflexion - left Gait velocity: decreased     General Gait Details: Pt demonstrating difficulty dorsiflexing L ankle during ambulation  Cues for increased BUE support to offload LLE and upright posture       Balance Overall balance assessment: Needs assistance Sitting-balance support: No upper extremity supported, Feet supported Sitting balance-Leahy Scale: Good Sitting balance - Comments: sitting EOB   Standing balance support: Bilateral upper extremity supported, During functional activity, Reliant on assistive device for balance Standing balance-Leahy Scale: Poor Standing balance comment: with RW support                            Cognition Arousal: Alert Behavior During Therapy: WFL for tasks assessed/performed Overall Cognitive Status: Within Functional Limits for tasks assessed                                          Exercises      General Comments        Pertinent Vitals/Pain Pain Assessment Pain Assessment: Faces Faces Pain Scale: Hurts little more Pain Location: L foot Pain Descriptors / Indicators: Discomfort, Shooting, Grimacing, Aching Pain Intervention(s): Limited activity within patient's tolerance, Monitored during session, Repositioned     PT Goals (current goals can now be found in the  care plan section) Acute Rehab PT Goals Patient Stated Goal: home PT Goal Formulation: With patient Time For Goal Achievement: 06/29/23 Progress towards PT goals: Progressing toward goals    Frequency    Min 1X/week       AM-PAC PT "6 Clicks" Mobility   Outcome Measure  Help needed turning from your back to your side while in a flat bed without using bedrails?: None Help needed moving from lying on your back to sitting on the side of a flat bed without using bedrails?: None Help  needed moving to and from a bed to a chair (including a wheelchair)?: A Little Help needed standing up from a chair using your arms (e.g., wheelchair or bedside chair)?: A Little Help needed to walk in hospital room?: A Little Help needed climbing 3-5 steps with a railing? : A Lot 6 Click Score: 19    End of Session Equipment Utilized During Treatment: Gait belt;Other (comment) (L ortho shoe) Activity Tolerance: Patient tolerated treatment well;Patient limited by fatigue Patient left: in chair;with call bell/phone within reach;with family/visitor present Nurse Communication: Mobility status PT Visit Diagnosis: Difficulty in walking, not elsewhere classified (R26.2);Pain     Time: 1610-9604 PT Time Calculation (min) (ACUTE ONLY): 10 min  Charges:    $Gait Training: 8-22 mins PT General Charges $$ ACUTE PT VISIT: 1 Visit                    [name] [name], PTA Acute Rehabilitation Services Secure Chat Preferred  [phone]    [name] [name] 06/21/2023, 12:59 PM

## 2023-06-21 NOTE — Progress Notes (Signed)
Occupational Therapy Treatment Patient Details Name: [name] [name] MRN: [id number] DOB: [birth year] Today's Date: 06/21/2023   History of present illness Pt is a 72 y.o. male admitted 10/8 for left leg critical limb ischemia. He underwent LLE bypass 10/8. 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 PMH: OA, h/o back sx (2019), h/o neck sx (2001)   OT comments  Pt sitting on EOB with wife visiting in room upon therapy arrival. Pt reports that he is discharging home this afternoon although agreeable to participate in OT treatment session. Assessed BUE ROM and strength with RUE significantly limited. Pt reports no known injury besides many years of playing sports in his past. No pain verbalized during passive shoulder ROM or crepitation noted. Pt demonstrates severe shoulder weakness. Recommended following up with neurology to assess cervical region for possible cause. Pt educated on BUE HEP including table slides and AA/ROM to work on maintaining available joint mobility. VC and visual demonstration provided for form and technique. Pt was able to demonstrate understanding. Exercise modifications were also provided if needed for ROM limitations. D/C recommendation for HHOT is still appropriate.        If plan is discharge home, recommend the following:  A little help with walking and/or transfers;A little help with bathing/dressing/bathroom;Assistance with cooking/housework;Assist for transportation;Help with stairs or ramp for entrance   Equipment Recommendations  Tub/shower seat;BSC/3in1       Precautions / Restrictions Precautions Precautions: Fall Required Braces or Orthoses: Other Brace Other Brace: L orthopedic shoe on when out of bed Restrictions Weight Bearing Restrictions: Yes LLE Weight Bearing: Weight bearing as tolerated Other Position/Activity Restrictions: WBAT only through heel in orthpedic shoe       Mobility   Transfers Overall transfer level: Needs assistance Equipment used:  None Transfers: Bed to chair/wheelchair/BSC     Squat pivot transfers: Supervision       General transfer comment: Pt able to complete squat pivor transfer to/from bed and standard chair during session.     Balance Overall balance assessment: No apparent balance deficits (not formally assessed)   Sitting balance-Leahy Scale: Good Sitting balance - Comments: sitting EOB        ADL either performed or assessed with clinical judgement    Extremity/Trunk Assessment Upper Extremity Assessment Upper Extremity Assessment: RUE deficits/detail RUE Deficits / Details: Pt reports gradually decrease in shoulder A/ROM. Able to tolerate full passive ROM for shoulder flexion and abduction. No crepitation or pain noted in shoulder during passive ROM assessment.  3-/5 shoulder flexion, 1/5 shoulder abduction, 0/5 shoulder adduction, elbow flexion/extension: 5/5 LUE Deficits / Details: A/ROM is [facility] although limited. Shoulder flexion: 3+/5, shoulder abduction: 3+/5, elbow flexion/extension: 5/5.                      Cognition Arousal: Alert Behavior During Therapy: WFL for tasks assessed/performed Overall Cognitive Status: Within Functional Limits for tasks assessed            Exercises General Exercises - Upper Extremity Shoulder Flexion: AAROM, Both, 5 reps, Seated, Other (comment) (used dressing stick to assist. VC for form and technique and modification d/t to limited strength.) Shoulder Horizontal ABduction: AAROM, Both, 5 reps, Seated, Other (comment) (used dressing stick to assist. VC for form and technique and modification d/t to limited strength.) Shoulder Horizontal ADduction: AAROM, Both, 5 reps, Seated, Other (comment) (used dressing stick to assist. VC for form and technique and modification d/t to limited strength.) Other Exercises Other Exercises: seated, table slides, BUE, shoulder  flexion, shoulder abduction, 1 set, 10X       General Comments Wife present during  session.    Pertinent Vitals/ Pain       Pain Assessment Pain Assessment: No/denies pain         Frequency  Min 1X/week        Progress Toward Goals  OT Goals(current goals can now be found in the care plan section)  Progress towards OT goals: Progressing toward goals  Acute Rehab OT Goals Patient Stated Goal: go home today         AM-PAC OT "6 Clicks" Daily Activity     Outcome Measure   Help from another person eating meals?: None Help from another person taking care of personal grooming?: A Little Help from another person toileting, which includes using toliet, bedpan, or urinal?: A Little Help from another person bathing (including washing, rinsing, drying)?: A Little Help from another person to put on and taking off regular upper body clothing?: A Little Help from another person to put on and taking off regular lower body clothing?: A Little 6 Click Score: 19    End of Session    OT Visit Diagnosis: Other abnormalities of gait and mobility (R26.89);Unsteadiness on feet (R26.81);Muscle weakness (generalized) (M62.81)   Activity Tolerance Patient tolerated treatment well   Patient Left in bed;with call bell/phone within reach;with family/visitor present   Nurse Communication          Time: 9562-1308 OT Time Calculation (min): 30 min  Charges: OT General Charges $OT Visit: 1 Visit OT Treatments $Therapeutic Exercise: 23-37 mins  Limmie Patricia, OTR/L,CBIS  Supplemental OT - [facility] and [facility] Secure Chat Preferred    Mikko Lewellen, Charisse March 06/21/2023, 3:48 PM

## 2023-06-21 NOTE — TOC Transition Note (Signed)
Transition of Care (TOC) - CM/SW Discharge Note Donn Pierini RN, BSN Transitions of Care Unit 4E- RN Case Manager See Treatment Team for direct phone #   Patient Details  Name: [name] [name] MRN: [id number] Date of Birth: 1951/07/11  Transition of Care [facility]) CM/SW Contact:  Darrold Span, RN Phone Number: 06/21/2023, 1:32 PM   Clinical Narrative:    Noted d/c order placed. HH orders have been placed for resumption of HH services with [facility].   CM has notified [facility] liaison and [facility] will follow up to resume [facility] services on return home.   Family to transport home, Pt has needed DME.   No further TOC needs noted.    Final next level of care: Home w Home Health Services Barriers to Discharge: Barriers Resolved   Patient Goals and CMS Choice CMS Medicare.gov Compare Post Acute Care list provided to:: Patient Choice offered to / list presented to : Patient  Discharge Placement                 Home w/ [facility]        Discharge Plan and Services Additional resources added to the After Visit Summary for     Discharge Planning Services: CM Consult Post Acute Care Choice: Durable Medical Equipment, Home Health, Resumption of Svcs/PTA Provider          DME Arranged: N/A DME Agency: NA       HH Arranged: RN, PT, OT HH Agency: [facility] Date HH Agency Contacted: 06/16/23 Time HH Agency Contacted: 1600 Representative spoke with at [facility] Agency: Angie  Social Determinants of Health (SDOH) Interventions SDOH Screenings   Food Insecurity: No Food Insecurity (06/08/2023)  Housing: Low Risk  (06/08/2023)  Transportation Needs: No Transportation Needs (06/08/2023)  Utilities: Not At Risk (06/08/2023)  Financial Resource Strain: Low Risk  (06/02/2023)   Received from [facility]  Tobacco Use: Medium Risk (06/20/2023)  Health Literacy: Medium Risk (06/02/2023)   Received from [facility] Care     Readmission Risk Interventions    06/21/2023     1:32 PM  Readmission Risk Prevention Plan  Transportation Screening Complete  Home Care Screening Complete  Medication Review (RN CM) Complete

## 2023-06-21 NOTE — Progress Notes (Addendum)
  Progress Note    06/21/2023 9:33 AM 1 Day Post-Op  Subjective:  had some foot pain yesterday after surgery but this has improved    Vitals:   06/21/23 0003 06/21/23 0353  BP: 123/71 129/65  Pulse: 99 78  Resp: 14 19  Temp: 98.2 F (36.8 C) 97.8 F (36.6 C)  SpO2: 100% 100%    Physical Exam: General:  resting in bed, A&O x4 Cardiac:  regular Lungs:  nonlabored Incisions:  LLE incisions well appearing Extremities:  brisk L PT doppler signal. L 2nd toe debridement site with healthy appearing tissue, no further necrosis. Moderate bloody drainage    CBC    Component Value Date/Time   WBC 11.1 (H) 06/20/2023 0855   RBC 2.83 (L) 06/20/2023 0855   HGB 8.2 (L) 06/20/2023 0855   HCT 24.8 (L) 06/20/2023 0855   PLT 439 (H) 06/20/2023 0855   MCV 87.6 06/20/2023 0855   MCH 29.0 06/20/2023 0855   MCHC 33.1 06/20/2023 0855   RDW 14.6 06/20/2023 0855    BMET    Component Value Date/Time   NA 136 06/15/2023 0435   K 3.8 06/15/2023 0435   CL 104 06/15/2023 0435   CO2 24 06/15/2023 0435   GLUCOSE 124 (H) 06/15/2023 0435   BUN 12 06/15/2023 0435   CREATININE 0.76 06/15/2023 0435   CALCIUM 8.6 (L) 06/15/2023 0435   GFRNONAA >60 06/15/2023 0435   GFRAA >60 10/18/2017 0919    INR    Component Value Date/Time   INR 1.0 06/07/2023 2233     Intake/Output Summary (Last 24 hours) at 06/21/2023 0933 Last data filed at 06/20/2023 2200 Gross per 24 hour  Intake 331 ml  Output 655 ml  Net -324 ml      Assessment/Plan:  72 y.o. male is 1 day post op, s/p: debridement of left 2nd toe amputation site  -Doing well this morning. Had some moderate bleeding from the wound bed overnight but this was controlled with leg elevation and repeat bandaging -L 2nd toe debridement site with decent appearing tissue, no frank necrosis or signs of infection. Moderate bleeding from wound bed -LLE well perfused with brisk PT doppler signal -Amputation site re-bandaged with bulky WTD  dressing and ace bandage -Ambulating well with Darco shoe and walker   Loel Dubonnet, PA-C [facility] [phone] 06/21/2023 9:33 AM   VASCULAR STAFF ADDENDUM: I have independently interviewed and examined the patient. I agree with the above.  Multiphasic PT and DP. Well perfused viable tissue in L 2nd toe amp site. 1st toe tissue marginal. Marginal 3rd-5th forefoot tissue at base of 3rd-5th toes Plan for discharge and follow up with Dr. Lajoyce Corners next week to assess healing Follow up in vascular office in 1 month with ABI and LLE duplex  Daria Pastures MD [facility] Phone Number: [phone] 06/21/2023 10:15 AM

## 2023-06-21 NOTE — Care Management Important Message (Signed)
Important Message  Patient Details  Name: REEVE TURNLEY MRN: [id number] Date of Birth: 22-Aug-1951   Important Message Given:  Yes - Medicare IM     Sherilyn Banker 06/21/2023, 3:28 PM

## 2023-06-21 NOTE — Patient Instructions (Signed)
1) SHOULDER: Flexion On Table   Place hands on towel placed on table, elbows straight. Lean forward with you upper body, pushing towel away from body.  ___ reps per set, ___ sets per day  2) Abduction (Passive)   With arm out to side, resting on towel placed on table with palm DOWN, keeping trunk away from table, lean to the side while pushing towel away from body.  Repeat ____ times. Do ____ sessions per day.  Perform each exercise ____10-15____ reps. 2-3x days.   1) Protraction   Start by holding a wand or cane at chest height.  Next, slowly push the wand outwards in front of your body so that your elbows become fully straightened. Then, return to the original position.     2) Shoulder FLEXION   In the standing position, hold a wand/cane with both arms, palms down on both sides. Raise up the wand/cane allowing your unaffected arm to perform most of the effort. Your affected arm should be partially relaxed.               5) Horizontal Abduction/Adduction      Straight arms holding cane at shoulder height, bring cane to right, center, left. Repeat starting to left.   Copyright  VHI. All rights reserved.

## 2023-06-21 NOTE — Plan of Care (Signed)
  Problem: Education: Goal: Understanding of CV disease, CV risk reduction, and recovery process will improve Outcome: Progressing   Problem: Activity: Goal: Ability to return to baseline activity level will improve Outcome: Progressing   Problem: Cardiovascular: Goal: Ability to achieve and maintain adequate cardiovascular perfusion will improve Outcome: Progressing Goal: Vascular access site(s) Level 0-1 will be maintained Outcome: Progressing   Problem: Health Behavior/Discharge Planning: Goal: Ability to safely manage health-related needs after discharge will improve Outcome: Progressing   Problem: Education: Goal: Knowledge of prescribed regimen will improve Outcome: Progressing   Problem: Activity: Goal: Ability to tolerate increased activity will improve Outcome: Progressing   Problem: Bowel/Gastric: Goal: Gastrointestinal status for postoperative course will improve Outcome: Progressing   Problem: Clinical Measurements: Goal: Postoperative complications will be avoided or minimized Outcome: Progressing Goal: Signs and symptoms of graft occlusion will improve Outcome: Progressing   Problem: Skin Integrity: Goal: Demonstration of wound healing without infection will improve Outcome: Progressing   Problem: Education: Goal: Knowledge of General Education information will improve Description: Including pain rating scale, medication(s)/side effects and non-pharmacologic comfort measures Outcome: Progressing   Problem: Pain Managment: Goal: General experience of comfort will improve Outcome: Progressing   Problem: Safety: Goal: Ability to remain free from injury will improve Outcome: Progressing

## 2023-06-22 DIAGNOSIS — I10 Essential (primary) hypertension: Secondary | ICD-10-CM | POA: Diagnosis not present

## 2023-06-22 DIAGNOSIS — Z4781 Encounter for orthopedic aftercare following surgical amputation: Secondary | ICD-10-CM | POA: Diagnosis not present

## 2023-06-22 DIAGNOSIS — I70222 Atherosclerosis of native arteries of extremities with rest pain, left leg: Secondary | ICD-10-CM | POA: Diagnosis not present

## 2023-06-22 DIAGNOSIS — D649 Anemia, unspecified: Secondary | ICD-10-CM | POA: Diagnosis not present

## 2023-06-22 DIAGNOSIS — E876 Hypokalemia: Secondary | ICD-10-CM | POA: Diagnosis not present

## 2023-06-22 DIAGNOSIS — Z7902 Long term (current) use of antithrombotics/antiplatelets: Secondary | ICD-10-CM | POA: Diagnosis not present

## 2023-06-22 DIAGNOSIS — F17210 Nicotine dependence, cigarettes, uncomplicated: Secondary | ICD-10-CM | POA: Diagnosis not present

## 2023-06-22 DIAGNOSIS — Z89422 Acquired absence of other left toe(s): Secondary | ICD-10-CM | POA: Diagnosis not present

## 2023-06-22 DIAGNOSIS — Z7982 Long term (current) use of aspirin: Secondary | ICD-10-CM | POA: Diagnosis not present

## 2023-06-22 DIAGNOSIS — E1151 Type 2 diabetes mellitus with diabetic peripheral angiopathy without gangrene: Secondary | ICD-10-CM | POA: Diagnosis not present

## 2023-06-23 NOTE — Anesthesia Postprocedure Evaluation (Signed)
Anesthesia Post Note  Patient: [name] [name]  Procedure(s) Performed: DEBRIDEMENT WOUND (Left: Foot)     Patient location during evaluation: PACU Anesthesia Type: General Level of consciousness: awake and alert Pain management: pain level controlled Vital Signs Assessment: post-procedure vital signs reviewed and stable Respiratory status: spontaneous breathing, nonlabored ventilation, respiratory function stable and patient connected to nasal cannula oxygen Cardiovascular status: blood pressure returned to baseline and stable Postop Assessment: no apparent nausea or vomiting Anesthetic complications: no   No notable events documented.       Tanique Matney

## 2023-06-24 DIAGNOSIS — E1151 Type 2 diabetes mellitus with diabetic peripheral angiopathy without gangrene: Secondary | ICD-10-CM | POA: Diagnosis not present

## 2023-06-24 DIAGNOSIS — Z7902 Long term (current) use of antithrombotics/antiplatelets: Secondary | ICD-10-CM | POA: Diagnosis not present

## 2023-06-24 DIAGNOSIS — D649 Anemia, unspecified: Secondary | ICD-10-CM | POA: Diagnosis not present

## 2023-06-24 DIAGNOSIS — Z89422 Acquired absence of other left toe(s): Secondary | ICD-10-CM | POA: Diagnosis not present

## 2023-06-24 DIAGNOSIS — Z4781 Encounter for orthopedic aftercare following surgical amputation: Secondary | ICD-10-CM | POA: Diagnosis not present

## 2023-06-24 DIAGNOSIS — E876 Hypokalemia: Secondary | ICD-10-CM | POA: Diagnosis not present

## 2023-06-24 DIAGNOSIS — Z7982 Long term (current) use of aspirin: Secondary | ICD-10-CM | POA: Diagnosis not present

## 2023-06-24 DIAGNOSIS — I10 Essential (primary) hypertension: Secondary | ICD-10-CM | POA: Diagnosis not present

## 2023-06-24 DIAGNOSIS — I70222 Atherosclerosis of native arteries of extremities with rest pain, left leg: Secondary | ICD-10-CM | POA: Diagnosis not present

## 2023-06-24 DIAGNOSIS — F17210 Nicotine dependence, cigarettes, uncomplicated: Secondary | ICD-10-CM | POA: Diagnosis not present

## 2023-06-27 DIAGNOSIS — E1151 Type 2 diabetes mellitus with diabetic peripheral angiopathy without gangrene: Secondary | ICD-10-CM | POA: Diagnosis not present

## 2023-06-27 DIAGNOSIS — I70222 Atherosclerosis of native arteries of extremities with rest pain, left leg: Secondary | ICD-10-CM | POA: Diagnosis not present

## 2023-06-27 DIAGNOSIS — Z7982 Long term (current) use of aspirin: Secondary | ICD-10-CM | POA: Diagnosis not present

## 2023-06-27 DIAGNOSIS — Z89422 Acquired absence of other left toe(s): Secondary | ICD-10-CM | POA: Diagnosis not present

## 2023-06-27 DIAGNOSIS — Z4781 Encounter for orthopedic aftercare following surgical amputation: Secondary | ICD-10-CM | POA: Diagnosis not present

## 2023-06-27 DIAGNOSIS — I10 Essential (primary) hypertension: Secondary | ICD-10-CM | POA: Diagnosis not present

## 2023-06-27 DIAGNOSIS — Z7902 Long term (current) use of antithrombotics/antiplatelets: Secondary | ICD-10-CM | POA: Diagnosis not present

## 2023-06-27 DIAGNOSIS — D649 Anemia, unspecified: Secondary | ICD-10-CM | POA: Diagnosis not present

## 2023-06-27 DIAGNOSIS — E876 Hypokalemia: Secondary | ICD-10-CM | POA: Diagnosis not present

## 2023-06-27 DIAGNOSIS — F17210 Nicotine dependence, cigarettes, uncomplicated: Secondary | ICD-10-CM | POA: Diagnosis not present

## 2023-06-27 NOTE — Discharge Summary (Signed)
Bypass Discharge Summary Patient ID: [name] [name] [id number] 72 y.o. 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 male who recently underwent a SFA to PT artery bypass for chronic limb threatening ischemia with a nonhealing left second toe amputation.  Despite being maximally revascularized the wound continued to progress and necrotic wound edges with fibrinous buildup.  His first and third toes are also concerning for soft tissue necrosis but he is not ready to accept a transmetatarsal amputation at this time.  I offered debridement of the left second toe wound and he was agreeable.   Hospital Course: ***  Consults: orthopedic surgery  Treatments: antibiotics: Ancef and Augmentin and Levofloxacin, analgesia: acetaminophen, Dilaudid, and Fentayl, Oxycodone, Percocet, anticoagulation: LMW heparin, therapies: PT, OT, RN, and SW, and surgery: Left GSV harvest; Left SFA to PT artery bypass with reversed GSV; Sharp debridement of skin and soft tissue    Disposition: Discharge disposition: 01-Home or Self Care       - For [name] [name] Registry use ---  Post-op:  Wound infection: Yes  Graft infection: No  Transfusion: Yes  If yes, 1 units given New Arrhythmia: No Patency judged by: Arly.Keller ] Dopper only, [ ]  Palpable graft pulse, [ ]  Palpable distal pulse, [ ]  ABI inc. > 0.15, [ ]  Duplex D/C Ambulatory Status: Ambulatory with Assistance  Complications: MI: Arly.Keller ] No, [ ]  Troponin only, [ ]  EKG or Clinical CHF:  No Resp failure: Arly.Keller ] none, [ ]  Pneumonia, [ ]  Ventilator Chg in renal function: Arly.Keller ] none, [ ]  Inc. Cr > 0.5, [ ]  Temp. Dialysis, [ ]  Permanent dialysis Stroke: Arly.Keller ] None, [ ]  Minor, [ ]  Major Return to OR: Yes  Reason for return to OR: [ ]  Bleeding, [ ]  Infection, [ ]  Thrombosis, Arly.Keller ] Revision  Discharge medications: Statin use:  Yes ASA use:  Yes Plavix use:  Yes Beta blocker use: No  for medical reason not indicated Coumadin use: No  for medical reason not indicated    Patient Instructions:  Allergies as of 06/21/2023       Reactions   Gadobutrol Hives, Itching, Swelling   After injection of MRI contrast Gadavist pt experienced facial itching, stuffy nose, eye swelling   Iodinated Contrast Media Hives        Medication List     TAKE these medications    amLODipine 5 MG tablet Commonly known as: NORVASC Take 1 tablet (5 mg total) by mouth daily.   amoxicillin-clavulanate 875-125 MG tablet Commonly known as: AUGMENTIN Take 1 tablet by mouth every 12 (twelve) hours.   Ascorbic Acid 1000 MG Tbcr Take 1,000 mg by mouth in the morning and at bedtime.   aspirin 81 MG chewable tablet Chew 1 tablet (81 mg total) by mouth daily.   atorvastatin 40 MG tablet Commonly known as: LIPITOR Take 1 tablet (40 mg total) by mouth daily.   clopidogrel 75 MG tablet Commonly known as: PLAVIX Take 1 tablet (75 mg total) by mouth daily with breakfast.   cyanocobalamin 1000 MCG tablet  Commonly known as: VITAMIN B12 Take 1,000 mcg by mouth daily.   HYDROcodone-acetaminophen 5-325 MG tablet Commonly known as: NORCO/VICODIN Take 1 tablet by mouth every 4 (four) hours as needed for severe pain (pain score 7-10). What changed:  when to take this reasons to take this   levofloxacin 500 MG tablet Commonly known as: LEVAQUIN Take 1 tablet (500 mg total) by mouth daily.   pantoprazole 40 MG tablet Commonly known as: PROTONIX Take 1 tablet (40 mg total) by mouth daily.    vitamin E 45 MG (100 UNITS) capsule Take 100 Units by mouth daily.   Zinc Acetate 50 MG Caps Take 50 mg by mouth in the morning.               Discharge Care Instructions  (From admission, onward)           Start     Ordered   06/21/23 0000  Discharge wound care:       Comments: Daily Wet to dry dressings changes to 2nd toe amputation site. Weave dry gauze between Toes. Kerlix wrap and ACE bandage from left foot up to knee. Wash left leg incisions with mild soap and water, pat dry. DO NOT soak the left foot or leg in bathtub   06/21/23 1136           Activity: activity as tolerated, no driving while on analgesics, and no heavy lifting for 4 weeks Diet: cardiac diet and low fat, low cholesterol diet Wound Care: keep wound clean and dry and change dressings daily. Wash incisions daily with mild soap and water, pat dry. Do not soak leg or foot in bathtub  Follow-up with [facility] in 2 weeks.  Signed: Graceann Congress 06/27/2023 9:38 AM

## 2023-06-28 DIAGNOSIS — I96 Gangrene, not elsewhere classified: Secondary | ICD-10-CM | POA: Diagnosis not present

## 2023-06-29 DIAGNOSIS — I70222 Atherosclerosis of native arteries of extremities with rest pain, left leg: Secondary | ICD-10-CM | POA: Diagnosis not present

## 2023-06-29 DIAGNOSIS — Z4781 Encounter for orthopedic aftercare following surgical amputation: Secondary | ICD-10-CM | POA: Diagnosis not present

## 2023-06-29 DIAGNOSIS — Z89422 Acquired absence of other left toe(s): Secondary | ICD-10-CM | POA: Diagnosis not present

## 2023-06-29 DIAGNOSIS — Z7902 Long term (current) use of antithrombotics/antiplatelets: Secondary | ICD-10-CM | POA: Diagnosis not present

## 2023-06-29 DIAGNOSIS — E876 Hypokalemia: Secondary | ICD-10-CM | POA: Diagnosis not present

## 2023-06-29 DIAGNOSIS — F17210 Nicotine dependence, cigarettes, uncomplicated: Secondary | ICD-10-CM | POA: Diagnosis not present

## 2023-06-29 DIAGNOSIS — Z7982 Long term (current) use of aspirin: Secondary | ICD-10-CM | POA: Diagnosis not present

## 2023-06-29 DIAGNOSIS — I10 Essential (primary) hypertension: Secondary | ICD-10-CM | POA: Diagnosis not present

## 2023-06-29 DIAGNOSIS — D649 Anemia, unspecified: Secondary | ICD-10-CM | POA: Diagnosis not present

## 2023-06-29 DIAGNOSIS — E1151 Type 2 diabetes mellitus with diabetic peripheral angiopathy without gangrene: Secondary | ICD-10-CM | POA: Diagnosis not present

## 2023-06-30 DIAGNOSIS — Z7982 Long term (current) use of aspirin: Secondary | ICD-10-CM | POA: Diagnosis not present

## 2023-06-30 DIAGNOSIS — Z89422 Acquired absence of other left toe(s): Secondary | ICD-10-CM | POA: Diagnosis not present

## 2023-06-30 DIAGNOSIS — E1151 Type 2 diabetes mellitus with diabetic peripheral angiopathy without gangrene: Secondary | ICD-10-CM | POA: Diagnosis not present

## 2023-06-30 DIAGNOSIS — Z7902 Long term (current) use of antithrombotics/antiplatelets: Secondary | ICD-10-CM | POA: Diagnosis not present

## 2023-06-30 DIAGNOSIS — E876 Hypokalemia: Secondary | ICD-10-CM | POA: Diagnosis not present

## 2023-06-30 DIAGNOSIS — Z4781 Encounter for orthopedic aftercare following surgical amputation: Secondary | ICD-10-CM | POA: Diagnosis not present

## 2023-06-30 DIAGNOSIS — D649 Anemia, unspecified: Secondary | ICD-10-CM | POA: Diagnosis not present

## 2023-06-30 DIAGNOSIS — I10 Essential (primary) hypertension: Secondary | ICD-10-CM | POA: Diagnosis not present

## 2023-06-30 DIAGNOSIS — I70222 Atherosclerosis of native arteries of extremities with rest pain, left leg: Secondary | ICD-10-CM | POA: Diagnosis not present

## 2023-06-30 DIAGNOSIS — F17210 Nicotine dependence, cigarettes, uncomplicated: Secondary | ICD-10-CM | POA: Diagnosis not present

## 2023-07-01 DIAGNOSIS — D649 Anemia, unspecified: Secondary | ICD-10-CM | POA: Diagnosis not present

## 2023-07-01 DIAGNOSIS — Z89422 Acquired absence of other left toe(s): Secondary | ICD-10-CM | POA: Diagnosis not present

## 2023-07-01 DIAGNOSIS — Z7982 Long term (current) use of aspirin: Secondary | ICD-10-CM | POA: Diagnosis not present

## 2023-07-01 DIAGNOSIS — Z4781 Encounter for orthopedic aftercare following surgical amputation: Secondary | ICD-10-CM | POA: Diagnosis not present

## 2023-07-01 DIAGNOSIS — E876 Hypokalemia: Secondary | ICD-10-CM | POA: Diagnosis not present

## 2023-07-01 DIAGNOSIS — Z7902 Long term (current) use of antithrombotics/antiplatelets: Secondary | ICD-10-CM | POA: Diagnosis not present

## 2023-07-01 DIAGNOSIS — E1151 Type 2 diabetes mellitus with diabetic peripheral angiopathy without gangrene: Secondary | ICD-10-CM | POA: Diagnosis not present

## 2023-07-01 DIAGNOSIS — F17210 Nicotine dependence, cigarettes, uncomplicated: Secondary | ICD-10-CM | POA: Diagnosis not present

## 2023-07-01 DIAGNOSIS — I10 Essential (primary) hypertension: Secondary | ICD-10-CM | POA: Diagnosis not present

## 2023-07-01 DIAGNOSIS — I70222 Atherosclerosis of native arteries of extremities with rest pain, left leg: Secondary | ICD-10-CM | POA: Diagnosis not present

## 2023-07-04 DIAGNOSIS — Z7902 Long term (current) use of antithrombotics/antiplatelets: Secondary | ICD-10-CM | POA: Diagnosis not present

## 2023-07-04 DIAGNOSIS — D649 Anemia, unspecified: Secondary | ICD-10-CM | POA: Diagnosis not present

## 2023-07-04 DIAGNOSIS — F17210 Nicotine dependence, cigarettes, uncomplicated: Secondary | ICD-10-CM | POA: Diagnosis not present

## 2023-07-04 DIAGNOSIS — I10 Essential (primary) hypertension: Secondary | ICD-10-CM | POA: Diagnosis not present

## 2023-07-04 DIAGNOSIS — I70222 Atherosclerosis of native arteries of extremities with rest pain, left leg: Secondary | ICD-10-CM | POA: Diagnosis not present

## 2023-07-04 DIAGNOSIS — Z4781 Encounter for orthopedic aftercare following surgical amputation: Secondary | ICD-10-CM | POA: Diagnosis not present

## 2023-07-04 DIAGNOSIS — E1151 Type 2 diabetes mellitus with diabetic peripheral angiopathy without gangrene: Secondary | ICD-10-CM | POA: Diagnosis not present

## 2023-07-04 DIAGNOSIS — Z7982 Long term (current) use of aspirin: Secondary | ICD-10-CM | POA: Diagnosis not present

## 2023-07-04 DIAGNOSIS — Z89422 Acquired absence of other left toe(s): Secondary | ICD-10-CM | POA: Diagnosis not present

## 2023-07-04 DIAGNOSIS — E876 Hypokalemia: Secondary | ICD-10-CM | POA: Diagnosis not present

## 2023-07-05 DIAGNOSIS — I96 Gangrene, not elsewhere classified: Secondary | ICD-10-CM | POA: Diagnosis not present

## 2023-07-06 DIAGNOSIS — Z7902 Long term (current) use of antithrombotics/antiplatelets: Secondary | ICD-10-CM | POA: Diagnosis not present

## 2023-07-06 DIAGNOSIS — I70222 Atherosclerosis of native arteries of extremities with rest pain, left leg: Secondary | ICD-10-CM | POA: Diagnosis not present

## 2023-07-06 DIAGNOSIS — D649 Anemia, unspecified: Secondary | ICD-10-CM | POA: Diagnosis not present

## 2023-07-06 DIAGNOSIS — Z7982 Long term (current) use of aspirin: Secondary | ICD-10-CM | POA: Diagnosis not present

## 2023-07-06 DIAGNOSIS — Z89422 Acquired absence of other left toe(s): Secondary | ICD-10-CM | POA: Diagnosis not present

## 2023-07-06 DIAGNOSIS — I10 Essential (primary) hypertension: Secondary | ICD-10-CM | POA: Diagnosis not present

## 2023-07-06 DIAGNOSIS — Z4781 Encounter for orthopedic aftercare following surgical amputation: Secondary | ICD-10-CM | POA: Diagnosis not present

## 2023-07-06 DIAGNOSIS — E876 Hypokalemia: Secondary | ICD-10-CM | POA: Diagnosis not present

## 2023-07-06 DIAGNOSIS — F17210 Nicotine dependence, cigarettes, uncomplicated: Secondary | ICD-10-CM | POA: Diagnosis not present

## 2023-07-06 DIAGNOSIS — E1151 Type 2 diabetes mellitus with diabetic peripheral angiopathy without gangrene: Secondary | ICD-10-CM | POA: Diagnosis not present

## 2023-07-07 DIAGNOSIS — I10 Essential (primary) hypertension: Secondary | ICD-10-CM | POA: Diagnosis not present

## 2023-07-07 DIAGNOSIS — F17210 Nicotine dependence, cigarettes, uncomplicated: Secondary | ICD-10-CM | POA: Diagnosis not present

## 2023-07-07 DIAGNOSIS — E1151 Type 2 diabetes mellitus with diabetic peripheral angiopathy without gangrene: Secondary | ICD-10-CM | POA: Diagnosis not present

## 2023-07-07 DIAGNOSIS — D649 Anemia, unspecified: Secondary | ICD-10-CM | POA: Diagnosis not present

## 2023-07-07 DIAGNOSIS — Z7982 Long term (current) use of aspirin: Secondary | ICD-10-CM | POA: Diagnosis not present

## 2023-07-07 DIAGNOSIS — Z4781 Encounter for orthopedic aftercare following surgical amputation: Secondary | ICD-10-CM | POA: Diagnosis not present

## 2023-07-07 DIAGNOSIS — Z7902 Long term (current) use of antithrombotics/antiplatelets: Secondary | ICD-10-CM | POA: Diagnosis not present

## 2023-07-07 DIAGNOSIS — Z89422 Acquired absence of other left toe(s): Secondary | ICD-10-CM | POA: Diagnosis not present

## 2023-07-07 DIAGNOSIS — I70222 Atherosclerosis of native arteries of extremities with rest pain, left leg: Secondary | ICD-10-CM | POA: Diagnosis not present

## 2023-07-07 DIAGNOSIS — E876 Hypokalemia: Secondary | ICD-10-CM | POA: Diagnosis not present

## 2023-07-08 ENCOUNTER — Encounter: Payer: Medicare HMO | Admitting: Vascular Surgery

## 2023-07-08 ENCOUNTER — Other Ambulatory Visit: Payer: Self-pay | Admitting: *Deleted

## 2023-07-08 DIAGNOSIS — D649 Anemia, unspecified: Secondary | ICD-10-CM | POA: Diagnosis not present

## 2023-07-08 DIAGNOSIS — Z7982 Long term (current) use of aspirin: Secondary | ICD-10-CM | POA: Diagnosis not present

## 2023-07-08 DIAGNOSIS — I10 Essential (primary) hypertension: Secondary | ICD-10-CM | POA: Diagnosis not present

## 2023-07-08 DIAGNOSIS — F17210 Nicotine dependence, cigarettes, uncomplicated: Secondary | ICD-10-CM | POA: Diagnosis not present

## 2023-07-08 DIAGNOSIS — E1151 Type 2 diabetes mellitus with diabetic peripheral angiopathy without gangrene: Secondary | ICD-10-CM | POA: Diagnosis not present

## 2023-07-08 DIAGNOSIS — Z7902 Long term (current) use of antithrombotics/antiplatelets: Secondary | ICD-10-CM | POA: Diagnosis not present

## 2023-07-08 DIAGNOSIS — I70222 Atherosclerosis of native arteries of extremities with rest pain, left leg: Secondary | ICD-10-CM | POA: Diagnosis not present

## 2023-07-08 DIAGNOSIS — Z89422 Acquired absence of other left toe(s): Secondary | ICD-10-CM | POA: Diagnosis not present

## 2023-07-08 DIAGNOSIS — I70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: Secondary | ICD-10-CM

## 2023-07-08 DIAGNOSIS — E876 Hypokalemia: Secondary | ICD-10-CM | POA: Diagnosis not present

## 2023-07-08 DIAGNOSIS — Z4781 Encounter for orthopedic aftercare following surgical amputation: Secondary | ICD-10-CM | POA: Diagnosis not present

## 2023-07-12 DIAGNOSIS — I96 Gangrene, not elsewhere classified: Secondary | ICD-10-CM | POA: Diagnosis not present

## 2023-07-13 NOTE — Progress Notes (Signed)
Patient ID: [name] [name], male   DOB: 1951-04-30, 72 y.o.   MRN: [id number]  Reason for Consult: Routine Post Op   Referred by Ignatius Specking, MD  Subjective:     HPI  [name] [name] is a 72 y.o. male with history of hypertension diabetes and severe PAD who recently underwent left femoral to posterior tibial bypass with reversed saphenous vein approximately 1 month ago.  He has been following in the wound care clinic for his left forefoot wounds and second toe amputation.  He denies significant pain in the foot.  He is ambulating.  He is compliant with aspirin statin and Plavix.  He quit smoking in September of this year.  Past Medical History:  Diagnosis Date   Arthritis    Diabetes mellitus without complication (HCC)    GERD (gastroesophageal reflux disease)    Headache    Peripheral vascular disease (HCC)    Family History  Problem Relation Age of Onset   Stroke Mother    Cancer Father    Heart disease Sister    Cancer Sister    Past Surgical History:  Procedure Laterality Date   ABDOMINAL AORTOGRAM W/LOWER EXTREMITY N/A 06/08/2023   Procedure: ABDOMINAL AORTOGRAM W/LOWER EXTREMITY;  Surgeon: Victorino Sparrow, MD;  Location: [facility] INVASIVE CV LAB;  Service: Cardiovascular;  Laterality: N/A;   APPENDECTOMY  1956   BACK SURGERY     COLONOSCOPY  1997   FEMORAL-TIBIAL BYPASS GRAFT Left 06/14/2023   Procedure: LEFT SUPERFICIAL FEMORAL-POSTERIOR TIBIAL ARTERY BYPASS;  Surgeon: Daria Pastures, MD;  Location: [facility] OR;  Service: Vascular;  Laterality: Left;   KNEE ARTHROSCOPY Left    LUMBAR LAMINECTOMY/DECOMPRESSION MICRODISCECTOMY Bilateral 10/18/2017   Procedure: Bilateral Lumbar Two-Three Lumbar Three-Four Laminotomies;  Surgeon: Barnett Abu, MD;  Location: [facility] OR;  Service: Neurosurgery;  Laterality: Bilateral;  Bilateral L2-3 L3-4 Laminotomies   NECK SURGERY  2001   PERIPHERAL VASCULAR INTERVENTION  06/08/2023   Procedure: PERIPHERAL VASCULAR INTERVENTION;  Surgeon: Victorino Sparrow, MD;  Location: [facility] INVASIVE CV LAB;  Service: Cardiovascular;;  Lt Common Iliac   VEIN HARVEST Left 06/14/2023   Procedure: SAPHENOUS VEIN HARVEST;  Surgeon: Daria Pastures, MD;  Location: [facility] OR;  Service: Vascular;  Laterality: Left;   WOUND DEBRIDEMENT Left 06/20/2023   Procedure: DEBRIDEMENT WOUND;  Surgeon: Daria Pastures, MD;  Location: [facility] OR;  Service: Vascular;  Laterality: Left;    Short Social History:  Social History   Tobacco Use   Smoking status: Former    Current packs/day: 0.00    Average packs/day: 0.3 packs/day for 59.0 years (14.8 ttl pk-yrs)    Types: Cigarettes    Start date: 20    Quit date: 06/01/2023    Years since quitting: 0.1   Smokeless tobacco: Never  Substance Use Topics   Alcohol use: Not Currently    Comment: occ    Allergies  Allergen Reactions   Gadobutrol Hives, Itching and Swelling    After injection of MRI contrast Gadavist pt experienced facial itching, stuffy nose, eye swelling   Iodinated Contrast Media Hives    Current Outpatient Medications  Medication Sig Dispense Refill   amLODipine (NORVASC) 5 MG tablet Take 1 tablet (5 mg total) by mouth daily. 30 tablet 0   amoxicillin-clavulanate (AUGMENTIN) 875-125 MG tablet Take 1 tablet by mouth every 12 (twelve) hours. 36 tablet 0   Ascorbic Acid 1000 MG TBCR Take 1,000 mg by mouth in the morning and at  bedtime.     aspirin 81 MG chewable tablet Chew 1 tablet (81 mg total) by mouth daily.     atorvastatin (LIPITOR) 40 MG tablet Take 1 tablet (40 mg total) by mouth daily. 30 tablet 1   clopidogrel (PLAVIX) 75 MG tablet Take 1 tablet (75 mg total) by mouth daily with breakfast. 30 tablet 1   HYDROcodone-acetaminophen (NORCO/VICODIN) 5-325 MG tablet Take 1 tablet by mouth every 4 (four) hours as needed for severe pain (pain score 7-10). 30 tablet 0   levofloxacin (LEVAQUIN) 500 MG tablet Take 1 tablet (500 mg total) by mouth daily. 18 tablet 0   pantoprazole (PROTONIX) 40 MG tablet  Take 1 tablet (40 mg total) by mouth daily. 30 tablet 0   vitamin B-12 (CYANOCOBALAMIN) 1000 MCG tablet Take 1,000 mcg by mouth daily.     vitamin E 100 UNIT capsule Take 100 Units by mouth daily.     Zinc Acetate 50 MG CAPS Take 50 mg by mouth in the morning.     No current facility-administered medications for this visit.   Facility-Administered Medications Ordered in Other Visits  Medication Dose Route Frequency Provider Last Rate Last Admin   ondansetron (ZOFRAN) 4 mg in sodium chloride 0.9 % 50 mL IVPB  4 mg Intravenous Q6H PRN Barnett Abu, MD        REVIEW OF SYSTEMS  Negative other than noted in HPI     Objective:  Objective   Vitals:   07/15/23 1020  BP: 138/66  Pulse: 63  Resp: 20  Temp: 97.7 F (36.5 C)  SpO2: 99%  Weight: 198 lb 12.8 oz (90.2 kg)  Height: 5\' 8"  (1.727 m)   Body mass index is 30.23 kg/m.  Physical Exam General: no acute distress Cardiac: hemodynamically stable, nontachycardic Pulm: normal work of breathing Neuro: alert, no focal deficit Extremities: Well-healing left leg surgical incisions, left second toe amputated sensate with pink granulation tissue.  Right first toe with dry eschar over MTP joint.  1+ edema bilaterally   Data: ABI: +--------+------------------+-----+----------+--------+  Right  Rt Pressure (mmHg)IndexWaveform  Comment   +--------+------------------+-----+----------+--------+  [id number]                                       +--------+------------------+-----+----------+--------+  PTA    91                0.64 monophasic          +--------+------------------+-----+----------+--------+  DP     97                0.68 monophasic          +--------+------------------+-----+----------+--------+   +--------+------------------+-----+----------+-------+  Left   Lt Pressure (mmHg)IndexWaveform  Comment  +--------+------------------+-----+----------+-------+  [id number]                                       +--------+------------------+-----+----------+-------+  PTA    160               1.13 biphasic           +--------+------------------+-----+----------+-------+  DP     101               0.71 monophasic         +--------+------------------+-----+----------+-------+     LLE Duplex: +----------+--------+-----+---------------+--------+--------+  LEFT  PSV cm/sRatioStenosis       WaveformComments  +----------+--------+-----+---------------+--------+--------+  CFA Distal204          50-74% stenosis                  +----------+--------+-----+---------------+--------+--------+  SFA Prox  202          50-74% stenosis                  +----------+--------+-----+---------------+--------+--------+  SFA Mid   152          30-49% stenosis                  +----------+--------+-----+---------------+--------+--------+     Left Graft #1: mid SFA to posterior tibial  +--------------------+--------+---------------+--------+----------------+                     PSV cm/sStenosis       WaveformComments          +--------------------+--------+---------------+--------+----------------+  Inflow             152                                              +--------------------+--------+---------------+--------+----------------+  Proximal Anastomosis360     >70% stenosis                            +--------------------+--------+---------------+--------+----------------+  Proximal Graft      180     50-70% stenosis        low end of range  +--------------------+--------+---------------+--------+----------------+  Mid Graft           136                                              +--------------------+--------+---------------+--------+----------------+  Distal Graft        141                                              +--------------------+--------+---------------+--------+----------------+  Distal  Anastomosis  183     50-70% stenosis                          +--------------------+--------+---------------+--------+----------------+  Outflow            224     50-74% stenosis                          +--------------------+--------+---------------+--------+----------------+      Assessment/Plan:     [name] [name] is a 72 y.o. 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  He has an ABI of 1 and duplex with slightly elevated velocity at the proximal anastomosis although was only 1 month out and we will plan to continue surveillance.  Left second toe wound appears slow healing but with pain granulation tissue, although the right first toe has dry eschar over the MTP joint.  He is seeing wound care regularly and was planned to start hyperbaric  oxygen therapy.  Will defer to wound center for continued wound care Will plan to continue surveillance of a following up with 3 months with an ABI and duplex   Recommendations to optimize cardiovascular risk: Abstinence from all tobacco products. Blood glucose control with goal A1c < 7%. Blood pressure control with goal blood pressure < 140/90 mmHg. Lipid reduction therapy with goal LDL-C <100 mg/dL  Aspirin 81mg  PO QD.  Atorvastatin 40-80mg  PO QD (or other "high intensity" statin therapy).     Daria Pastures MD [facility]

## 2023-07-15 ENCOUNTER — Ambulatory Visit (HOSPITAL_COMMUNITY)
Admission: RE | Admit: 2023-07-15 | Discharge: 2023-07-15 | Disposition: A | Discharge status: Home / Self Care | Payer: Medicare HMO | Source: Ambulatory Visit | Attending: Vascular Surgery

## 2023-07-15 ENCOUNTER — Ambulatory Visit (INDEPENDENT_AMBULATORY_CARE_PROVIDER_SITE_OTHER): Payer: Medicare HMO | Admitting: Vascular Surgery

## 2023-07-15 ENCOUNTER — Ambulatory Visit (INDEPENDENT_AMBULATORY_CARE_PROVIDER_SITE_OTHER)
Admit: 2023-07-15 | Discharge: 2023-07-15 | Disposition: A | Discharge status: Home / Self Care | Payer: Medicare HMO | Attending: Vascular Surgery

## 2023-07-15 ENCOUNTER — Encounter: Payer: Self-pay | Admitting: Vascular Surgery

## 2023-07-15 VITALS — BP 138/66 | HR 63 | Temp 97.7°F | Resp 20 | Ht 68.0 in | Wt 198.8 lb

## 2023-07-15 DIAGNOSIS — I70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: Secondary | ICD-10-CM

## 2023-07-15 DIAGNOSIS — I70222 Atherosclerosis of native arteries of extremities with rest pain, left leg: Secondary | ICD-10-CM | POA: Diagnosis not present

## 2023-07-15 LAB — VAS US ABI WITH/WO TBI
Left ABI: 1.13
Right ABI: 0.68

## 2023-07-22 ENCOUNTER — Encounter (HOSPITAL_BASED_OUTPATIENT_CLINIC_OR_DEPARTMENT_OTHER): Payer: Medicare HMO | Attending: General Surgery | Admitting: General Surgery

## 2023-07-22 DIAGNOSIS — I70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: Secondary | ICD-10-CM | POA: Diagnosis not present

## 2023-07-22 DIAGNOSIS — E1140 Type 2 diabetes mellitus with diabetic neuropathy, unspecified: Secondary | ICD-10-CM | POA: Diagnosis not present

## 2023-07-22 DIAGNOSIS — E11621 Type 2 diabetes mellitus with foot ulcer: Secondary | ICD-10-CM | POA: Insufficient documentation

## 2023-07-22 DIAGNOSIS — Z87891 Personal history of nicotine dependence: Secondary | ICD-10-CM | POA: Insufficient documentation

## 2023-07-22 DIAGNOSIS — I10 Essential (primary) hypertension: Secondary | ICD-10-CM | POA: Diagnosis not present

## 2023-07-22 DIAGNOSIS — L97523 Non-pressure chronic ulcer of other part of left foot with necrosis of muscle: Secondary | ICD-10-CM | POA: Insufficient documentation

## 2023-07-22 DIAGNOSIS — L97526 Non-pressure chronic ulcer of other part of left foot with bone involvement without evidence of necrosis: Secondary | ICD-10-CM | POA: Insufficient documentation

## 2023-07-22 DIAGNOSIS — L97522 Non-pressure chronic ulcer of other part of left foot with fat layer exposed: Secondary | ICD-10-CM | POA: Insufficient documentation

## 2023-07-22 DIAGNOSIS — E11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: Secondary | ICD-10-CM | POA: Diagnosis not present

## 2023-07-22 NOTE — Progress Notes (Signed)
Seeman, [name] [name] ([id number]) [phone] [id number] Page 1 of 4 Visit Report for 07/22/2023 Abuse Risk Screen Details Patient Name: Date of Service: [name] [name], [name] [name]. 07/22/2023 8:45 [name] [name] [name] Medical Record Number: [id number] Patient Account Number: [id number] Date of Birth/Sex: Treating [name] [name]: [birth year] (72 y.o. [name] [name] Primary Care [name] [name]: [name] [name] Other Clinician: Referring [name] [name]: Treating [name] [name]/Extender: [name] [name] Weeks in Treatment: 0 Abuse Risk Screen Items Answer ABUSE RISK SCREEN: Has anyone close to you tried to hurt or harm you recentlyo No Do you feel uncomfortable with anyone in your familyo No Has anyone forced you do things that you didnt want to doo No Electronic Signature(s) [name] [name]: 07/22/2023 1:02:24 PM By: [name] Deed [name] [name], [name] [name] Entered By: [name] [name] on 07/22/2023 06:09:32 -------------------------------------------------------------------------------- Activities of Daily Living Details Patient Name: Date of Service: [name] [name], [name] [name]. 07/22/2023 8:45 [name] [name] [name] Medical Record Number: [id number] Patient Account Number: [id number] Date of Birth/Sex: Treating [name] [name]: May 05, 1951 (72 y.o. [name] [name] Primary Care [name] [name]: [name] [name] Other Clinician: Referring Theresa Wedel: Treating [name] [name]/Extender: [name] [name], [name] [name] Weeks in Treatment: 0 Activities of Daily Living Items Answer Activities of Daily Living (Please select one for each item) Drive Automobile Not Able T Medications ake Completely Able Use T elephone Completely Able Care for Appearance Completely Able Use T oilet Completely Able Bath / Shower Completely Able Dress Self Completely Able Feed Self Completely Able Walk Need Assistance Get In / Out Bed Need Assistance Housework Not Able Prepare Meals Need Assistance Handle Money Completely Able Shop for Self Need Assistance Electronic Signature(s) [name] [name]: 07/22/2023 1:02:24 PM By:  [name] Deed [name] [name], [name] [name] Entered By: [name] [name] on 07/22/2023 06:10:16 [name] [name] ([id number]) [id number] Page 2 of 4 -------------------------------------------------------------------------------- Education Screening Details Patient Name: Date of Service: [name] [name], [name] [name]. 07/22/2023 8:45 [name] [name] [name] Medical Record Number: [id number] Patient Account Number: [id number] Date of Birth/Sex: Treating [name] [name]: 02-26-1951 (72 y.o. [name] [name] Primary Care [name] [name]: [name] [name] Other Clinician: Referring [name] [name]: Treating [name] [name]/Extender: [name] [name] Weeks in Treatment: 0 Primary Learner Assessed: Patient Learning Preferences/Education Level/Primary Language Learning Preference: Explanation, Demonstration, Video, Printed Material Highest Education Level: College or Above Preferred Language: English Cognitive Barrier Language Barrier: No Translator Needed: No Memory Deficit: No Emotional Barrier: No Cultural/Religious Beliefs Affecting Medical Care: No Physical Barrier Impaired Vision: No Impaired Hearing: No Decreased Hand dexterity: No Knowledge/Comprehension Knowledge Level: High Comprehension Level: High Ability to understand written instructions: High Ability to understand verbal instructions: High Motivation Anxiety Level: Calm Cooperation: Cooperative Education Importance: Acknowledges Need Interest in Health Problems: Asks Questions Perception: Coherent Willingness to Engage in Self-Management High Activities: Readiness to Engage in Self-Management High Activities: Electronic Signature(s) [name] [name]: 07/22/2023 1:02:24 PM By: [name] Deed [name] [name], [name] [name] Entered By: [name] [name] on 07/22/2023 06:10:42 -------------------------------------------------------------------------------- Fall Risk Assessment Details Patient Name: Date of Service: [name] [name], [name] [name]. 07/22/2023 8:45 [name] [name] [name] Medical Record Number:  [id number] Patient Account Number: [id number] Date of Birth/Sex: Treating [name] [name]: October 07, 1950 (72 y.o. [name] [name] Primary Care [name] [name]: [name] [name] Other Clinician: Referring [name] [name]: Treating [name] [name]/Extender: [name] [name] Weeks in Treatment: 0 Fall Risk Assessment Items Have you had 2 or more falls in the last 12 monthso 0 No [name] [name], [name] [name] ([id number]) [phone] [id number] Page 3 of 4 Have you had any fall that resulted in injury in the last 12 monthso 0 No FALLS RISK SCREEN History of falling - immediate or within 3 months 0 No Secondary diagnosis (Do you have 2 or more medical  diagnoseso) 0 No Ambulatory aid None/bed rest/wheelchair/nurse 0 No Crutches/cane/walker 15 Yes Furniture 0 No Intravenous therapy Access/Saline/Heparin Lock 0 No Gait/Transferring Normal/ bed rest/ wheelchair 0 Yes Weak (short steps with or without shuffle, stooped but able to lift head while walking, may seek 0 No support from furniture) Impaired (short steps with shuffle, may have difficulty arising from chair, head down, impaired 0 No balance) Mental Status Oriented to own ability 0 Yes Electronic Signature(s) [name] [name]: 07/22/2023 1:02:24 PM By: [name] Deed [name] [name], [name] [name] Entered By: [name] [name] on 07/22/2023 06:11:13 -------------------------------------------------------------------------------- Foot Assessment Details Patient Name: Date of Service: [name] [name], [name] [name]. 07/22/2023 8:45 [name] [name] [name] Medical Record Number: [id number] Patient Account Number: [id number] Date of Birth/Sex: Treating [name] [name]: April 27, 1951 (72 y.o. [name] [name] Primary Care Develle Sievers: [name] [name] Other Clinician: Referring Rebekkah Powless: Treating Nachmen Mansel/Extender: [name] [name], [name] [name] Weeks in Treatment: 0 Foot Assessment Items Site Locations + = Sensation present, - = Sensation absent, C = Callus, U = Ulcer R = Redness, W = Warmth, [name] [name] = Maceration, PU = Pre-ulcerative lesion F =  Fissure, S = Swelling, D = Dryness Assessment Right: Left: Other Deformity: No No Prior Foot Ulcer: No Yes Prior Amputation: No No Charcot Joint: No No Ambulatory Status: Ambulatory With Help Assistance Device: [name] [name], [name] [name] ([id number]) [phone] [id number] Page 4 of 4 Gait: Steady Electronic Signature(s) [name] [name]: 07/22/2023 1:02:24 PM By: [name] Deed [name] [name], [name] [name] Entered By: [name] [name] on 07/22/2023 06:14:16 -------------------------------------------------------------------------------- Nutrition Risk Screening Details Patient Name: Date of Service: [name] [name], [name] [name]. 07/22/2023 8:45 [name] [name] [name] Medical Record Number: [id number] Patient Account Number: [id number] Date of Birth/Sex: Treating [name] [name]: [birth year] (72 y.o. [name] [name] Primary Care Monda Chastain: [name] [name] Other Clinician: Referring Jae Bruck: Treating Egidio Lofgren/Extender: [name] [name], [name] [name] Weeks in Treatment: 0 Height (in): 68 Weight (lbs): 198 Body Mass Index (BMI): 30.1 Nutrition Risk Screening Items Score Screening NUTRITION RISK SCREEN: I have an illness or condition that made me change the kind and/or amount of food I eat 0 No I eat fewer than two meals per day 0 No I eat few fruits and vegetables, or milk products 0 No I have three or more drinks of beer, liquor or wine almost every day 0 No I have tooth or mouth problems that make it hard for me to eat 0 No I don't always have enough money to buy the food I need 0 No I eat alone most of the time 0 No I take three or more different prescribed or over-the-counter drugs [name] day 1 Yes Without wanting to, I have lost or gained 10 pounds in the last six months 2 Yes I am not always physically able to shop, cook and/or feed myself 0 No Nutrition Protocols Good Risk Protocol Provide education on elevated blood Moderate Risk Protocol 0 sugars and impact on wound healing, as applicable High Risk Proctocol Risk Level: Moderate  Risk Score: 3 Electronic Signature(s) [name] [name]: 07/22/2023 1:02:24 PM By: [name] Deed [name] [name], [name] [name] Entered By: [name] [name] on 07/22/2023 06:11:44

## 2023-07-22 NOTE — Progress Notes (Signed)
[name] [name], [name] [name] ([id number]) [phone].pdf Page 1 of 15 Visit Report for 07/22/2023 Chief Complaint Document Details Patient Name: Date of Service: [name], [name] 07/22/2023 8:45 [name] Medical Record Number: [id number] Patient Account Number: [id number] Date of Birth/Sex: Treating RN: 10/29/1950 (72 y.o. M) Primary Care Provider: Doreen Beam Other Clinician: Referring Provider: Treating Provider/Extender: Vonzella Nipple, Dhruv Weeks in Treatment: 0 Information Obtained from: Patient Chief Complaint Patients presents for treatment of open diabetic ulcers in the setting of severe peripheral vascular disease Electronic Signature(s) Signed: 07/22/2023 12:21:29 PM By: Duanne Guess MD FACS Entered By: Duanne Guess on 07/22/2023 09:21:29 -------------------------------------------------------------------------------- Debridement Details Patient Name: Date of Service: [name] [name], [name] [name]. 07/22/2023 8:45 [name] Medical Record Number: [id number] Patient Account Number: [id number] Date of Birth/Sex: Treating RN: October 13, 1950 (72 y.o. Marlan Palau Primary Care Provider: Doreen Beam Other Clinician: Referring Provider: Treating Provider/Extender: Teofilo Pod Weeks in Treatment: 0 Debridement Performed for Assessment: Wound #1 Left,Dorsal Foot Performed By: Physician Duanne Guess, MD The following information was scribed by: Samuella Bruin The information was scribed for: Duanne Guess Debridement Type: Debridement Severity of Tissue Pre Debridement: Fat layer exposed Level of Consciousness (Pre-procedure): Awake and Alert Pre-procedure Verification/Time Out Yes - 09:45 Taken: Start Time: 09:45 Pain Control: Lidocaine 4% T opical Solution Percent of Wound Bed Debrided: 100% T Area Debrided (cm): otal 0.47 Tissue and other material debrided: Non-Viable, Slough, Subcutaneous, Slough Level: Skin/Subcutaneous  Tissue Debridement Description: Excisional Instrument: Curette, Forceps, Scissors Bleeding: Minimum Hemostasis Achieved: Pressure Response to Treatment: Procedure was tolerated well Level of Consciousness (Post- Awake and Alert procedure): Post Debridement Measurements of Total Wound Length: (cm) 1 Width: (cm) 0.6 Depth: (cm) 0.1 Volume: (cm) 0.047 Character of Wound/Ulcer Post Debridement: Improved Severity of Tissue Post Debridement: Fat layer exposed Hosein, Yichen [name] ([id number]) [id number] Page 2 of 15 Post Procedure Diagnosis Same as Pre-procedure Electronic Signature(s) Signed: 07/22/2023 11:57:20 AM By: Samuella Bruin Signed: 07/22/2023 12:51:55 PM By: Duanne Guess MD FACS Entered By: Samuella Bruin on 07/22/2023 06:51:05 -------------------------------------------------------------------------------- Debridement Details Patient Name: Date of Service: [name] [name], [name] [name]. 07/22/2023 8:45 [name] Medical Record Number: [id number] Patient Account Number: [id number] Date of Birth/Sex: Treating RN: 01-28-1951 (72 y.o. [name] [name] Primary Care Provider: Doreen Beam Other Clinician: Referring Provider: Treating Provider/Extender: Teofilo Pod Weeks in Treatment: 0 Debridement Performed for Assessment: Wound #2 Left Amputation Site - Toe Performed By: Physician Duanne Guess, MD The following information was scribed by: Samuella Bruin The information was scribed for: Duanne Guess Debridement Type: Debridement Severity of Tissue Pre Debridement: Bone involvement without necrosis Level of Consciousness (Pre-procedure): Awake and Alert Pre-procedure Verification/Time Out Yes - 09:45 Taken: Start Time: 09:45 Pain Control: Lidocaine 4% T opical Solution Percent of Wound Bed Debrided: 100% T Area Debrided (cm): otal 9.81 Tissue and other material debrided: Viable, Non-Viable, Slough, Subcutaneous, Slough Level:  Skin/Subcutaneous Tissue Debridement Description: Excisional Instrument: Curette Bleeding: Minimum Hemostasis Achieved: Pressure Response to Treatment: Procedure was tolerated well Level of Consciousness (Post- Awake and Alert procedure): Post Debridement Measurements of Total Wound Length: (cm) 5 Width: (cm) 2.5 Depth: (cm) 0.5 Volume: (cm) 4.909 Character of Wound/Ulcer Post Debridement: Requires Further Debridement Severity of Tissue Post Debridement: Bone involvement without necrosis Post Procedure Diagnosis Same as Pre-procedure Electronic Signature(s) Signed: 07/22/2023 12:51:55 PM By: Duanne Guess MD FACS Signed: 07/22/2023 1:02:24 PM By: Zenaida Deed RN, BSN Previous Signature: 07/22/2023 11:57:20 AM Version By: Samuella Bruin Entered By: Zenaida Deed on 07/22/2023 09:25:16 Kirwan, Hassaan  [name] ([id number]) [id number] Page 3 of 15 -------------------------------------------------------------------------------- Debridement Details Patient Name: Date of Service: [name] [name] 07/22/2023 8:45 [name] Medical Record Number: [id number] Patient Account Number: [id number] Date of Birth/Sex: Treating RN: 1951/05/12 (72 y.o. [name] [name] Primary Care Provider: Doreen Beam Other Clinician: Referring Provider: Treating Provider/Extender: Teofilo Pod Weeks in Treatment: 0 Debridement Performed for Assessment: Wound #3 Left T Great oe Performed By: Physician Duanne Guess, MD The following information was scribed by: Zenaida Deed The information was scribed for: Duanne Guess Debridement Type: Debridement Severity of Tissue Pre Debridement: Fat layer exposed Level of Consciousness (Pre-procedure): Awake and Alert Pre-procedure Verification/Time Out Yes - 09:45 Taken: Start Time: 09:45 Pain Control: Lidocaine 4% T opical Solution Percent of Wound Bed Debrided: 100% T Area Debrided (cm): otal 17.66 Tissue  and other material debrided: Viable, Non-Viable, Eschar, Muscle, Slough, Subcutaneous, Slough Level: Skin/Subcutaneous Tissue/Muscle Debridement Description: Excisional Instrument: Curette, Forceps, Scissors Bleeding: Minimum Hemostasis Achieved: Pressure Response to Treatment: Procedure was tolerated well Level of Consciousness (Post- Awake and Alert procedure): Post Debridement Measurements of Total Wound Length: (cm) 7.5 Width: (cm) 3 Depth: (cm) 0.3 Volume: (cm) 5.301 Character of Wound/Ulcer Post Debridement: Improved Severity of Tissue Post Debridement: Necrosis of muscle Post Procedure Diagnosis Same as Pre-procedure Electronic Signature(s) Signed: 07/22/2023 12:51:55 PM By: Duanne Guess MD FACS Signed: 07/22/2023 1:02:24 PM By: Zenaida Deed RN, BSN Entered By: Zenaida Deed on 07/22/2023 09:26:23 -------------------------------------------------------------------------------- HPI Details Patient Name: Date of Service: [name] [name], Eduard [name]. 07/22/2023 8:45 [name] Medical Record Number: [id number] Patient Account Number: [id number] Date of Birth/Sex: Treating RN: 01/03/1951 (72 y.o. M) Primary Care Provider: Doreen Beam Other Clinician: Referring Provider: Treating Provider/Extender: Vonzella Nipple, Dhruv Weeks in Treatment: 0 History of Present Illness HPI Description: ADMISSION 07/22/2023 ***POST-REVASCULARIZATION ABI/DOPPLER:*** ABI: +--------+------------------+-----+----------+--------+ Right Rt Pressure (mmHg)IndexWaveform Comment  +--------+------------------+-----+----------+--------+ [id number]     [place], Umair [name] ([id number]) [id number] Page 4 of 15 +--------+------------------+-----+----------+--------+ PTA 91 0.64 monophasic  +--------+------------------+-----+----------+--------+ DP 97 0.68 monophasic   +--------+------------------+-----+----------+--------+ +--------+------------------+-----+----------+-------+ Left Lt Pressure (mmHg)IndexWaveform Comment +--------+------------------+-----+----------+-------+ [id number]     +--------+------------------+-----+----------+-------+ PTA 160 1.13 biphasic   +--------+------------------+-----+----------+-------+ DP 101 0.71 monophasic  +--------+------------------+-----+----------+-------+ LLE Duplex: +----------+--------+-----+---------------+--------+--------+ LEFT PSV cm/sRatioStenosis WaveformComments +----------+--------+-----+---------------+--------+--------+ CFA Distal204  50-74% stenosis   +----------+--------+-----+---------------+--------+--------+ SFA Prox 202  50-74% stenosis   +----------+--------+-----+---------------+--------+--------+ SFA Mid 152  30-49% stenosis   +----------+--------+-----+---------------+--------+--------+ Left Graft #1: mid SFA to posterior tibial +--------------------+--------+---------------+--------+----------------+  PSV cm/sStenosis WaveformComments  +--------------------+--------+---------------+--------+----------------+ Inflow 152     +--------------------+--------+---------------+--------+----------------+ Proximal Anastomosis360 >70% stenosis    +--------------------+--------+---------------+--------+----------------+ Proximal Graft 180 50-70% stenosis low end of range +--------------------+--------+---------------+--------+----------------+ Mid Graft 136     +--------------------+--------+---------------+--------+----------------+ Distal Graft 141     +--------------------+--------+---------------+--------+----------------+ Distal Anastomosis 183 50-70% stenosis   +--------------------+--------+---------------+--------+----------------+ Outflow 224 50-74% stenosis    +--------------------+--------+---------------+--------+----------------+ This is [name] 72 year old type II diabetic (last hemoglobin A1c 6.7% on June 07, 2023) with severe peripheral vascular occlusive disease. 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 Preoperative MRI did not demonstrate evidence of osteomyelitis. Unfortunately, ABIs were not performed prior to that operation and postoperatively, ABIs were done that showed severe bilateral lower extremity arterial occlusive disease with minimal perfusion to the left lower leg. He was transferred to the vascular surgery service at [facility]. An arteriogram was done that showed occlusion of the left common iliac artery. This was stented, but additional inflow was necessary to provide better blood flow to the foot. He subsequently underwent [name] femoral  to tibial artery bypass with saphenous vein graft on October 8. 1 month post bypass arterial studies are copied above. He continued to receive wound care at [facility]. His last visit there was on 12 November. Their note describes the base of the ray amputation appearing viable with buds of granulation appearing. The surrounding digits appeared to be marginal with ischemic eschar formation. They have been doing Dakin's wet-to-dry dressings. He was referred to our center to be evaluated for hyperbaric oxygen therapy. Electronic Signature(s) Signed: 07/22/2023 8:43:04 AM By: Duanne Guess MD FACS Previous Signature: 07/22/2023 8:34:56 AM Version By: Duanne Guess MD FACS Entered By: Duanne Guess on 07/22/2023 05:43:04 -------------------------------------------------------------------------------- Physical Exam Details Patient Name: Date of Service: [name] [name], [name] [name]. 07/22/2023 8:45 [name] Medical Record Number: [id number] Patient Account Number: [id number] Date of Birth/Sex: Treating RN: [birth year] (72 y.o. M) Primary Care  Provider: Doreen Beam Other Clinician: Referring Provider: Treating Provider/Extender: Vonzella Nipple, Dhruv Weeks in Treatment: 0 Constitutional Hypertensive, asymptomatic. . . . No acute distress. Ears, Nose, Mouth, and Throat Rickman, Ashur [name] ([id number]) [phone].pdf Page 5 of 15 . Respiratory Normal work of breathing on room air. Clear to auscultation bilaterally. Cardiovascular . Notes 07/22/2023: There is [name] small ulcer on the dorsum of the patient's midfoot with some slough accumulation. He says this wound is quite tender. The amputation site has [name] hole in the center that probes to bone. There is slough accumulation on that surface with good granulation tissue underneath. The patient's great toe has areas of ischemic gangrene. The muscle is involved underneath [name] layer of black eschar. Electronic Signature(s) Signed: 07/22/2023 12:35:17 PM By: Duanne Guess MD FACS Previous Signature: 07/22/2023 12:28:39 PM Version By: Duanne Guess MD FACS Entered By: Duanne Guess on 07/22/2023 09:35:17 -------------------------------------------------------------------------------- Physician Orders Details Patient Name: Date of Service: [name] [name], [name] [name]. 07/22/2023 8:45 [name] Medical Record Number: [id number] Patient Account Number: [id number] Date of Birth/Sex: Treating RN: 12-18-1950 (72 y.o. 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 - Dr. Lady Gary - room 1 Return [name] Anesthetic (In clinic) Topical Lidocaine 4% applied to wound bed Bathing/ Shower/ Hygiene May shower and wash wound with soap and water. Edema Control - Orders / Instructions Elevate legs to the level of the heart or above for 30 minutes daily and/or when sitting for 3-4 times [name] day throughout the day. Avoid standing for long periods of time. Additional Orders / Instructions Follow Nutritious Diet - 70-100 grams of protein [name] day - can supplement with protein shakes ensure/premier vitamin C start 500 mg once [name] day and if tolerated then take twice [name] day zinc 30-50 mg once [name] day Home Health New wound care orders this week; continue Home Health for wound care. May  utilize formulary equivalent dressing for wound treatment orders unless otherwise specified. Dressing changes to be completed by Home Health on Monday / Wednesday / Friday except when patient has scheduled visit at [facility]. - ONLY USE MEDIHONEY IF [facility] IS NOT AVAILABLE Other Home Health Orders/Instructions: - [facility] [place], Joab [name] ([id number]) [phone].pdf Page 6 of 15 Hyperbaric Oxygen Therapy Evaluate for HBO Therapy Indication: - Wagner 4 diabetic foot ulcer If appropriate for treatment, begin HBOT per protocol: 2.5 ATA for 90 Minutes with 2 Five (5) Minute [name] Breaks ir Total Number of Treatments: - 40 One treatments per day (delivered Monday through Friday unless otherwise specified in Special Instructions below): Finger stick Blood Glucose Pre- and  Post- HBOT Treatment. Follow Hyperbaric Oxygen Glycemia Protocol Give two 4oz orange juices in addition to Glucerna when the glycemic protocol is used. [name] frin (Oxymetazoline HCL) 0.05% nasal spray - 1 spray in both nostrils daily as needed prior to HBO treatment for difficulty clearing ears Wound Treatment Wound #1 - Foot Wound Laterality: Dorsal, Left Cleanser: Soap and Water 1 x Per Day/30 Days Discharge Instructions: May shower and wash wound with dial antibacterial soap and water prior to dressing change. Cleanser: Wound Cleanser 1 x Per Day/30 Days Discharge Instructions: Cleanse the wound with wound cleanser prior to applying [name] clean dressing using gauze sponges, not tissue or cotton balls. Prim Dressing: MediHoney Gel, tube 1.5 (oz) 1 x Per Day/30 Days ary Discharge Instructions: IF SANTYL NOT AVAILABLE - Apply to wound bed as instructed Prim Dressing: Santyl Ointment 1 x Per Day/30 Days ary Discharge Instructions: Apply nickel thick amount to wound bed as instructed Secured With: Elastic Bandage 4 inch (ACE bandage) 1 x Per Day/30 Days Discharge Instructions: Secure with ACE bandage as directed. Secured With: [employer], 4.5x3.1 (in/yd) 1 x Per Day/30 Days Discharge Instructions: Secure with Kerlix as directed. Secured With: Transpore Surgical Tape, 2x10 (in/yd) 1 x Per Day/30 Days Discharge Instructions: Secure dressing with tape as directed. Wound #2 - Amputation Site - Toe Wound Laterality: Left Cleanser: Soap and Water 1 x Per Day/30 Days Discharge Instructions: May shower and wash wound with dial antibacterial soap and water prior to dressing change. Cleanser: Wound Cleanser 1 x Per Day/30 Days Discharge Instructions: Cleanse the wound with wound cleanser prior to applying [name] clean dressing using gauze sponges, not tissue or cotton balls. Prim Dressing: MediHoney Gel, tube 1.5 (oz) 1 x Per Day/30 Days ary Discharge Instructions: IF SANTYL NOT AVAILABLE - Apply to  wound bed as instructed Prim Dressing: Santyl Ointment 1 x Per Day/30 Days ary Discharge Instructions: Apply nickel thick amount to wound bed as instructed Secondary Dressing: ABD Pad, 5x9 1 x Per Day/30 Days Discharge Instructions: Apply over primary dressing as directed. Secondary Dressing: Woven Gauze Sponge, Non-Sterile 4x4 in 1 x Per Day/30 Days Discharge Instructions: Moisten gauze with saline and apply over primary dressing as directed. Secured With: Elastic Bandage 4 inch (ACE bandage) 1 x Per Day/30 Days Discharge Instructions: Secure with ACE bandage as directed. Secured With: [employer], 4.5x3.1 (in/yd) 1 x Per Day/30 Days Discharge Instructions: Secure with Kerlix as directed. Secured With: Transpore Surgical Tape, 2x10 (in/yd) 1 x Per Day/30 Days Discharge Instructions: Secure dressing with tape as directed. Patient Medications llergies: gadobutrol, Iodinated Contrast Media [name] Notifications Medication Indication Start End 07/22/2023 lidocaine DOSE topical 4 % cream - cream topical 07/22/2023 Santyl DOSE topical 250 unit/gram ointment - apply nickel-thick layer to all wound surfaces with daily  dressing changes GLYCEMIA INTERVENTIONS PROTOCOL PRE-HBO GLYCEMIA INTERVENTIONS ACTION INTERVENTION Morton, Gardiner [name] ([id number]) [id number] Page 7 of 15 Obtain pre-HBO capillary blood glucose (ensure 1 physician order is in chart). [name]. Notify HBO physician and await physician orders. 2 If result is 70 mg/dl or below: B. If the result meets the hospital definition of [name] critical result, follow hospital policy. [name]. Give patient an 8 ounce Glucerna Shake, an 8 ounce Ensure, or 8 ounces of [name] Glucerna/Ensure equivalent dietary supplement*. B. Wait 30 minutes. If result is 71 mg/dl to 696 mg/dl: C. Retest patients capillary blood glucose (CBG). D. If result greater than or equal to 110 mg/dl, proceed with HBO. If result less than 110 mg/dl,  notify HBO physician and consider holding HBO. If result is 131 mg/dl to 295 mg/dl: [name]. Proceed with HBO. [name]. Notify HBO physician and await physician orders. B. It is recommended to hold HBO and do If result is 250 mg/dl or greater: blood/urine ketone testing. C. If the result meets the hospital definition of [name] critical result, follow hospital policy. POST-HBO GLYCEMIA INTERVENTIONS ACTION INTERVENTION Obtain post HBO capillary blood glucose (ensure 1 physician order is in chart). [name]. Notify HBO physician and await physician orders. 2 If result is 70 mg/dl or below: B. If the result meets the hospital definition of [name] critical result, follow hospital policy. [name]. Give patient an 8 ounce Glucerna Shake, an 8 ounce Ensure, or 8 ounces of [name] Glucerna/Ensure equivalent dietary supplement*. B. Wait 15 minutes for symptoms of If result is 71 mg/dl to 284 mg/dl: hypoglycemia (i.e. nervousness, anxiety, sweating, chills, clamminess, irritability, confusion, tachycardia or dizziness). C. If patient asymptomatic, discharge patient. If patient symptomatic, repeat capillary blood glucose (CBG) and notify HBO physician. If result is 101 mg/dl to 132 mg/dl: [name]. Discharge patient. [name]. Notify HBO physician and await physician orders. B. It is recommended to do blood/urine ketone If result is 250 mg/dl or greater: testing. C. If the result meets the hospital definition of [name] critical result, follow hospital policy. *Juice or candies are NOT equivalent products. If patient refuses the Glucerna or Ensure, please consult the hospital dietitian for an appropriate substitute. Electronic Signature(s) Signed: 07/22/2023 12:51:55 PM By: Duanne Guess MD FACS Previous Signature: 07/22/2023 12:31:48 PM Version By: Duanne Guess MD FACS Previous Signature: 07/22/2023 11:57:20 AM Version By: Gelene Mink By: Duanne Guess on 07/22/2023  09:32:01 -------------------------------------------------------------------------------- Problem List Details Patient Name: Date of Service: [name] [name], Treylan [name]. 07/22/2023 8:45 [name] Medical Record Number: [id number] Patient Account Number: [id number] Date of Birth/Sex: Treating RN: 12-07-1950 (72 y.o. 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 of muscle 07/22/2023 No Yes L97.522 Non-pressure chronic ulcer of other part of left foot with fat layer exposed 07/22/2023 No Yes E11.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 07/22/2023 No Yes E11.621 Type 2 diabetes mellitus with foot ulcer 07/22/2023 No Yes I73.9 Peripheral vascular disease, unspecified 07/22/2023 No Yes I70.245 Atherosclerosis of native arteries of left leg with ulceration of other part of 07/22/2023 No Yes foot Inactive Problems Resolved Problems Electronic Signature(s) Signed: 07/22/2023 12:17:00 PM By: Duanne Guess  MD FACS Previous Signature: 07/22/2023 12:16:12 PM Version By: Duanne Guess MD FACS Previous Signature: 07/22/2023 8:34:11 AM Version By: Duanne Guess MD FACS Entered By: Duanne Guess on 07/22/2023 09:16:59 -------------------------------------------------------------------------------- Progress Note Details Patient Name: Date of Service: [name] [name], Yer [name]. 07/22/2023 8:45 [name] Medical Record Number: [id number] Patient Account Number: [id number] Date of Birth/Sex: Treating RN: 1950/10/25 (72  y.o. M) Primary Care Provider: Doreen Beam Other Clinician: Referring Provider: Treating Provider/Extender: Vonzella Nipple, Dhruv Weeks in Treatment: 0 Subjective Chief Complaint Information obtained from Patient Patients presents for treatment of open diabetic ulcers in the setting of severe peripheral vascular disease History of Present Illness (HPI) ADMISSION 07/22/2023 ***POST-REVASCULARIZATION ABI/DOPPLER:*** ABI: +--------+------------------+-----+----------+--------+ Right Rt Pressure (mmHg)IndexWaveform Comment  +--------+------------------+-----+----------+--------+ [id number]     +--------+------------------+-----+----------+--------+ PTA 91 0.64 monophasic  +--------+------------------+-----+----------+--------+ DP 97 0.68 monophasic  +--------+------------------+-----+----------+--------+ +--------+------------------+-----+----------+-------+ Cinnamon, Estell [name] ([id number]) [id number] Page 9 of 15 Left Lt Pressure (mmHg)IndexWaveform Comment +--------+------------------+-----+----------+-------+ [id number]     +--------+------------------+-----+----------+-------+ PTA 160 1.13 biphasic   +--------+------------------+-----+----------+-------+ DP 101 0.71 monophasic  +--------+------------------+-----+----------+-------+ LLE Duplex: +----------+--------+-----+---------------+--------+--------+ LEFT PSV cm/sRatioStenosis WaveformComments +----------+--------+-----+---------------+--------+--------+ CFA Distal204  50-74% stenosis   +----------+--------+-----+---------------+--------+--------+ SFA Prox 202  50-74% stenosis   +----------+--------+-----+---------------+--------+--------+ SFA Mid 152  30-49% stenosis   +----------+--------+-----+---------------+--------+--------+ Left Graft #1: mid SFA to posterior  tibial +--------------------+--------+---------------+--------+----------------+  PSV cm/sStenosis WaveformComments  +--------------------+--------+---------------+--------+----------------+ Inflow 152     +--------------------+--------+---------------+--------+----------------+ Proximal Anastomosis360 >70% stenosis    +--------------------+--------+---------------+--------+----------------+ Proximal Graft 180 50-70% stenosis low end of range +--------------------+--------+---------------+--------+----------------+ Mid Graft 136     +--------------------+--------+---------------+--------+----------------+ Distal Graft 141     +--------------------+--------+---------------+--------+----------------+ Distal Anastomosis 183 50-70% stenosis   +--------------------+--------+---------------+--------+----------------+ Outflow 224 50-74% stenosis   +--------------------+--------+---------------+--------+----------------+ This is [name] 72 year old type II diabetic (last hemoglobin A1c 6.7% on June 07, 2023) with severe peripheral vascular occlusive disease. 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 Preoperative MRI did not demonstrate evidence of osteomyelitis. Unfortunately, ABIs were not performed prior to that operation and postoperatively, ABIs were done that showed severe bilateral lower extremity arterial occlusive disease with minimal perfusion to the left lower leg. He was transferred to the vascular surgery service at [facility]. An arteriogram was done that showed occlusion of the left common iliac artery. This was stented, but additional inflow was necessary to provide better blood flow to the foot. He subsequently underwent [name] femoral to tibial artery bypass with saphenous vein graft on October 8. 1 month post bypass arterial studies are copied above. He continued to receive  wound care at [facility]. His last visit there was on 12 November. Their note describes the base of the ray amputation appearing viable with buds of granulation appearing. The surrounding digits appeared to be marginal with ischemic eschar formation. They have been doing Dakin's wet-to-dry dressings. He was referred to our center to be evaluated for hyperbaric oxygen therapy. Patient History Information obtained from Patient, Caregiver, Chart. Allergies gadobutrol (Severity: Severe, Reaction: hives, itching, swelling), Iodinated Contrast Media Family History Cancer - Father, Diabetes - Mother, Heart Disease - Paternal Grandparents, Hypertension - Father,Mother, Stroke - Mother, No family history of Hereditary Spherocytosis, Kidney Disease, Lung Disease, Seizures, Thyroid Problems, Tuberculosis. Social History Former smoker - quit 06/01/2023 - ended on 06/01/2023, Marital Status - Married, Alcohol Use - Rarely, Drug Use - No History, Caffeine Use - Moderate. Medical History Eyes Denies history of Cataracts, Glaucoma Ear/Nose/Mouth/Throat Denies history of Chronic sinus problems/congestion, Middle ear problems Hematologic/Lymphatic Patient has history of Anemia - normocytic Cardiovascular Patient has  history of Hypertension, Peripheral Arterial Disease Endocrine Patient has history of Type II Diabetes - Hem A1C 6.7% (06/07/23) Denies history of Type I Diabetes Genitourinary Denies history of End Stage Renal Disease Integumentary (Skin) Denies history of History of Burn Neurologic Patient has history of Neuropathy Oncologic Denies history of Received Chemotherapy, Received Radiation Psychiatric Patient has history of Confinement Anxiety Denies history of Anorexia/bulimia Patient is treated with Controlled Diet. Blood sugar is not tested. Riemann, Haakon [name] ([id number]) [phone].pdf Page 10 of 15 Hospitalization/Surgery History - left fem-pop [facility]. 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 Eyes Denies complaints or symptoms of Dry Eyes, Vision Changes, Glasses / Contacts. Ear/Nose/Mouth/Throat Denies complaints or symptoms of Chronic sinus problems or rhinitis. Respiratory Denies complaints or symptoms of Chronic or frequent coughs, Shortness of Breath. Gastrointestinal Denies complaints or symptoms of Frequent diarrhea, Nausea, Vomiting. Endocrine Denies complaints or symptoms of Heat/cold intolerance. Genitourinary Denies complaints or symptoms of Frequent urination. Integumentary (Skin) Complains or has symptoms of Wounds - left foot. Neurologic Complains or has symptoms of Numbness/parasthesias. Psychiatric Complains or has symptoms of Claustrophobia - mild. Objective Constitutional Hypertensive, asymptomatic. No acute distress. Vitals Time Taken: 8:51 AM, Height: 68 in, Source: Stated, Weight: 198 lbs, Source: Stated, BMI: 30.1, Temperature: 98.3 F, Pulse: 62 bpm, Respiratory Rate: 18 breaths/min, Blood Pressure: 154/71 mmHg. General Notes: pt does not check blood sugars at home Respiratory Normal work of breathing on room air. Clear to auscultation bilaterally. General Notes: 07/22/2023: There is [name] small ulcer on the dorsum of the patient's midfoot with some slough accumulation. He says this wound is quite tender. The amputation site has [name] hole in the center that probes to bone. There is slough accumulation on that surface with good granulation tissue underneath. The patient's great toe has areas of ischemic gangrene. The  muscle is involved underneath [name] layer of black eschar. Integumentary (Hair, Skin) Wound #1 status is Open. Original cause of wound was Gradually Appeared. The date acquired was: 06/02/2023. The wound is located on the Left,Dorsal Foot. The wound measures 1cm length x 0.6cm width x 0.1cm depth; 0.471cm^2 area and 0.047cm^3 volume. There is Fat Layer (Subcutaneous Tissue) exposed. There is no tunneling or undermining noted. There is [name] medium amount of serous drainage noted. The wound margin is flat and intact. There is no granulation within the wound bed. There is [name] large (67-100%) amount of necrotic tissue within the wound bed including Adherent Slough. The periwound skin appearance had no abnormalities noted for texture. The periwound skin appearance had no abnormalities noted for color. The periwound skin appearance exhibited: Dry/Scaly. Periwound temperature was noted as No Abnormality. The periwound has tenderness on palpation. Wound #2 status is Open. Original cause of wound was Surgical Injury. The date acquired was: 06/02/2023. The wound is located on the Left Amputation Site - T The wound measures 5cm length x 2.5cm width x 0.5cm depth; 9.817cm^2 area and 4.909cm^3 volume. There is bone and Fat Layer (Subcutaneous oe. Tissue) exposed. There is no tunneling or undermining noted. There is [name] medium amount of serosanguineous drainage noted. The wound margin is flat and intact. There is small (1-33%) red granulation within the wound  bed. There is [name] large (67-100%) amount of necrotic tissue within the wound bed including Adherent Slough. The periwound skin appearance had no abnormalities noted for texture. The periwound skin appearance had no abnormalities noted for color. The periwound skin appearance exhibited: Dry/Scaly. Periwound temperature was noted as No Abnormality. The periwound has tenderness on palpation. Wound #3 status is Open. Original cause of wound was Gradually Appeared. The date  acquired was: 06/07/2023. The wound is located on the Left T Great. The oe wound measures 7.5cm length x 3cm width x 0.1cm depth; 17.671cm^2 area and 1.767cm^3 volume. There is Fat Layer (Subcutaneous Tissue) exposed. There is no tunneling or undermining noted. There is [name] small amount of serosanguineous drainage noted. The wound margin is flat and intact. There is no granulation within the wound bed. There is [name] large (67-100%) amount of necrotic tissue within the wound bed including Eschar. The periwound skin appearance had no abnormalities noted for texture. The periwound skin appearance had no abnormalities noted for color. The periwound skin appearance exhibited: Dry/Scaly. Periwound temperature was noted as No Abnormality. The periwound has tenderness on palpation. 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 There was [name] Excisional Skin/Subcutaneous Tissue Debridement with [name] total area of 0.47 sq cm performed by Duanne Guess, MD. With the following instrument(s): Curette, Forceps, and Scissors to remove Non-Viable tissue/material. Material removed includes Subcutaneous Tissue and Slough and after achieving pain control using  Lidocaine 4% Topical Solution. No specimens were taken. [name] time out was conducted at 09:45, prior to the start of the procedure. [name] Minimum amount of bleeding was controlled with Pressure. The procedure was tolerated well. Post Debridement Measurements: 1cm length x 0.6cm width x 0.1cm depth; 0.047cm^3 volume. Character of Wound/Ulcer Post Debridement is improved. Severity of Tissue Post Debridement is: Fat layer exposed. Post procedure Diagnosis Wound #1: Same as Pre-Procedure Wound #2 Pre-procedure diagnosis of Wound #2 is [name] Diabetic Wound/Ulcer of the Lower Extremity located on the Left Amputation Site - T .Severity of Tissue Pre oe Debridement is: Bone involvement without necrosis. There was [name] Excisional Skin/Subcutaneous Tissue Debridement with [name] total area of 9.81 sq cm performed by Duanne Guess, MD. With the following instrument(s): Curette to remove Viable and Non-Viable tissue/material. Material removed includes Subcutaneous Tissue and Slough and after achieving pain control using Lidocaine 4% T opical Solution. No specimens were taken. [name] time out was conducted at 09:45, prior to the start of the procedure. [name] Minimum amount of bleeding was controlled with Pressure. The procedure was tolerated well. Post Debridement Measurements: 5cm length x 2.5cm width x 0.5cm depth; 4.909cm^3 volume. Character of Wound/Ulcer Post Debridement requires further debridement. Severity of Tissue Post Debridement is: Bone involvement without necrosis. Post procedure Diagnosis Wound #2: Same as Pre-Procedure Wound #3 Pre-procedure diagnosis of Wound #3 is [name] Diabetic Wound/Ulcer of the Lower Extremity  located on the Left T Great .Severity of Tissue Pre Debridement is: oe Fat layer exposed. There was [name] Excisional Skin/Subcutaneous Tissue/Muscle Debridement with [name] total area of 17.66 sq cm performed by Duanne Guess, MD. With the following instrument(s): Curette, Forceps, and Scissors to remove Viable and  Non-Viable tissue/material. Material removed includes Muscle, Eschar, Subcutaneous Tissue, and Slough after achieving pain control using Lidocaine 4% T opical Solution. No specimens were taken. [name] time out was conducted at 09:45, prior to the start of the procedure. [name] Minimum amount of bleeding was controlled with Pressure. The procedure was tolerated well. Post Debridement Measurements: 7.5cm length x 3cm width x 0.3cm depth; 5.301cm^3 volume. Character of Wound/Ulcer Post Debridement is improved. Severity of Tissue Post Debridement is: Necrosis of muscle. Post procedure Diagnosis Wound #3: Same as Pre-Procedure Plan Follow-up Appointments: Return Appointment in 1 week. - Dr. Lady Gary - room 1 Anesthetic: (In clinic) Topical Lidocaine 4% applied to wound bed Bathing/ Shower/ Hygiene: May shower and wash wound with soap and water. Edema Control - Orders / Instructions: Elevate legs to the level of the heart or above for 30 minutes daily and/or when sitting for 3-4 times [name] day throughout the day. Avoid standing for long periods of time. Additional Orders / Instructions: Follow Nutritious Diet - 70-100 grams of protein [name] day - can supplement with protein shakes ensure/premier vitamin C start 500 mg once [name] day and if tolerated then take twice [name] day zinc 30-50 mg once [name] day Home Health: New wound care orders this week; continue Home Health for wound care. May utilize formulary equivalent dressing for wound treatment orders unless otherwise specified. Dressing changes to be completed by Home Health on Monday / Wednesday / Friday except when patient has scheduled visit at Wound Care Center. - ONLY USE MEDIHONEY IF SANYL IS NOT AVAILABLE Other Home Health Orders/Instructions: - [facility] Hyperbaric Oxygen Therapy: Evaluate for HBO Therapy Indication: - Wagner 4 diabetic foot ulcer If appropriate for treatment, begin HBOT per protocol: 2.5 ATA for 90 Minutes with 2 Five (5) Minute Air Breaks T  Number of Treatments: - 40 otal One treatments per day (delivered Monday through Friday unless otherwise specified in Special Instructions below): Finger stick Blood Glucose Pre- and Post- HBOT Treatment. Follow Hyperbaric Oxygen Glycemia Protocol Give two 4oz orange juices in addition to Glucerna when the glycemic protocol is used. Afrin (Oxymetazoline HCL) 0.05% nasal spray - 1 spray in both nostrils daily as needed prior to HBO treatment for difficulty clearing ears The following medication(s) was prescribed: lidocaine topical 4 % cream cream topical was prescribed at facility Santyl topical 250 unit/gram ointment apply nickel-thick layer to all wound surfaces with daily dressing changes starting 07/22/2023 WOUND #1: - Foot Wound Laterality: Dorsal, Left Cleanser: Soap and Water 1 x Per Day/30 Days Discharge Instructions: May shower and wash wound with dial antibacterial soap and water prior to dressing change. Cleanser: Wound Cleanser 1 x Per Day/30 Days Discharge Instructions: Cleanse the wound with wound cleanser prior to applying [name] clean dressing using gauze sponges, not tissue or cotton balls. Prim Dressing: MediHoney Gel, tube 1.5 (oz) 1 x Per Day/30 Days ary Geeting, Rondell [name] ([id number]) [phone].pdf Page 12 of 15 Discharge Instructions: IF SANTYL NOT AVAILABLE - Apply to wound bed as instructed Prim Dressing: Santyl Ointment 1 x Per Day/30 Days ary Discharge Instructions: Apply nickel thick amount to wound bed as instructed Secured With: Elastic Bandage 4 inch (ACE bandage) 1 x Per Day/30 Days Discharge Instructions: Secure  with ACE bandage as directed. Secured With: [employer], 4.5x3.1 (in/yd) 1 x Per Day/30 Days Discharge Instructions: Secure with Kerlix as directed. Secured With: Transpore Surgical T ape, 2x10 (in/yd) 1 x Per Day/30 Days Discharge Instructions: Secure dressing with tape as directed. WOUND #2: - Amputation Site - T oe Wound  Laterality: Left Cleanser: Soap and Water 1 x Per Day/30 Days Discharge Instructions: May shower and wash wound with dial antibacterial soap and water prior to dressing change. Cleanser: Wound Cleanser 1 x Per Day/30 Days Discharge Instructions: Cleanse the wound with wound cleanser prior to applying [name] clean dressing using gauze sponges, not tissue or cotton balls. Prim Dressing: MediHoney Gel, tube 1.5 (oz) 1 x Per Day/30 Days ary Discharge Instructions: IF SANTYL NOT AVAILABLE - Apply to wound bed as instructed Prim Dressing: Santyl Ointment 1 x Per Day/30 Days ary Discharge Instructions: Apply nickel thick amount to wound bed as instructed Secondary Dressing: ABD Pad, 5x9 1 x Per Day/30 Days Discharge Instructions: Apply over primary dressing as directed. Secondary Dressing: Woven Gauze Sponge, Non-Sterile 4x4 in 1 x Per Day/30 Days Discharge Instructions: Moisten gauze with saline and apply over primary dressing as directed. Secured With: Elastic Bandage 4 inch (ACE bandage) 1 x Per Day/30 Days Discharge Instructions: Secure with ACE bandage as directed. Secured With: [employer], 4.5x3.1 (in/yd) 1 x Per Day/30 Days Discharge Instructions: Secure with Kerlix as directed. Secured With: Transpore Surgical T ape, 2x10 (in/yd) 1 x Per Day/30 Days Discharge Instructions: Secure dressing with tape as directed. 07/22/2023: This is [name] 72 year old well-controlled type II diabetic with severe peripheral vascular disease, status post revascularization, presenting to clinic for further evaluation and management of [name] Wagner 4 ulcer on his great toe and treatment of the amputation site where he underwent [name] second toe ray amputation. There is [name] small ulcer on the dorsum of the patient's midfoot with some slough accumulation. He says this wound is quite tender. The amputation site has [name] hole in the center that probes to bone. There is slough accumulation on that surface with good granulation  tissue underneath. The patient's great toe has areas of ischemic gangrene. The muscle is involved underneath [name] layer of black eschar. I used [name] curette to debride slough and subcutaneous tissue from the amputation site and the dorsal foot wound. I used scissors and forceps to trim away the black eschar on his great toe and then [name] curette to debride slough, subcutaneous tissue, and muscle from the great toe. All of his wounds would benefit from further enzymatic debridement so I have prescribed Santyl which they will apply, cover with saline-moistened gauze and wrap with Kerlix and an Ace bandage or Coban. We have discussed hyperbaric oxygen therapy with him and he would like to proceed. 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 He does need [name] chest x-ray, but his EKG is good. His last hemoglobin was October 14, and was low at 7.3. I do not see that he recieved any intervention; we will need to recheck this, as well. Follow-up in 1 week. Electronic Signature(s) Signed: 07/22/2023 12:43:02 PM By: Duanne Guess MD FACS Previous Signature: 07/22/2023 12:34:55 PM Version By: Duanne Guess MD FACS Entered By: Duanne Guess on 07/22/2023 09:43:02 -------------------------------------------------------------------------------- HxROS Details Patient Name: Date of Service: [name] [name], [name] [name]. 07/22/2023 8:45 [name] Medical Record Number: [id number] Patient Account Number: [id number] Date of Birth/Sex: Treating RN: Oct 17, 1950 (72 y.o. [name] Primary Care Provider: Doreen Beam Other Clinician: Referring Provider:  Treating Provider/Extender: Vonzella Nipple, Dhruv Weeks in Treatment: 0 Information Obtained From Patient Caregiver Chart Constitutional Symptoms (General Health) Complaints and Symptoms: Negative for: Fatigue; Fever; Chills; Marked Weight Change Eyes Complaints and Symptoms: Negative for: Dry Eyes; Vision Changes; Glasses / Contacts Priebe, Kethan [name] ([id number])  [id number] Page 13 of 15 Medical History: Negative for: Cataracts; Glaucoma Ear/Nose/Mouth/Throat Complaints and Symptoms: Negative for: Chronic sinus problems or rhinitis Medical History: Negative for: Chronic sinus problems/congestion; Middle ear problems Respiratory Complaints and Symptoms: Negative for: Chronic or frequent coughs; Shortness of Breath Gastrointestinal Complaints and Symptoms: Negative for: Frequent diarrhea; Nausea; Vomiting Endocrine Complaints and Symptoms: Negative for: Heat/cold intolerance Medical History: Positive for: Type II Diabetes - Hem A1C 6.7% (06/07/23) Negative for: Type I Diabetes Time with diabetes: since 2021 Treated with: Diet Blood sugar tested every day: No Genitourinary Complaints and Symptoms: Negative for: Frequent urination Medical History: Negative for: End Stage Renal Disease Integumentary (Skin) Complaints and Symptoms: Positive for: Wounds - left foot Medical History: Negative for: History of Burn Past Medical History Notes: Gangrene Left foot (toes) Neurologic Complaints and Symptoms: Positive for: Numbness/parasthesias Medical History: Positive for: Neuropathy Psychiatric Complaints and Symptoms: Positive for: Claustrophobia - mild Medical History: Positive for: Confinement Anxiety Negative for: Anorexia/bulimia Hematologic/Lymphatic Medical History: Positive for: Anemia - normocytic Cardiovascular Medical History: Positive for: Hypertension; Peripheral Arterial Disease Past Medical History Notes: Hx: 07/15/23 ABI R 0.68; R TBI absent 07/15/23 ABI L 1.13;L TBI bandage 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 hyperlipidemia [name] [name], [name] [name] ([id number]) [id number] Page 14 of 15 Immunological Musculoskeletal Medical History: Past Medical History Notes: Lumbar stenosis with neurogenic claudication Oncologic Medical  History: Negative for: Received Chemotherapy; Received Radiation Immunizations Pneumococcal Vaccine: Received Pneumococcal Vaccination: Yes Received Pneumococcal Vaccination On or After 60th Birthday: Yes Implantable Devices No devices added Hospitalization / Surgery History Type of Hospitalization/Surgery left fem-pop [facility] Family and Social History Cancer: Yes - Father; Diabetes: Yes - Mother; Heart Disease: Yes - Paternal Grandparents; Hereditary Spherocytosis: No; Hypertension: Yes - Father,Mother; Kidney Disease: No; Lung Disease: No; Seizures: No; Stroke: Yes - Mother; Thyroid Problems: No; Tuberculosis: No; Former smoker - quit 06/01/2023 - ended on 06/01/2023; Marital Status - Married; Alcohol Use: Rarely; Drug Use: No History; Caffeine Use: Moderate; Financial Concerns: No; Food, Clothing or Shelter Needs: No; Support System Lacking: No; Transportation Concerns: Yes - concerned about transportation from [employer]) Signed: 07/22/2023 12:51:55 PM By: Duanne Guess MD FACS Signed: 07/22/2023 1:08:41 PM By: Karie Schwalbe RN Entered By: Karie Schwalbe on 07/22/2023 06:32:29 -------------------------------------------------------------------------------- SuperBill Details Patient Name: Date of Service: [name] [name], Heitor [name]. 07/22/2023 Medical Record Number: [id number] Patient Account Number: [id number] Date of Birth/Sex: Treating RN: Jul 10, 1951 (72 y.o. [name] [name] Primary Care Provider: Doreen Beam Other Clinician: Referring Provider: Treating Provider/Extender: Teofilo Pod Weeks in Treatment: 0 Diagnosis Coding ICD-10 Codes Code Description [phone] Non-pressure chronic ulcer of other part of left foot with bone involvement without evidence of necrosis L97.523 Non-pressure chronic ulcer of other part of left foot with necrosis of muscle L97.522 Non-pressure chronic ulcer of other part of left foot with fat layer exposed E11.52 Type 2  diabetes mellitus with diabetic peripheral angiopathy with gangrene E11.621 Type 2 diabetes mellitus with foot ulcer I73.9 Peripheral vascular disease, unspecified I70.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 Facility Procedures : CPT4 Code: [id number] Description: 99213 - WOUND CARE VISIT-LEV 3 EST PT Modifier: 25 Quantity: 1 :  Echevarria, CPT4 Code: [id number] Corney [name] [phone] Description: 11042 - DEB SUBQ TISSUE 20 SQ CM/< 425) [phone] Modifier: [id number] Quantity: 1 7.pdf Page 15 of 15 : CPT4 Code: I Description: CD-10 Diagnosis Description 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 L97.522 Non-pressure chronic ulcer of other part of left foot with fat layer exposed Modifier: t evidence of necr Quantity: osis : CPT4 Code: [id number] 1 I Description: 1043 - DEB MUSC/FASCIA 20 SQ CM/< CD-10 Diagnosis Description L97.523 Non-pressure chronic ulcer of other part of left foot with necrosis of muscle Modifier: Quantity: 1 Physician Procedures : CPT4 Code Description Modifier [id number] 99204 - WC PHYS LEVEL 4 - NEW PT 25 ICD-10 Diagnosis Description 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t evidence of necro 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 Quantity: 1 sis : [id number] 11042 - WC PHYS SUBQ TISS 20 SQ CM ICD-10 Diagnosis Description 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

## 2023-07-22 NOTE — Progress Notes (Signed)
[name] [name] ([id number]) [phone].pdf Page 1 of 13 Visit Report for 07/22/2023 Allergy List Details Patient Name: Date of Service: [name] [name], [name] [name] 07/22/2023 8:45 [name] Medical Record Number: [id number] Patient Account Number: [id number] Date of Birth/Sex: Treating RN: [birth year] (72 y.o. [name] Primary Care Cigi Bega: Doreen Beam Other Clinician: Referring Brigida Scotti: Treating Karlyn Glasco/Extender: Vonzella Nipple, Dhruv Weeks in Treatment: 0 Allergies Active Allergies gadobutrol Reaction: hives, itching, swelling Severity: Severe Iodinated Contrast Media Allergy Notes Electronic Signature(s) Signed: 07/22/2023 1:02:24 PM By: Zenaida Deed RN, BSN Entered By: Zenaida Deed on 07/22/2023 05:54:29 -------------------------------------------------------------------------------- Arrival Information Details Patient Name: Date of Service: [name] [name], [name] [name]. 07/22/2023 8:45 [name] Medical Record Number: [id number] Patient Account Number: [id number] Date of Birth/Sex: Treating RN: 1951/07/26 (72 y.o. Damaris Schooner Primary Care Nilda Keathley: Doreen Beam Other Clinician: Referring Jaques Mineer: Treating Aribelle Mccosh/Extender: Teofilo Pod Weeks in Treatment: 0 Visit Information Patient Arrived: Walker Arrival Time: 08:51 Accompanied By: spouse Transfer Assistance: None Patient Identification Verified: Yes Secondary Verification Process Completed: Yes Patient Requires Transmission-Based Precautions: No Patient Has Alerts: Yes Patient Alerts: ABI R 0.68 (07/15/23) ABI L 1.13 (07/15/23) Electronic Signature(s) Signed: 07/22/2023 1:08:41 PM By: Karie Schwalbe RN Entered By: Karie Schwalbe on 07/22/2023 06:53:45 Cobaugh, Rayen [name] ([id number]) [id number] Page 2 of 13 -------------------------------------------------------------------------------- Clinic Level of Care Assessment Details Patient Name: Date of  Service: [name] [name], [name] [name] 07/22/2023 8:45 [name] Medical Record Number: [id number] Patient Account Number: [id number] Date of Birth/Sex: Treating RN: Mar 19, 1951 (72 y.o. Marlan Palau Primary Care Jaidon Ellery: Doreen Beam Other Clinician: Referring Dawayne Ohair: Treating [name] Koehl/Extender: Teofilo Pod Weeks in Treatment: 0 Clinic Level of Care Assessment Items TOOL 1 Quantity Score X- 1 0 Use when EandM and Procedure is performed on INITIAL visit ASSESSMENTS - Nursing Assessment / Reassessment X- 1 20 General Physical Exam (combine w/ comprehensive assessment (listed just below) when performed on new pt. evals) X- 1 25 Comprehensive Assessment (HX, ROS, Risk Assessments, Wounds Hx, etc.) ASSESSMENTS - Wound and Skin Assessment / Reassessment []  - 0 Dermatologic / Skin Assessment (not related to wound area) ASSESSMENTS - Ostomy and/or Continence Assessment and Care []  - 0 Incontinence Assessment and Management []  - 0 Ostomy Care Assessment and Management (repouching, etc.) PROCESS - Coordination of Care X - Simple Patient / Family Education for ongoing care 1 15 []  - 0 Complex (extensive) Patient / Family Education for ongoing care X- 1 10 Staff obtains Chiropractor, Records, T Results / Process Orders est []  - 0 Staff telephones HHA, Nursing Homes / Clarify orders / etc []  - 0 Routine Transfer to another Facility (non-emergent condition) []  - 0 Routine Hospital Admission (non-emergent condition) X- 1 15 New Admissions / Manufacturing engineer / Ordering NPWT Apligraf, etc. , []  - 0 Emergency Hospital Admission (emergent condition) PROCESS - Special Needs []  - 0 Pediatric / Minor Patient Management []  - 0 Isolation Patient Management []  - 0 Hearing / Language / Visual special needs []  - 0 Assessment of Community assistance (transportation, D/C planning, etc.) []  - 0 Additional assistance / Altered mentation []  - 0 Support Surface(s) Assessment (bed,  cushion, seat, etc.) INTERVENTIONS - Miscellaneous []  - 0 External ear exam []  - 0 Patient Transfer (multiple staff / Nurse, adult / Similar devices) []  - 0 Simple Staple / Suture removal (25 or less) []  - 0 Complex Staple / Suture removal (26 or more) []  - 0 Hypo/Hyperglycemic Management (do not check if billed separately) []  - 0 Ankle /  Brachial Index (ABI) - do not check if billed separately Has the patient been seen at the hospital within the last three years: Yes Total Score: 85 Level Of Care: New/Established - Level 3 Electronic Signature(s) Signed: 07/22/2023 11:57:20 AM By: Samuella Bruin Wollin, Signed: 07/22/2023 11:57:20 AM By: Webb Silversmith ([id number]) [id number] Page 3 of 13 aylor Entered By: Samuella Bruin on 07/22/2023 06:48:40 -------------------------------------------------------------------------------- Encounter Discharge Information Details Patient Name: Date of Service: [name] [name], [name] [name] 07/22/2023 8:45 [name] Medical Record Number: [id number] Patient Account Number: [id number] Date of Birth/Sex: Treating RN: 22-Dec-1950 (72 y.o. [name] Primary Care Adarrius Graeff: Doreen Beam Other Clinician: Referring Tavio Biegel: Treating Aviyana Sonntag/Extender: Teofilo Pod Weeks in Treatment: 0 Encounter Discharge Information Items Post Procedure Vitals Discharge Condition: Stable Temperature (F): 98.3 Ambulatory Status: Walker Pulse (bpm): 62 Discharge Destination: Home Respiratory Rate (breaths/min): 18 Transportation: Private Auto Blood Pressure (mmHg): 154/71 Accompanied By: spouse Schedule Follow-up Appointment: Yes Clinical Summary of Care: Patient Declined Electronic Signature(s) Signed: 07/22/2023 1:08:41 PM By: Karie Schwalbe RN Entered By: Karie Schwalbe on 07/22/2023 07:46:21 -------------------------------------------------------------------------------- Lower Extremity Assessment  Details Patient Name: Date of Service: [name] [name], [name] [name]. 07/22/2023 8:45 [name] Medical Record Number: [id number] Patient Account Number: [id number] Date of Birth/Sex: Treating RN: [birth year] (72 y.o. Damaris Schooner Primary Care Atreus Hasz: Doreen Beam Other Clinician: Referring Eban Weick: Treating Danial Sisley/Extender: Vonzella Nipple, Dhruv Weeks in Treatment: 0 Edema Assessment Assessed: [Left: No] [Right: No] Edema: [Left: Ye] [Right: s] Calf Left: Right: Point of Measurement: From Medial Instep 41 cm Ankle Left: Right: Point of Measurement: From Medial Instep 30.5 cm Vascular Assessment Pulses: Dorsalis Pedis Palpable: [Left:No] Extremity colors, hair growth, and conditions: Extremity Color: [Left:Normal] Hair Growth on Extremity: [Left:No] Temperature of Extremity: [Left:Warm] Capillary Refill: [Left:< 3 seconds] Herne, Sage [name] ([id number]) [Right:[id number] Page 4 of 13] Toe Nail Assessment Left: [Left:Right:] Thick: [Left:Yes] Discolored: [Left:Yes] Deformed: [Left:No Yes] Electronic Signature(s) Signed: 07/22/2023 1:02:24 PM By: Zenaida Deed RN, BSN Entered By: Zenaida Deed on 07/22/2023 06:16:21 -------------------------------------------------------------------------------- Multi Wound Chart Details Patient Name: Date of Service: [name] [name], [name] [name]. 07/22/2023 8:45 [name] Medical Record Number: [id number] Patient Account Number: [id number] Date of Birth/Sex: Treating RN: 1951-06-30 (72 y.o. M) Primary Care Dilyn Osoria: Doreen Beam Other Clinician: Referring Demara Lover: Treating Shauntae Reitman/Extender: Vonzella Nipple, Dhruv Weeks in Treatment: 0 Vital Signs Height(in): 68 Pulse(bpm): 62 Weight(lbs): 198 Blood Pressure(mmHg): 154/71 Body Mass Index(BMI): 30.1 Temperature(F): 98.3 Respiratory Rate(breaths/min): 18 [1:Photos:] [N/[name]:N/[name]] Left, Dorsal Foot Left Amputation Site - T oe N/[name] Wound Location: Gradually Appeared  Surgical Injury N/[name] Wounding Event: Diabetic Wound/Ulcer of the Lower Diabetic Wound/Ulcer of the Lower N/[name] Primary Etiology: Extremity Extremity Anemia, Hypertension, Peripheral Anemia, Hypertension, Peripheral N/[name] Comorbid History: Arterial Disease, Type II Diabetes, Arterial Disease, Type II Diabetes, Neuropathy, Confinement Anxiety Neuropathy, Confinement Anxiety 06/02/2023 06/02/2023 N/[name] Date Acquired: 0 0 N/[name] Weeks of Treatment: Open Open N/[name] Wound Status: No No N/[name] Wound Recurrence: 1x0.6x0.1 7.5x5.5x0.5 N/[name] Measurements L x W x D (cm) 0.471 32.398 N/[name] [name] (cm) : rea 0.047 16.199 N/[name] Volume (cm) : Grade 2 Grade 4 N/[name] Classification: N/[name] Other N/[name] Wagner Verification: Medium Medium N/[name] Exudate [name] mount: Serous Serosanguineous N/[name] Exudate Type: amber red, brown N/[name] Exudate Color: Flat and Intact Distinct, outline attached N/[name] Wound Margin: None Present (0%) Small (1-33%) N/[name] Granulation [name] mount: N/[name] Red N/[name] Granulation Quality: Large (67-100%) Large (67-100%) N/[name] Necrotic [name] mount: Adherent Slough Eschar, Adherent Slough N/[name] Necrotic Tissue: Fat Layer (Subcutaneous Tissue): Yes Fat Layer (  Subcutaneous Tissue): Yes N/[name] Exposed Structures: Fascia: No Bone: Yes Tendon: No Fascia: No Muscle: No Tendon: No Joint: No Muscle: No Bone: No Joint: No Small (1-33%) Small (1-33%) N/[name] Epithelialization: Frisby, Joden [name] ([id number]) [id number] Page 5 of 13 Debridement - Excisional Debridement - Excisional N/[name] Debridement: 09:45 09:45 N/[name] Pre-procedure Verification/Time Out Taken: Lidocaine 4% Topical Solution Lidocaine 4% Topical Solution N/[name] Pain Control: Subcutaneous, Slough Necrotic/Eschar, Muscle, N/[name] Tissue Debrided: Subcutaneous, Slough Skin/Subcutaneous Tissue Skin/Subcutaneous Tissue/Muscle N/[name] Level: 0.47 32.38 N/[name] Debridement [name] (sq cm): rea Curette, Forceps, Scissors Curette, Forceps, Scissors N/[name] Instrument: Minimum  Minimum N/[name] Bleeding: Pressure Pressure N/[name] Hemostasis [name] chieved: Procedure was tolerated well Procedure was tolerated well N/[name] Debridement Treatment Response: 1x0.6x0.1 7.5x5.5x0.5 N/[name] Post Debridement Measurements L x W x D (cm) 0.047 16.199 N/[name] Post Debridement Volume: (cm) No Abnormalities Noted No Abnormalities Noted N/[name] Periwound Skin Texture: Dry/Scaly: Yes Dry/Scaly: Yes N/[name] Periwound Skin Moisture: No Abnormalities Noted No Abnormalities Noted N/[name] Periwound Skin Color: No Abnormality No Abnormality N/[name] Temperature: Yes Yes N/[name] Tenderness on Palpation: Debridement Debridement N/[name] Procedures Performed: Treatment Notes Wound #1 (Foot) Wound Laterality: Dorsal, Left Cleanser Soap and Water Discharge Instruction: May shower and wash wound with dial antibacterial soap and water prior to dressing change. Wound Cleanser Discharge Instruction: Cleanse the wound with wound cleanser prior to applying [name] clean dressing using gauze sponges, not tissue or cotton balls. Peri-Wound Care Topical Primary Dressing MediHoney Gel, tube 1.5 (oz) Discharge Instruction: IF SANTYL NOT AVAILABLE - Apply to wound bed as instructed Santyl Ointment Discharge Instruction: Apply nickel thick amount to wound bed as instructed Secondary Dressing ABD Pad, 5x9 Discharge Instruction: Apply over primary dressing as directed. Woven Gauze Sponge, Non-Sterile 4x4 in Discharge Instruction: Moisten gauze with saline and apply over primary dressing as directed. Secured With Elastic Bandage 4 inch (ACE bandage) Discharge Instruction: Secure with ACE bandage as directed. Kerlix Roll Sterile, 4.5x3.1 (in/yd) Discharge Instruction: Secure with Kerlix as directed. Transpore Surgical Tape, 2x10 (in/yd) Discharge Instruction: Secure dressing with tape as directed. Compression Wrap Compression Stockings Add-Ons Wound #2 (Amputation Site - Toe) Wound Laterality: Left Cleanser Soap and Water Discharge  Instruction: May shower and wash wound with dial antibacterial soap and water prior to dressing change. Wound Cleanser Discharge Instruction: Cleanse the wound with wound cleanser prior to applying [name] clean dressing using gauze sponges, not tissue or cotton balls. Peri-Wound Care Topical Primary Dressing MediHoney Gel, tube 1.5 (oz) [name] [name], [name] [name] ([id number]) [id number] Page 6 of 13 Discharge Instruction: IF SANTYL NOT AVAILABLE - Apply to wound bed as instructed Santyl Ointment Discharge Instruction: Apply nickel thick amount to wound bed as instructed Secondary Dressing ABD Pad, 5x9 Discharge Instruction: Apply over primary dressing as directed. Woven Gauze Sponge, Non-Sterile 4x4 in Discharge Instruction: Moisten gauze with saline and apply over primary dressing as directed. Secured With Elastic Bandage 4 inch (ACE bandage) Discharge Instruction: Secure with ACE bandage as directed. Kerlix Roll Sterile, 4.5x3.1 (in/yd) Discharge Instruction: Secure with Kerlix as directed. Transpore Surgical Tape, 2x10 (in/yd) Discharge Instruction: Secure dressing with tape as directed. Compression Wrap Compression Stockings Add-Ons Electronic Signature(s) Signed: 07/22/2023 12:17:24 PM By: Duanne Guess MD FACS Entered By: Duanne Guess on 07/22/2023 09:17:24 -------------------------------------------------------------------------------- Multi-Disciplinary Care Plan Details Patient Name: Date of Service: [name] [name], Camrin [name]. 07/22/2023 8:45 [name] Medical Record Number: [id number] Patient Account Number: [id number] Date of Birth/Sex: Treating RN: August 27, 1951 (72 y.o. 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 and treatment procedures Discomfort related to temperature and humidity changes inside hyperbaric chamber Potential for barotraumas to ears, sinuses, teeth, and lungs or cerebral gas embolism related to changes in atmospheric pressure inside hyperbaric oxygen chamber Potential for oxygen toxicity seizures related to delivery of 100% oxygen at an increased atmospheric pressure Potential for pulmonary oxygen toxicity related to delivery of 100% oxygen at an increased atmospheric pressure Goals: Barotrauma will be prevented during HBO2 Date Initiated: 07/22/2023 T arget Resolution Date: 09/09/2023 Goal Status: Active Patient and/or family will be able to state/discuss factors appropriate to the management of their disease process during treatment Date Initiated: 07/22/2023 T arget Resolution Date: 09/09/2023 Goal Status: Active Patient will tolerate the hyperbaric oxygen therapy treatment Date Initiated: 07/22/2023 T arget Resolution Date: 09/10/2023 Goal Status: Active Patient will tolerate the internal climate of the chamber Date Initiated: 07/22/2023 T arget Resolution Date: 09/10/2023 [name] [name], [name] [name] ([id number]) [id number] Page 7 of 13 Goal Status: Active Patient/caregiver will verbalize understanding of HBO goals, rationale, procedures and potential hazards Date Initiated: 07/22/2023 Target Resolution Date: 09/09/2023 Goal Status: Active Signs and symptoms of pulmonary oxygen toxicity will be recognized and promptly addressed Date Initiated: 07/22/2023 Target Resolution Date: 09/09/2023 Goal Status: Active Interventions: Administer [name] five (5) minute air break for patient if signs and symptoms of seizure appear and notify the hyperbaric physician Administer decongestants, per physician orders, prior to HBO2 Administer the correct therapeutic gas delivery based on the patients needs and  limitations, per physician order Assess and provide for patients comfort related to the hyperbaric environment and equalization of middle ear Assess for signs and symptoms related to adverse events, including but not limited to confinement anxiety, pneumothorax, oxygen toxicity and baurotrauma Assess patient for any history of confinement anxiety Assess patient's knowledge and expectations regarding hyperbaric medicine and provide education related to the hyperbaric environment, goals of treatment and prevention of adverse events Implement protocols to decrease risk of pneumothorax in high risk patients Notes: Necrotic Tissue Nursing Diagnoses: Impaired tissue integrity related to necrotic/devitalized tissue Knowledge deficit related to management of necrotic/devitalized tissue Goals: Necrotic/devitalized tissue will be minimized in the wound bed Date Initiated: 07/22/2023 Target Resolution Date: 09/09/2023 Goal Status: Active Patient/caregiver will verbalize understanding of reason and process for debridement of necrotic tissue Date Initiated: 07/22/2023 Target Resolution Date: 09/09/2023 Goal Status: Active Interventions: Assess patient pain level pre-, during and post procedure and prior to discharge Provide education on necrotic tissue and debridement process Treatment Activities: Apply topical anesthetic as ordered : 07/22/2023 Notes: Wound/Skin Impairment Nursing Diagnoses: Impaired tissue integrity Knowledge deficit related to ulceration/compromised skin integrity Goals: Patient/caregiver will verbalize understanding of skin care regimen Date Initiated: 07/22/2023 Target Resolution Date: 09/09/2023 Goal Status: Active Interventions: Assess ulceration(s) every visit Treatment Activities: Skin care regimen initiated : 07/22/2023 Topical wound management initiated : 07/22/2023 Notes: Electronic Signature(s) Signed: 07/22/2023 11:57:20 AM By: Samuella Bruin Entered By:  Samuella Bruin on 07/22/2023 06:47:39 [name] [name], [name] [name] ([id number]) [id number] Page 8 of 13 -------------------------------------------------------------------------------- Pain Assessment Details Patient Name: Date of Service: [name] [name], [name] [name]. 07/22/2023 8:45 [name] Medical Record Number: [id number] Patient Account Number: [id number] Date of Birth/Sex: Treating RN: November 30, 1950 (72 y.o. Damaris Schooner Primary Care Golden Emile: Doreen Beam Other Clinician: Referring Asley Baskerville: Treating Dajae Kizer/Extender: Vonzella Nipple, Dhruv Weeks in Treatment: 0 Active Problems Location  of Pain Severity and Description of Pain Patient Has Paino Yes Site Locations Pain Location: Pain in Ulcers With Dressing Change: Yes Duration of the Pain. Constant / Intermittento Intermittent Rate the pain. Current Pain Level: 3 Worst Pain Level: 6 Least Pain Level: 0 Character of Pain Describe the Pain: Aching, Sharp, Shooting Pain Management and Medication Current Pain Management: Medication: Yes Other: lef drop Is the Current Pain Management Adequate: Adequate How does your wound impact your activities of daily livingo Sleep: Yes Bathing: No Appetite: No Relationship With Others: No Bladder Continence: No Emotions: Yes Bowel Continence: No Drive: Yes Toileting: No Hobbies: No Dressing: No Electronic Signature(s) Signed: 07/22/2023 1:02:24 PM By: Zenaida Deed RN, BSN Entered By: Zenaida Deed on 07/22/2023 06:26:59 -------------------------------------------------------------------------------- Patient/Caregiver Education Details Patient Name: Date of Service: Cheri Kearns 11/15/2024andnbsp8:45 [name] Medical Record Number: [id number] Patient Account Number: [id number] Date of Birth/Gender: Treating RN: 1951-05-24 (72 y.o. Marlan Palau Primary Care Physician: Doreen Beam Other Clinician: Referring Physician: Treating Physician/Extender: Teofilo Pod Weeks in Treatment: 0 Education Assessment Education Provided To: Marling, Lief [name] ([id number]) [id number] Page 9 of 13 Patient Education Topics Provided Hyperbaric Oxygenation: Methods: Explain/Verbal Responses: Reinforcements needed, State content correctly Wound Debridement: Methods: Explain/Verbal Responses: Reinforcements needed, State content correctly Wound/Skin Impairment: Methods: Explain/Verbal Responses: Reinforcements needed, State content correctly Electronic Signature(s) Signed: 07/22/2023 11:57:20 AM By: Samuella Bruin Entered By: Samuella Bruin on 07/22/2023 06:48:02 -------------------------------------------------------------------------------- Wound Assessment Details Patient Name: Date of Service: [name] [name], [name] [name]. 07/22/2023 8:45 [name] Medical Record Number: [id number] Patient Account Number: [id number] Date of Birth/Sex: Treating RN: Aug 21, 1951 (72 y.o. M) Primary Care Harlean Regula: Doreen Beam Other Clinician: Referring Cameron Schwinn: Treating Ercilia Bettinger/Extender: Vonzella Nipple, Dhruv Weeks in Treatment: 0 Wound Status Wound Number: 1 Primary Diabetic Wound/Ulcer of the Lower Extremity Etiology: Wound Location: Left, Dorsal Foot Wound Open Wounding Event: Gradually Appeared Status: Date Acquired: 06/02/2023 Comorbid Anemia, Hypertension, Peripheral Arterial Disease, Type II Weeks Of Treatment: 0 History: Diabetes, Neuropathy, Confinement Anxiety Clustered Wound: No Photos Wound Measurements Length: (cm) 1 Width: (cm) 0.6 Depth: (cm) 0.1 Area: (cm) 0.471 Volume: (cm) 0.047 % Reduction in Area: % Reduction in Volume: Epithelialization: Small (1-33%) Tunneling: No Undermining: No Wound Description Classification: Grade 2 Wound Margin: Flat and Intact Exudate Amount: Medium Exudate Type: Serous Exudate Color: amber Porter, Khalil [name] ([id number]) Wound Bed Granulation Amount: None Present  (0%) Necrotic Amount: Large (67-100%) Necrotic Quality: Adherent Slough Foul Odor After Cleansing: No Slough/Fibrino Yes [phone].pdf Page 10 of 13 Exposed Structure Fascia Exposed: No Fat Layer (Subcutaneous Tissue) Exposed: Yes Tendon Exposed: No Muscle Exposed: No Joint Exposed: No Bone Exposed: No Periwound Skin Texture Texture Color No Abnormalities Noted: Yes No Abnormalities Noted: Yes Moisture Temperature / Pain No Abnormalities Noted: No Temperature: No Abnormality Dry / Scaly: Yes Tenderness on Palpation: Yes Treatment Notes Wound #1 (Foot) Wound Laterality: Dorsal, Left Cleanser Soap and Water Discharge Instruction: May shower and wash wound with dial antibacterial soap and water prior to dressing change. Wound Cleanser Discharge Instruction: Cleanse the wound with wound cleanser prior to applying [name] clean dressing using gauze sponges, not tissue or cotton balls. Peri-Wound Care Topical Primary Dressing MediHoney Gel, tube 1.5 (oz) Discharge Instruction: IF SANTYL NOT AVAILABLE - Apply to wound bed as instructed Santyl Ointment Discharge Instruction: Apply nickel thick amount to wound bed as instructed Secondary Dressing ABD Pad, 5x9 Discharge Instruction: Apply over primary dressing as directed. Woven Gauze Sponge, Non-Sterile 4x4 in Discharge Instruction: Moisten gauze with  saline and apply over primary dressing as directed. Secured With Elastic Bandage 4 inch (ACE bandage) Discharge Instruction: Secure with ACE bandage as directed. Kerlix Roll Sterile, 4.5x3.1 (in/yd) Discharge Instruction: Secure with Kerlix as directed. Transpore Surgical Tape, 2x10 (in/yd) Discharge Instruction: Secure dressing with tape as directed. Compression Wrap Compression Stockings Add-Ons Electronic Signature(s) Signed: 07/22/2023 1:02:24 PM By: Zenaida Deed RN, BSN Entered By: Zenaida Deed on 07/22/2023  06:18:35 -------------------------------------------------------------------------------- Wound Assessment Details Patient Name: Date of Service: [name] [name], Bohdi [name]. 07/22/2023 8:45 [name] Medical Record Number: [id number] Patient Account Number: [id number] Date of Birth/Sex: Treating RN: May 01, 1951 (72 y.o. Bayard Hugger, [address] 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 Wound Open Wounding Event: Surgical Injury Status: Date Acquired: 06/02/2023 Comorbid Anemia, Hypertension, Peripheral Arterial Disease, Type II Weeks Of Treatment: 0 History: Diabetes, Neuropathy, Confinement Anxiety Clustered Wound: No Wound Measurements Length: (cm) 5 Width: (cm) 2.5 Depth: (cm) 0.5 Area: (cm) 9.817 Volume: (cm) 4.909 % Reduction in Area: % Reduction in Volume: Epithelialization: Small (1-33%) Tunneling: No Undermining: No Wound Description Classification: Grade 3 Wagner Verification: Culture Wound Margin: Flat and Intact Exudate Amount: Medium Exudate Type: Serosanguineous Exudate Color: red, brown Foul Odor After Cleansing: No Slough/Fibrino Yes Wound Bed Granulation Amount: Small (1-33%) Exposed Structure Granulation Quality: Red Fascia Exposed: No Necrotic Amount: Large (67-100%) Fat Layer (Subcutaneous Tissue) Exposed: Yes Necrotic Quality: Adherent Slough Tendon Exposed: No Muscle Exposed: No Joint Exposed: No Bone Exposed: Yes Periwound Skin Texture Texture Color No Abnormalities Noted: Yes No Abnormalities Noted: Yes Moisture Temperature / Pain No Abnormalities Noted: No Temperature: No Abnormality Dry / Scaly: Yes Tenderness on Palpation:  Yes Treatment Notes Wound #2 (Amputation Site - Toe) Wound Laterality: Left Cleanser Soap and Water Discharge Instruction: May shower and wash wound with dial antibacterial soap and water prior to dressing change. Wound Cleanser Discharge Instruction: Cleanse the wound with wound cleanser prior to applying [name] clean dressing using gauze sponges, not tissue or cotton balls. Peri-Wound Care Topical Primary Dressing MediHoney Gel, tube 1.5 (oz) Discharge Instruction: IF SANTYL NOT AVAILABLE - Apply to wound bed as instructed Santyl Ointment Discharge Instruction: Apply nickel thick amount to wound bed as instructed Secondary Dressing ABD Pad, 5x9 Discharge Instruction: Apply over primary dressing as directed. Woven Gauze Sponge, Non-Sterile 4x4 in Discharge Instruction: Moisten gauze with saline and apply over primary dressing as directed. Secured With Elastic Bandage 4 inch (ACE bandage) Discharge Instruction: Secure with ACE bandage as directed. Ozimek, Myshawn [name] ([id number]) [phone].pdf Page 12 of 13 Kerlix Roll Sterile, 4.5x3.1 (in/yd) Discharge Instruction: Secure with Kerlix as directed. Transpore Surgical Tape, 2x10 (in/yd) Discharge Instruction: Secure dressing with tape as directed. Compression Wrap Compression Stockings Add-Ons Electronic Signature(s) Signed: 07/22/2023 1:02:24 PM By: Zenaida Deed RN, BSN Entered By: Zenaida Deed on 07/22/2023 09:24:15 -------------------------------------------------------------------------------- Wound Assessment Details Patient Name: Date of Service: [name] [name], Yardley [name]. 07/22/2023 8:45 [name] Medical Record Number: [id number] Patient Account Number: [id number] Date of Birth/Sex: Treating RN: July 18, 1951 (72 y.o. 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 Wounding Event:  Gradually Appeared Etiology: Date Acquired: 06/07/2023 Wound Open Weeks Of Treatment: 0 Status: Clustered Wound: No Comorbid Anemia, Hypertension, Peripheral Arterial Disease, Type II History: Diabetes, Neuropathy, Confinement Anxiety Wound Measurements Length: (cm) 7.5 Width: (cm) 3 Depth: (cm) 0.1 Area: (cm) 17.671 Volume: (cm) 1.767 % Reduction in Area: % Reduction in Volume: Epithelialization: Small (1-33%) Tunneling: No Undermining: No Wound Description Classification: Grade 4 Wagner Verification: Other Wound Margin: Flat and Intact Method: observation Rationale: gangrene Exudate Amount: Small Exudate Type: Serosanguineous Exudate Color: red, brown Foul Odor After Cleansing: No Slough/Fibrino Yes Wound Bed Granulation Amount: None Present (0%) Exposed Structure Necrotic Amount: Large (67-100%) Fascia Exposed: No Necrotic Quality: Eschar Fat Layer (Subcutaneous Tissue) Exposed: Yes Tendon Exposed: No Muscle Exposed: No Joint Exposed: No Bone Exposed: No Periwound Skin Texture Texture Color No Abnormalities Noted: Yes No Abnormalities Noted: Yes Moisture Temperature / Pain No Abnormalities Noted: No Temperature: No Abnormality Dry / Scaly: Yes Tenderness on Palpation: Yes Abt, Burke [name] ([id number]) [id number].pdf Page 13 of 13 Electronic Signature(s) Signed: 07/22/2023 1:02:24 PM By: Zenaida Deed RN, BSN Entered By: Zenaida Deed on 07/22/2023 09:21:53 -------------------------------------------------------------------------------- Vitals Details Patient Name: Date of Service: [name] [name], Reyli [name]. 07/22/2023 8:45 [name] Medical Record Number: [id number] Patient Account Number: [id number] Date of Birth/Sex: Treating RN: [birth year] (72 y.o. Damaris Schooner Primary Care Trevious Rampey:  Doreen Beam Other Clinician: Referring Judith Campillo: Treating Arvis Miguez/Extender: Vonzella Nipple, Dhruv Weeks in Treatment: 0 Vital Signs Time Taken: 08:51 Temperature (F): 98.3 Height (in): 68 Pulse (bpm): 62 Source: Stated Respiratory Rate (breaths/min): 18 Weight (lbs): 198 Blood Pressure (mmHg): 154/71 Source: Stated Reference Range: 80 - 120 mg / dl Body Mass Index (BMI): 30.1 Notes pt does not check blood sugars at home Electronic Signature(s) Signed: 07/22/2023 1:02:24 PM By: Zenaida Deed RN, BSN Entered By: Zenaida Deed on 07/22/2023 05:52:44

## 2023-07-26 DIAGNOSIS — I96 Gangrene, not elsewhere classified: Secondary | ICD-10-CM | POA: Diagnosis not present

## 2023-07-29 ENCOUNTER — Other Ambulatory Visit: Payer: Self-pay

## 2023-07-29 ENCOUNTER — Encounter (HOSPITAL_BASED_OUTPATIENT_CLINIC_OR_DEPARTMENT_OTHER): Payer: Medicare HMO | Admitting: General Surgery

## 2023-07-29 DIAGNOSIS — I70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: Secondary | ICD-10-CM

## 2023-07-29 DIAGNOSIS — I70222 Atherosclerosis of native arteries of extremities with rest pain, left leg: Secondary | ICD-10-CM

## 2023-07-29 DIAGNOSIS — E11621 Type 2 diabetes mellitus with foot ulcer: Secondary | ICD-10-CM | POA: Diagnosis not present

## 2023-07-29 NOTE — Progress Notes (Signed)
[name] [name] ([id number]) [phone].pdf Page 1 of 15 Visit Report for 07/29/2023 Chief Complaint Document Details Patient Name: Date of Service: [name] [name], [name] [name] 07/29/2023 9:45 [name] Medical Record Number: [id number] Patient Account Number: [id number] Date of Birth/Sex: Treating RN: June 06, 1951 (72 y.o. M) Primary Care Provider: Doreen Beam Other Clinician: Referring Provider: Treating Provider/Extender: Vonzella Nipple, Dhruv Weeks in Treatment: 1 Information Obtained from: Patient Chief Complaint Patients presents for treatment of open diabetic ulcers in the setting of severe peripheral vascular disease Electronic Signature(s) Signed: 07/29/2023 10:52:58 AM By: Duanne Guess MD FACS Entered By: Duanne Guess on 07/29/2023 10:52:58 -------------------------------------------------------------------------------- Debridement Details Patient Name: Date of Service: [name] [name], [name] [name]. 07/29/2023 9:45 [name] Medical Record Number: [id number] Patient Account Number: [id number] Date of Birth/Sex: Treating RN: 1950-12-04 (72 y.o. Damaris Schooner Primary Care Provider: Doreen Beam Other Clinician: Referring Provider: Treating Provider/Extender: Teofilo Pod Weeks in Treatment: 1 Debridement Performed for Assessment: Wound #2 Left Amputation Site - Toe Performed By: Physician Duanne Guess, MD The following information was scribed by: Zenaida Deed The information was scribed for: Duanne Guess Debridement Type: Debridement Severity of Tissue Pre Debridement: Fat layer exposed Level of Consciousness (Pre-procedure): Awake and Alert Pre-procedure Verification/Time Out Yes - 09:55 Taken: Start Time: 09:58 Pain Control: Lidocaine 4% T opical Solution Percent of Wound Bed Debrided: 100% T Area Debrided (cm): otal 11.19 Tissue and other material debrided: Viable, Non-Viable, Slough, Subcutaneous, Slough Level: Skin/Subcutaneous  Tissue Debridement Description: Excisional Instrument: Curette Bleeding: Minimum Hemostasis Achieved: Pressure Procedural Pain: 4 Post Procedural Pain: 3 Response to Treatment: Procedure was tolerated well Level of Consciousness (Post- Awake and Alert procedure): Post Debridement Measurements of Total Wound Length: (cm) 5.7 Width: (cm) 2.5 Depth: (cm) 0.8 Volume: (cm) 8.954 [name] [name] ([id number]) [id number] Page 2 of 15 Character of Wound/Ulcer Post Debridement: Requires Further Debridement Severity of Tissue Post Debridement: Fat layer exposed Post Procedure Diagnosis Same as Pre-procedure Electronic Signature(s) Signed: 07/29/2023 12:30:01 PM By: Duanne Guess MD FACS Signed: 07/29/2023 1:02:55 PM By: Zenaida Deed RN, BSN Entered By: Zenaida Deed on 07/29/2023 10:03:52 -------------------------------------------------------------------------------- Debridement Details Patient Name: Date of Service: [name] [name], [name] [name]. 07/29/2023 9:45 [name] Medical Record Number: [id number] Patient Account Number: [id number] Date of Birth/Sex: Treating RN: [birth year] (72 y.o. Damaris Schooner Primary Care Provider: Doreen Beam Other Clinician: Referring Provider: Treating Provider/Extender: Teofilo Pod Weeks in Treatment: 1 Debridement Performed for Assessment: Wound #3 Left T Great oe Performed By: Physician Duanne Guess, MD The following information was scribed by: Zenaida Deed The information was scribed for: Duanne Guess Debridement Type: Debridement Severity of Tissue Pre Debridement: Necrosis of muscle Level of Consciousness (Pre-procedure): Awake and Alert Pre-procedure Verification/Time Out Yes - 09:55 Taken: Start Time: 09:58 Pain Control: Lidocaine 4% T opical Solution Percent of Wound Bed Debrided: 100% T Area Debrided (cm): otal 29.44 Tissue and other material debrided: Viable, Non-Viable, Slough,  Subcutaneous, Tendon, Slough Level: Skin/Subcutaneous Tissue/Muscle Debridement Description: Excisional Instrument: Curette Bleeding: Minimum Hemostasis Achieved: Pressure Procedural Pain: 4 Post Procedural Pain: 3 Response to Treatment: Procedure was tolerated well Level of Consciousness (Post- Awake and Alert procedure): Post Debridement Measurements of Total Wound Length: (cm) 7.5 Width: (cm) 5 Depth: (cm) 0.2 Volume: (cm) 5.89 Character of Wound/Ulcer Post Debridement: Requires Further Debridement Severity of Tissue Post Debridement: Necrosis of muscle Post Procedure Diagnosis Same as Pre-procedure Electronic Signature(s) Signed: 07/29/2023 12:30:01 PM By: Duanne Guess MD FACS Signed: 07/29/2023 1:02:55 PM By: Zenaida Deed RN, BSN  Entered By: Zenaida Deed on 07/29/2023 10:04:48 [name] [name] ([id number]) [id number] Page 3 of 15 -------------------------------------------------------------------------------- Debridement Details Patient Name: Date of Service: [name] [name], [name] [name] 07/29/2023 9:45 [name] Medical Record Number: [id number] Patient Account Number: [id number] Date of Birth/Sex: Treating RN: [birth year] (71 y.o. Damaris Schooner Primary Care Provider: Doreen Beam Other Clinician: Referring Provider: Treating Provider/Extender: Teofilo Pod Weeks in Treatment: 1 Debridement Performed for Assessment: Wound #1 Left,Dorsal Foot Performed By: Physician Duanne Guess, MD The following information was scribed by: Zenaida Deed The information was scribed for: Duanne Guess Debridement Type: Debridement Severity of Tissue Pre Debridement: Fat layer exposed Level of Consciousness (Pre-procedure): Awake and Alert Pre-procedure Verification/Time Out Yes - 09:55 Taken: Start Time: 09:58 Pain Control: Lidocaine 4% T opical Solution Percent of Wound Bed Debrided: 100% T Area Debrided (cm): otal 0.24 Tissue and  other material debrided: Viable, Non-Viable, Slough, Subcutaneous, Slough Level: Skin/Subcutaneous Tissue Debridement Description: Excisional Instrument: Curette Bleeding: Minimum Hemostasis Achieved: Pressure Procedural Pain: 4 Post Procedural Pain: 3 Response to Treatment: Procedure was tolerated well Level of Consciousness (Post- Awake and Alert procedure): Post Debridement Measurements of Total Wound Length: (cm) 0.5 Width: (cm) 0.6 Depth: (cm) 0.1 Volume: (cm) 0.024 Character of Wound/Ulcer Post Debridement: Requires Further Debridement Severity of Tissue Post Debridement: Fat layer exposed Post Procedure Diagnosis Same as Pre-procedure Electronic Signature(s) Signed: 07/29/2023 12:30:01 PM By: Duanne Guess MD FACS Signed: 07/29/2023 1:02:55 PM By: Zenaida Deed RN, BSN Entered By: Zenaida Deed on 07/29/2023 10:05:38 -------------------------------------------------------------------------------- HPI Details Patient Name: Date of Service: [name] [name], Yehya [name]. 07/29/2023 9:45 [name] Medical Record Number: [id number] Patient Account Number: [id number] Date of Birth/Sex: Treating RN: 1951/06/20 (72 y.o. M) Primary Care Provider: Doreen Beam Other Clinician: Referring Provider: Treating Provider/Extender: Vonzella Nipple, Dhruv Weeks in Treatment: 1 History of Present Illness [name] [name], [name] [name] ([id number]) [id number] Page 4 of 15 HPI Description: ADMISSION 07/22/2023 ***POST-REVASCULARIZATION ABI/DOPPLER:*** ABI: +--------+------------------+-----+----------+--------+ Right Rt Pressure (mmHg)IndexWaveform Comment  +--------+------------------+-----+----------+--------+ [id number]     +--------+------------------+-----+----------+--------+ PTA 91 0.64 monophasic  +--------+------------------+-----+----------+--------+ DP 97 0.68 monophasic   +--------+------------------+-----+----------+--------+ +--------+------------------+-----+----------+-------+ Left Lt Pressure (mmHg)IndexWaveform Comment +--------+------------------+-----+----------+-------+ [id number]     +--------+------------------+-----+----------+-------+ PTA 160 1.13 biphasic   +--------+------------------+-----+----------+-------+ DP 101 0.71 monophasic  +--------+------------------+-----+----------+-------+ LLE Duplex: +----------+--------+-----+---------------+--------+--------+ LEFT PSV cm/sRatioStenosis WaveformComments +----------+--------+-----+---------------+--------+--------+ CFA Distal204  50-74% stenosis   +----------+--------+-----+---------------+--------+--------+ SFA Prox 202  50-74% stenosis   +----------+--------+-----+---------------+--------+--------+ SFA Mid 152  30-49% stenosis   +----------+--------+-----+---------------+--------+--------+ Left Graft #1: mid SFA to posterior tibial +--------------------+--------+---------------+--------+----------------+  PSV cm/sStenosis WaveformComments  +--------------------+--------+---------------+--------+----------------+ Inflow 152     +--------------------+--------+---------------+--------+----------------+ Proximal Anastomosis360 >70% stenosis    +--------------------+--------+---------------+--------+----------------+ Proximal Graft 180 50-70% stenosis low end of range +--------------------+--------+---------------+--------+----------------+ Mid Graft 136     +--------------------+--------+---------------+--------+----------------+ Distal Graft 141     +--------------------+--------+---------------+--------+----------------+ Distal Anastomosis 183 50-70% stenosis   +--------------------+--------+---------------+--------+----------------+ Outflow 224 50-74% stenosis    +--------------------+--------+---------------+--------+----------------+ This is [name] 72 year old type II diabetic (last hemoglobin A1c 6.7% on June 07, 2023) with severe peripheral vascular occlusive disease. 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 Preoperative MRI did not demonstrate evidence of osteomyelitis. Unfortunately, ABIs were not performed prior to that operation and postoperatively, ABIs were done that showed severe bilateral lower extremity arterial occlusive disease with minimal perfusion to the left lower leg. He was transferred to the vascular surgery service at [facility]. An arteriogram was done that showed occlusion of the left common iliac artery. This was stented, but additional inflow was necessary to  provide better blood flow to the foot. He subsequently underwent [name] femoral to tibial artery bypass with saphenous vein graft on October 8. 1 month post bypass arterial studies are copied above. He continued to receive wound care at [facility]. His last visit there was on 12 November. Their note describes the base of the ray amputation appearing viable with buds of granulation appearing. The surrounding digits appeared to be marginal with ischemic eschar formation. They have been doing Dakin's wet-to-dry dressings. He was referred to our center to be evaluated for hyperbaric oxygen therapy. 07/29/2023: The dorsal foot wound is essentially unchanged. I can no longer probe to bone at the amputation site. Tendon is exposed on the dorsal aspect of the great toe, but the black eschar has not reaccumulated. Santyl was cost prohibitive so they have been using Medihoney. We are working on getting him approved for hyperbaric oxygen therapy. Electronic Signature(s) Signed: 07/29/2023 10:54:01 AM By: Duanne Guess MD FACS Entered By: Duanne Guess on 07/29/2023  10:54:01 -------------------------------------------------------------------------------- Physical Exam Details Patient Name: Date of Service: [name] [name], [name] [name]. 07/29/2023 9:45 [name] Medical Record Number: [id number] Patient Account Number: [id number] [name] [name], [name] [name] ([id number]) [phone].pdf Page 5 of 15 Date of Birth/Sex: Treating RN: June 22, 1951 (72 y.o. M) Primary Care Provider: Other Clinician: Doreen Beam Referring Provider: Treating Provider/Extender: Vonzella Nipple, Dhruv Weeks in Treatment: 1 Constitutional . . . . no acute distress. Respiratory Normal work of breathing on room air.. Notes 07/29/2023: The dorsal foot wound is essentially unchanged. I can no longer probe to bone at the amputation site. Tendon is exposed on the dorsal aspect of the great toe, but the black eschar has not reaccumulated. Electronic Signature(s) Signed: 07/29/2023 10:54:37 AM By: Duanne Guess MD FACS Entered By: Duanne Guess on 07/29/2023 10:54:36 -------------------------------------------------------------------------------- Physician Orders Details Patient Name: Date of Service: [name] [name], [name] [name]. 07/29/2023 9:45 [name] Medical Record Number: [id number] Patient Account Number: [id number] Date of Birth/Sex: Treating RN: 1951/05/22 (72 y.o. 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 - Dr. Leanord Hawking Return [name] Return appointment in 3 weeks. - Dr. Leanord Hawking Return appointment in 1 month. - Dr. Lady Gary Anesthetic (In clinic) Topical Lidocaine 4% applied to wound bed Bathing/ Shower/ Hygiene May shower and wash wound with soap and water. Edema Control - Orders / Instructions Elevate legs to the level of the heart or above for 30 minutes daily and/or when sitting for 3-4 times [name] day throughout the day. Avoid standing for long periods of time. Additional Orders / Instructions Follow Nutritious Diet - 70-100 grams of protein [name] day - can supplement with protein shakes ensure/premier vitamin C start 500 mg once [name] day and if tolerated then take twice [name] day zinc 30-50 mg once  [name] day Home Health No change in wound care orders this week; continue Home Health for wound care. May utilize formulary equivalent dressing for wound Salois, Oather [name] ([id number]) [id number] Page 6 of 15 treatment orders unless otherwise specified. Dressing changes to be completed by Home Health on Monday / Wednesday / Friday except when patient has scheduled visit at Wound Care Center. - ONLY USE MEDIHONEY IF SANYL IS NOT AVAILABLE Other Home Health Orders/Instructions: - [facility] O[facility] Therapy Evaluate for HBO Therapy Indication: - Wagner 4 diabetic foot ulcer If appropriate for treatment, begin HBOT per protocol: 2.5 ATA for 90 Minutes with 2 Five (5) Minute [name] Breaks ir Total Number of Treatments: - 40 One treatments per day (delivered Monday through Friday unless otherwise specified in Special Instructions below): Finger stick Blood Glucose  Pre- and Post- HBOT Treatment. Follow Hyperbaric Oxygen Glycemia Protocol Give two 4oz orange juices in addition to Glucerna when the glycemic protocol is used. [name] frin (Oxymetazoline HCL) 0.05% nasal spray - 1 spray in both nostrils daily as needed prior to HBO treatment for difficulty clearing ears Wound Treatment Wound #1 - Foot Wound Laterality: Dorsal, Left Cleanser: Soap and Water 1 x Per Day/30 Days Discharge Instructions: May shower and wash wound with dial antibacterial soap and water prior to dressing change. Cleanser: Wound Cleanser 1 x Per Day/30 Days Discharge Instructions: Cleanse the wound with wound cleanser prior to applying [name] clean dressing using gauze sponges, not tissue or cotton balls. Prim Dressing: MediHoney Gel, tube 1.5 (oz) 1 x Per Day/30 Days ary Discharge Instructions: IF SANTYL NOT AVAILABLE - Apply to wound bed as instructed Prim Dressing: Santyl Ointment 1 x Per Day/30 Days ary Discharge Instructions: Apply nickel thick amount to wound bed as instructed Secured With: Elastic Bandage 4 inch (ACE bandage) 1 x Per Day/30 Days Discharge Instructions: Secure with ACE bandage as directed. Secured With: [employer], 4.5x3.1 (in/yd) 1 x Per Day/30 Days Discharge Instructions: Secure with Kerlix as directed. Secured With: Transpore Surgical Tape, 2x10 (in/yd) 1 x Per Day/30 Days Discharge Instructions: Secure dressing with tape as directed. Wound #2 - Amputation Site - Toe Wound Laterality: Left Cleanser: Soap and Water 1 x Per Day/30 Days Discharge Instructions: May shower and wash wound with dial antibacterial soap and water prior to dressing change. Cleanser: Wound Cleanser 1 x Per Day/30 Days Discharge Instructions: Cleanse the wound with wound cleanser prior to applying [name] clean dressing using gauze sponges, not tissue or cotton balls. Prim Dressing: MediHoney Gel, tube 1.5 (oz) 1 x Per Day/30 Days ary Discharge Instructions: IF SANTYL NOT AVAILABLE -  Apply to wound bed as instructed Prim Dressing: Santyl Ointment 1 x Per Day/30 Days ary Discharge Instructions: Apply nickel thick amount to wound bed as instructed Secondary Dressing: ABD Pad, 5x9 1 x Per Day/30 Days Discharge Instructions: Apply over primary dressing as directed. Secondary Dressing: Woven Gauze Sponge, Non-Sterile 4x4 in 1 x Per Day/30 Days Discharge Instructions: Moisten gauze with saline and apply over primary dressing as directed. Secured With: Elastic Bandage 4 inch (ACE bandage) 1 x Per Day/30 Days Discharge Instructions: Secure with ACE bandage as directed. Secured With: [employer], 4.5x3.1 (in/yd) 1 x Per Day/30 Days Discharge Instructions: Secure with Kerlix as directed. Secured With: Transpore Surgical Tape, 2x10 (in/yd) 1 x Per Day/30 Days Discharge Instructions: Secure dressing with tape as directed. Laboratory CBC (hemogram) of blood (HEM-CBC) - pre-hyperbaric oxygen therapy LOINC Code: [id number] Convenience Name: CBC (hemogram) Radiology X-ray, Chest -  pre-hyperbaric oxygen therapy Rumery, Lucifer [name] ([id number]) [id number] Page 7 of 15 GLYCEMIA INTERVENTIONS PROTOCOL PRE-HBO GLYCEMIA INTERVENTIONS ACTION INTERVENTION Obtain pre-HBO capillary blood glucose (ensure 1 physician order is in chart). [name]. Notify HBO physician and await physician orders. 2 If result is 70 mg/dl or below: B. If the result meets the hospital definition of [name] critical result, follow hospital policy. [name]. Give patient an 8 ounce Glucerna Shake, an 8 ounce Ensure, or 8 ounces of [name] Glucerna/Ensure equivalent dietary supplement*. B. Wait 30 minutes. If result is 71 mg/dl to 027 mg/dl: C. Retest patients capillary blood glucose (CBG). D. If result greater than or equal to 110 mg/dl, proceed with HBO. If result less than 110 mg/dl, notify HBO physician and consider holding HBO. If result is 131 mg/dl to 253 mg/dl: [name]. Proceed with HBO. [name]. Notify  HBO physician and await physician orders. B. It is recommended to hold HBO and do If result is 250 mg/dl or greater: blood/urine ketone testing. C. If the result meets the hospital definition of [name] critical result, follow hospital policy. POST-HBO GLYCEMIA INTERVENTIONS ACTION INTERVENTION Obtain post HBO capillary blood glucose (ensure 1 physician order is in chart). [name]. Notify HBO physician and await physician orders. 2 If result is 70 mg/dl or below: B. If the result meets the hospital definition of [name] critical result, follow hospital policy. [name]. Give patient an 8 ounce Glucerna Shake, an 8 ounce Ensure, or 8 ounces of [name] Glucerna/Ensure equivalent dietary supplement*. B. Wait 15 minutes for symptoms of If result is 71 mg/dl to 664 mg/dl: hypoglycemia (i.e. nervousness, anxiety, sweating, chills, clamminess, irritability, confusion, tachycardia or dizziness). C. If patient asymptomatic, discharge patient. If patient symptomatic, repeat capillary blood glucose (CBG) and notify HBO physician. If result is 101 mg/dl to 403 mg/dl: [name]. Discharge patient. [name]. Notify HBO physician and await physician orders. B. It is recommended to do blood/urine ketone If result is 250 mg/dl or greater: testing. C. If the result meets the hospital definition of [name] critical result, follow hospital policy. *Juice or candies are NOT equivalent products. If patient refuses the Glucerna or Ensure, please consult the hospital dietitian for an appropriate substitute. Electronic Signature(s) Signed: 07/29/2023 12:30:01 PM By: Duanne Guess MD FACS Entered By: Duanne Guess on 07/29/2023 10:55:58 Prescription 07/29/2023 -------------------------------------------------------------------------------- Wedemeyer, Elicia Lamp MD Patient Name: Provider: Aug 22, 1951 [id number] Date of Birth: NPI#: [id number] Sex: DEA #: [phone] Phone #: License #: UPN: Patient Address: [address] Eligha Bridegroom [facility] [place], [place] [address] [address] [place], [place] [address] [phone] [name] [name], [name] [name] HODGKINSON ([id number]) [id number] Page 8 of 15 Allergies gadobutrol; Iodinated Contrast Media Provider's Orders X-ray, Chest - pre-hyperbaric oxygen therapy Hand Signature: Date(s): Prescription 07/29/2023 Bartoletti, Elicia Lamp MD Patient Name: Provider: 1951/05/16 [id number] Date of Birth: NPI#: Judie Petit [id number] Sex: DEA #: [phone] Phone #: License #: UPN: Patient Address: [address] Eligha Bridegroom [facility] [place], [place] [address] [place], [place] [address] [phone] Allergies gadobutrol; Iodinated Contrast Media Provider's Orders CBC (hemogram) of blood - pre-hyperbaric oxygen therapy LOINC Code: 58410-2 Convenience Name: CBC (hemogram) Hand Signature: Date(s): Electronic Signature(s) Signed: 07/29/2023 11:10:17 AM By: Duanne Guess MD FACS Entered By: Duanne Guess on 07/29/2023 11:10:17 -------------------------------------------------------------------------------- Problem List Details Patient Name: Date of Service: [name] [name], Amauris [name]. 07/29/2023 9:45 [name] Medical Record Number: [id number] Patient Account Number: [id number] Date of Birth/Sex:  Treating RN: [birth year] (72 y.o. 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 07/22/2023 No Yes without evidence of necrosis L97.523 Non-pressure chronic ulcer of other part of left foot with necrosis of muscle 07/22/2023 No Yes L97.522 Non-pressure chronic ulcer of other part of left foot  with fat layer exposed 07/22/2023 No Yes Aldama, Lakoda [name] ([id number]) [id number] Page 9 of 15 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 07/29/2023 9:45 [name] Medical Record Number: [id number] Patient Account Number: [id number] Date of Birth/Sex: Treating RN: 1951/02/08 (72 y.o. M) Primary Care Provider: Doreen Beam Other Clinician: Referring Provider: Treating Provider/Extender: Vonzella Nipple, Dhruv Weeks in Treatment: 1 Subjective Chief Complaint Information obtained from Patient Patients presents for treatment of open diabetic ulcers in the setting of severe peripheral vascular disease History of Present Illness (HPI) ADMISSION 07/22/2023 ***POST-REVASCULARIZATION ABI/DOPPLER:*** ABI: +--------+------------------+-----+----------+--------+ Right Rt Pressure (mmHg)IndexWaveform Comment  +--------+------------------+-----+----------+--------+ [id number]     +--------+------------------+-----+----------+--------+ PTA 91 0.64 monophasic  +--------+------------------+-----+----------+--------+ DP 97 0.68 monophasic  +--------+------------------+-----+----------+--------+ +--------+------------------+-----+----------+-------+ Left Lt Pressure (mmHg)IndexWaveform  Comment +--------+------------------+-----+----------+-------+ [id number]     +--------+------------------+-----+----------+-------+ PTA 160 1.13 biphasic   +--------+------------------+-----+----------+-------+ DP 101 0.71 monophasic  +--------+------------------+-----+----------+-------+ LLE Duplex: Whitefield, Miner [name] ([id number]) [id number] Page 10 of 15 +----------+--------+-----+---------------+--------+--------+ LEFT PSV cm/sRatioStenosis WaveformComments +----------+--------+-----+---------------+--------+--------+ CFA Distal204  50-74% stenosis   +----------+--------+-----+---------------+--------+--------+ SFA Prox 202  50-74% stenosis   +----------+--------+-----+---------------+--------+--------+ SFA Mid 152  30-49% stenosis   +----------+--------+-----+---------------+--------+--------+ Left Graft #1: mid SFA to posterior tibial +--------------------+--------+---------------+--------+----------------+  PSV cm/sStenosis WaveformComments  +--------------------+--------+---------------+--------+----------------+ Inflow 152     +--------------------+--------+---------------+--------+----------------+ Proximal Anastomosis360 >70% stenosis    +--------------------+--------+---------------+--------+----------------+ Proximal Graft 180 50-70% stenosis low end of range +--------------------+--------+---------------+--------+----------------+ Mid Graft 136     +--------------------+--------+---------------+--------+----------------+ Distal Graft 141     +--------------------+--------+---------------+--------+----------------+ Distal Anastomosis 183 50-70% stenosis   +--------------------+--------+---------------+--------+----------------+ Outflow 224 50-74% stenosis   +--------------------+--------+---------------+--------+----------------+ This is [name]  72 year old type II diabetic (last hemoglobin A1c 6.7% on June 07, 2023) with severe peripheral vascular occlusive disease. 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 Preoperative MRI did not demonstrate evidence of osteomyelitis. Unfortunately, ABIs were not performed prior to that operation and postoperatively, ABIs were done that showed severe bilateral lower extremity arterial occlusive disease with minimal perfusion to the left lower leg. He was transferred to the vascular surgery service at [facility]. An arteriogram was done that showed occlusion of the left common iliac artery. This was stented, but additional inflow was necessary to provide better blood flow to the foot. He subsequently underwent [name] femoral to tibial artery bypass with saphenous vein graft on October 8. 1 month post bypass arterial studies are copied above. He continued to receive wound care at [facility]. His last visit there was on 12 November. Their note describes the base of the ray amputation appearing viable with buds of  granulation appearing. The surrounding digits appeared to be marginal with ischemic eschar formation. They have been doing Dakin's wet-to-dry dressings. He was referred to our center to be evaluated for hyperbaric oxygen therapy. 07/29/2023: The dorsal foot wound is essentially unchanged. I can no longer probe to bone at the amputation site. Tendon is exposed on the dorsal aspect of the great toe, but the black eschar has not reaccumulated. Santyl was cost prohibitive so they have been using Medihoney. We are working on getting him approved for hyperbaric oxygen therapy. Patient History Information obtained from Patient, Caregiver, Chart. Family History Cancer - Father, Diabetes - Mother, Heart Disease - Paternal Grandparents, Hypertension - Father,Mother, Stroke - Mother, No family history of Hereditary Spherocytosis, Kidney Disease,  Lung Disease, Seizures, Thyroid Problems, Tuberculosis. Social History Former smoker - quit 06/01/2023 - ended on 06/01/2023, Marital Status - Married, Alcohol Use - Rarely, Drug Use - No History, Caffeine Use - Moderate. Medical History Eyes Denies history of Cataracts, Glaucoma Ear/Nose/Mouth/Throat Denies history of Chronic sinus problems/congestion, Middle ear problems Hematologic/Lymphatic Patient has history of Anemia - normocytic Cardiovascular Patient has history of Hypertension, Peripheral Arterial Disease Endocrine Patient has history of Type II Diabetes - Hem A1C 6.7% (06/07/23) Denies history of Type I Diabetes Genitourinary Denies history of End Stage Renal Disease Integumentary (Skin) Denies history of History of Burn Neurologic Patient has history of Neuropathy Oncologic Denies history of Received Chemotherapy, Received Radiation Psychiatric Patient has history of Confinement Anxiety Denies history of Anorexia/bulimia Hospitalization/Surgery History - left fem-pop [facility]. 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 Vitals Time Taken: 9:31 AM, Height: 68 in, Weight: 198 lbs, BMI: 30.1, Temperature: 98.3 F, Pulse: 89 bpm, Respiratory Rate: 20 breaths/min, Blood Pressure: 121/52 mmHg. Respiratory Normal work of breathing on room air.. General Notes: 07/29/2023: The dorsal foot wound is essentially unchanged. I can no longer probe to bone at the amputation site. Tendon is exposed on the dorsal aspect of the great toe, but the black eschar has not reaccumulated. Integumentary (Hair,  Skin) Wound #1 status is Open. Original cause of wound was Gradually Appeared. The date acquired was: 06/02/2023. The wound has been in treatment 1 weeks. The wound is located on the Left,Dorsal Foot. The wound measures 0.5cm length x 0.6cm width x 0.1cm depth; 0.236cm^2 area and 0.024cm^3 volume. There is Fat Layer (Subcutaneous Tissue) exposed. There is no tunneling or undermining noted. There is [name] medium amount of serous drainage noted. The wound margin is flat and intact. There is small (1-33%) pink granulation within the wound bed. There is [name] large (67-100%) amount of necrotic tissue within the wound bed including Adherent Slough. The periwound skin appearance had no abnormalities noted for texture. The periwound skin appearance had no abnormalities noted for color. The periwound skin appearance exhibited: Dry/Scaly. Periwound temperature was noted as No Abnormality. The periwound has tenderness on palpation. Wound #2 status is Open. Original cause of wound was Surgical Injury. The date acquired was: 06/02/2023. The wound has been in treatment 1 weeks. The wound is located on the Left Amputation Site - T The wound measures 5.7cm length x 2.5cm width x 0.8cm depth; 11.192cm^2 area and 8.954cm^3 volume. oe. There is bone and Fat  Layer (Subcutaneous Tissue) exposed. There is no tunneling or undermining noted. There is [name] medium amount of serosanguineous drainage noted. The wound margin is flat and intact. There is small (1-33%) red granulation within the wound bed. There is [name] large (67-100%) amount of necrotic tissue within the wound bed including Adherent Slough. The periwound skin appearance had no abnormalities noted for texture. The periwound skin appearance had no abnormalities noted for color. The periwound skin appearance exhibited: Dry/Scaly. Periwound temperature was noted as No Abnormality. The periwound has tenderness on palpation. Wound #3 status is Open. Original cause of wound was  Gradually Appeared. The date acquired was: 06/07/2023. The wound has been in treatment 1 weeks. The wound is located on the Left T Great. The wound measures 7.5cm length x 5cm width x 0.2cm depth; 29.452cm^2 area and 5.89cm^3 volume. There is Fat oe Layer (Subcutaneous Tissue) exposed. There is no tunneling or undermining noted. There is [name] medium amount of serosanguineous drainage noted. The wound margin is flat and intact. There is medium (34-66%) red, pink granulation within the wound bed. There is [name] medium (34-66%) amount of necrotic tissue within the wound bed including Eschar and Adherent Slough. The periwound skin appearance had no abnormalities noted for texture. The periwound skin appearance had no abnormalities noted for color. The periwound skin appearance exhibited: Dry/Scaly. Periwound temperature was noted as No Abnormality. The periwound has tenderness on palpation. 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 There was [name] Excisional Skin/Subcutaneous Tissue Debridement with [name] total area of 0.24 sq cm performed by Duanne Guess, MD. With the following instrument(s): Curette to remove Viable and Non-Viable tissue/material. Material removed includes Subcutaneous Tissue and Slough and after achieving pain  control using Lidocaine 4% Topical Solution. No specimens were taken. [name] time out was conducted at 09:55, prior to the start of the procedure. [name] Minimum amount of bleeding was controlled with Pressure. The procedure was tolerated well with [name] pain level of 4 throughout and [name] pain level of 3 following the procedure. Post Debridement Measurements: 0.5cm length x 0.6cm width x 0.1cm depth; 0.024cm^3 volume. Character of Wound/Ulcer Post Debridement requires further debridement. Severity of Tissue Post Debridement is: Fat layer exposed. Post procedure Diagnosis Wound #1: Same as Pre-Procedure Wound #2 Pre-procedure diagnosis of Wound #2 is [name] Diabetic Wound/Ulcer of the Lower Extremity located on the Left Amputation Site - T .Severity of Tissue Pre oe Debridement is: Fat layer exposed. There was [name] Excisional Skin/Subcutaneous Tissue Debridement with [name] total area of 11.19 sq cm performed by Duanne Guess, MD. With the following instrument(s): Curette to remove Viable and Non-Viable tissue/material. Material removed includes Subcutaneous Tissue and Slough and after achieving pain control using Lidocaine 4% T opical Solution. No specimens were taken. [name] time out was conducted at 09:55, prior to the start of the procedure. [name] Minimum amount of bleeding was controlled with Pressure. The procedure was  tolerated well with [name] pain level of 4 throughout and [name] pain level [name] [name], [name] [name] ([id number]) [id number] Page 12 of 15 of 3 following the procedure. Post Debridement Measurements: 5.7cm length x 2.5cm width x 0.8cm depth; 8.954cm^3 volume. Character of Wound/Ulcer Post Debridement requires further debridement. Severity of Tissue Post Debridement is: Fat layer exposed. Post procedure Diagnosis Wound #2: Same as Pre-Procedure Wound #3 Pre-procedure diagnosis of Wound #3 is [name] Diabetic Wound/Ulcer of the Lower Extremity located on the Left T Great .Severity of Tissue Pre Debridement  is: oe Necrosis of muscle. There was [name] Excisional Skin/Subcutaneous Tissue/Muscle Debridement with [name] total area of 29.44 sq cm performed by Duanne Guess, MD. With the following instrument(s): Curette to remove Viable and Non-Viable tissue/material. Material removed includes Tendon, Subcutaneous Tissue, and Slough after achieving pain control using Lidocaine 4% Topical Solution. No specimens were taken. [name] time out was conducted at 09:55, prior to the start of the procedure. [name] Minimum amount of bleeding was controlled with Pressure. The procedure was tolerated well with [name] pain level of 4 throughout and [name] pain level of 3 following the procedure. Post Debridement Measurements: 7.5cm length x 5cm width x 0.2cm depth; 5.89cm^3 volume. Character of Wound/Ulcer Post Debridement requires further debridement. Severity of Tissue Post Debridement is: Necrosis of muscle. Post procedure Diagnosis Wound #3: Same as Pre-Procedure Plan Follow-up Appointments: Return Appointment in 2 weeks. - Dr. Leanord Hawking Return appointment in 3 weeks. - Dr. Leanord Hawking Return appointment in 1 month. - Dr. Lady Gary Anesthetic: (In clinic) Topical Lidocaine 4% applied to wound bed Bathing/ Shower/ Hygiene: May shower and wash wound with soap and water. Edema Control - Orders / Instructions: Elevate legs to the level of the heart or above for 30 minutes daily and/or when sitting for 3-4 times [name] day throughout the day. Avoid standing for long periods of time. Additional Orders / Instructions: Follow Nutritious Diet - 70-100 grams of protein [name] day - can supplement with protein shakes ensure/premier vitamin C start 500 mg once [name] day and if tolerated then take twice [name] day zinc 30-50 mg once [name] day Home Health: No change in wound care orders this week; continue Home Health for wound care. May utilize formulary equivalent dressing for wound treatment orders unless otherwise specified. Dressing changes to be completed by Home Health on  Monday / Wednesday / Friday except when patient has scheduled visit at Wound Care Center. - ONLY USE MEDIHONEY IF SANYL IS NOT AVAILABLE Other Home Health Orders/Instructions: - [facility] Hyperbaric Oxygen Therapy: Evaluate for HBO Therapy Indication: - Wagner 4 diabetic foot ulcer If appropriate for treatment, begin HBOT per protocol: 2.5 ATA for 90 Minutes with 2 Five (5) Minute Air Breaks T Number of Treatments: - 40 otal One treatments per day (delivered Monday through Friday unless otherwise specified in Special Instructions below): Finger stick Blood Glucose Pre- and Post- HBOT Treatment. Follow Hyperbaric Oxygen Glycemia Protocol Give two 4oz orange juices in addition to Glucerna when the glycemic protocol is used. Afrin (Oxymetazoline HCL) 0.05% nasal spray - 1 spray in both nostrils daily as needed prior to HBO treatment for difficulty clearing ears Radiology ordered were: X-ray, Chest - pre-hyperbaric oxygen therapy Laboratory ordered were: CBC (hemogram) - pre-hyperbaric oxygen therapy WOUND #1: - Foot Wound Laterality: Dorsal, Left Cleanser: Soap and Water 1 x Per Day/30 Days Discharge Instructions: May shower and wash wound with dial antibacterial soap and water prior to dressing change. Cleanser: Wound Cleanser 1 x Per Day/30 Days Discharge Instructions: Cleanse  the wound with wound cleanser prior to applying [name] clean dressing using gauze sponges, not tissue or cotton balls. Prim Dressing: MediHoney Gel, tube 1.5 (oz) 1 x Per Day/30 Days ary Discharge Instructions: IF SANTYL NOT AVAILABLE - Apply to wound bed as instructed Prim Dressing: Santyl Ointment 1 x Per Day/30 Days ary Discharge Instructions: Apply nickel thick amount to wound bed as instructed Secured With: Elastic Bandage 4 inch (ACE bandage) 1 x Per Day/30 Days Discharge Instructions: Secure with ACE bandage as directed. Secured With: [employer], 4.5x3.1 (in/yd) 1 x Per Day/30 Days Discharge  Instructions: Secure with Kerlix as directed. Secured With: Transpore Surgical T ape, 2x10 (in/yd) 1 x Per Day/30 Days Discharge Instructions: Secure dressing with tape as directed. WOUND #2: - Amputation Site - T oe Wound Laterality: Left Cleanser: Soap and Water 1 x Per Day/30 Days Discharge Instructions: May shower and wash wound with dial antibacterial soap and water prior to dressing change. Cleanser: Wound Cleanser 1 x Per Day/30 Days Discharge Instructions: Cleanse the wound with wound cleanser prior to applying [name] clean dressing using gauze sponges, not tissue or cotton balls. Prim Dressing: MediHoney Gel, tube 1.5 (oz) 1 x Per Day/30 Days ary Discharge Instructions: IF SANTYL NOT AVAILABLE - Apply to wound bed as instructed Prim Dressing: Santyl Ointment 1 x Per Day/30 Days ary Discharge Instructions: Apply nickel thick amount to wound bed as instructed Secondary Dressing: ABD Pad, 5x9 1 x Per Day/30 Days Discharge Instructions: Apply over primary dressing as directed. Secondary Dressing: Woven Gauze Sponge, Non-Sterile 4x4 in 1 x Per Day/30 Days Discharge Instructions: Moisten gauze with saline and apply over primary dressing as directed. Secured With: Elastic Bandage 4 inch (ACE bandage) 1 x Per Day/30 Days Discharge Instructions: Secure with ACE bandage as directed. Secured With: [employer], 4.5x3.1 (in/yd) 1 x Per Day/30 Days Discharge Instructions: Secure with Kerlix as directed. Secured With: Transpore Surgical T ape, 2x10 (in/yd) 1 x Per Day/30 Days Discharge Instructions: Secure dressing with tape as directed. Maeder, Ferry [name] ([id number]) [phone].pdf Page 13 of 15 07/29/2023: The dorsal foot wound is essentially unchanged. I can no longer probe to bone at the amputation site. Tendon is exposed on the dorsal aspect of the great toe, but the black eschar has not reaccumulated. Santyl was cost prohibitive so they have been using Medihoney.  We are working on getting him approved for hyperbaric oxygen therapy. I used [name] curette to debride slough and subcutaneous tissue from the dorsal foot wound and the amputation site. I debrided slough, subcutaneous tissue, and nonviable tendon from the great toe. We will continue to apply Santyl here in clinic and they will continue to use Medihoney at home. I have ordered [name] chest x- ray and [name] CBC to complete the hyperbaric oxygen therapy evaluation. Hopefully he will be able to initiate this after the Thanksgiving holiday. He will follow-up in 2 weeks for clinic visit. Electronic Signature(s) Signed: 07/29/2023 10:57:15 AM By: Duanne Guess MD FACS Entered By: Duanne Guess on 07/29/2023 10:57:15 -------------------------------------------------------------------------------- HxROS Details Patient Name: Date of Service: [name] [name], Garvin [name]. 07/29/2023 9:45 [name] Medical Record Number: [id number] Patient Account Number: [id number] Date of Birth/Sex: Treating RN: 1951/01/30 (72 y.o. M) Primary Care Provider: Doreen Beam Other Clinician: Referring Provider: Treating Provider/Extender: Teofilo Pod Weeks in Treatment: 1 Information Obtained From Patient Caregiver Chart Eyes Medical History: Negative for: Cataracts; Glaucoma Ear/Nose/Mouth/Throat Medical History: Negative for: Chronic sinus problems/congestion; Middle ear problems Hematologic/Lymphatic Medical History: Positive  for: Anemia - normocytic Cardiovascular Medical History: Positive for: Hypertension; Peripheral Arterial Disease Past Medical History Notes: Hx: 07/15/23 ABI R 0.68; R TBI absent 07/15/23 ABI L 1.13;L TBI bandage 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 hyperlipidemia Endocrine Medical History: Positive for: Type II Diabetes - Hem A1C 6.7% (06/07/23) Negative for: Type I Diabetes Time with diabetes: since 2021 Treated with: Diet Blood sugar tested every day:  No Genitourinary Medical History: Negative for: End Stage Renal Disease Schulenburg, Alter [name] ([id number]) [id number] Page 14 of 15 Integumentary (Skin) Medical History: Negative for: History of Burn Past Medical History Notes: Gangrene Left foot (toes) Musculoskeletal Medical History: Past Medical History Notes: Lumbar stenosis with neurogenic claudication Neurologic Medical History: Positive for: Neuropathy Oncologic Medical History: Negative for: Received Chemotherapy; Received Radiation Psychiatric Medical History: Positive for: Confinement Anxiety Negative for: Anorexia/bulimia Immunizations Pneumococcal Vaccine: Received Pneumococcal Vaccination: Yes Received Pneumococcal Vaccination On or After 60th Birthday: Yes Implantable Devices No devices added Hospitalization / Surgery History Type of Hospitalization/Surgery left fem-pop [facility] Family and Social History Cancer: Yes - Father; Diabetes: Yes - Mother; Heart Disease: Yes - Paternal Grandparents; Hereditary Spherocytosis: No; Hypertension: Yes - Father,Mother; Kidney Disease: No; Lung Disease: No; Seizures: No; Stroke: Yes - Mother; Thyroid Problems: No; Tuberculosis: No; Former smoker - quit 06/01/2023 - ended on 06/01/2023; Marital Status - Married; Alcohol Use: Rarely; Drug Use: No History; Caffeine Use: Moderate; Financial Concerns: No; Food, Clothing or Shelter Needs: No; Support System Lacking: No; Transportation Concerns: Yes - concerned about transportation from [employer]) Signed: 07/29/2023 12:30:01 PM By: Duanne Guess MD FACS Entered By: Duanne Guess on 07/29/2023 10:54:08 -------------------------------------------------------------------------------- SuperBill Details Patient Name: Date of Service: [name] [name], Ladarrell [name]. 07/29/2023 Medical Record Number: [id number] Patient Account Number: [id number] Date of Birth/Sex: Treating RN: [birth year] (72 y.o.  M) Primary Care Provider: Doreen Beam Other Clinician: Referring Provider: Treating Provider/Extender: Vonzella Nipple, Dhruv Weeks in Treatment: 1 Diagnosis Coding ICD-10 Codes Code Description [phone] Non-pressure chronic ulcer of other part of left foot with bone involvement without evidence of necrosis L97.523 Non-pressure chronic ulcer of other part of left foot with necrosis of muscle Magnone, Daxter [name] ([id number]) [id number] Page 15 of 15 [phone] Non-pressure chronic ulcer of other 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 E11.621 Type 2 diabetes mellitus with foot ulcer I73.9 Peripheral vascular disease, unspecified I70.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 Facility Procedures : CPT4 Code: [id number] Description: 11042 - DEB SUBQ TISSUE 20 SQ CM/< ICD-10 Diagnosis Description L97.522 Non-pressure chronic ulcer of other part of left foot with fat layer exposed Modifier: Quantity: 1 : CPT4 Code: [id number] Description: 11043 - DEB MUSC/FASCIA 20 SQ CM/< ICD-10 Diagnosis Description L97.523 Non-pressure chronic ulcer of other part of left foot with necrosis of muscle 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 with Modifier: out evidence of necr Quantity: 1 osis : CPT4 Code: [id number] Description: 11046 - DEB MUSC/FASCIA EA ADDL 20 CM ICD-10 Diagnosis Description L97.523 Non-pressure chronic ulcer of other part of left foot with necrosis of muscle 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 with Modifier: out evidence of necr Quantity: 1 [facility] Procedures : CPT4 Code Description Modifier [id number] - WC PHYS LEVEL 4 - EST PT ICD-10 Diagnosis Description L97.526 Non-pressure chronic ulcer of other part of left  foot with bone  involvement without evidence of necro 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 Quantity: 1 sis : [id number] [id number] - WC PHYS SUBQ TISS 20 SQ CM ICD-10 Diagnosis Description L97.522 Non-pressure chronic ulcer of other part of left foot with fat layer exposed Quantity: 1 : [id number] 11043 - WC PHYS DEBR MUSCLE/FASCIA 20 SQ CM ICD-10 Diagnosis Description 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

## 2023-07-29 NOTE — Progress Notes (Signed)
Start, [name] [name] ([id number]) [id number].pdf Page 1 of 11 Visit Report for 07/29/2023 Arrival Information Details Patient Name: Date of Service: [name] [name], [name] [name] 07/29/2023 9:45 [name] Medical Record Number: [id number] Patient Account Number: [id number] Date of Birth/Sex: Treating RN: 12-31-1950 (72 y.o. M) Primary Care [name] [name]: [name] [name] Other Clinician: Referring [name] [name]: Treating [name] [name]/Extender: [name] [name]: 1 Visit Information History Since Last Visit Added or deleted any medications: No Patient Arrived: Walker Any new allergies or adverse reactions: No Arrival Time: 09:30 Had [name] fall or experienced change in No Accompanied By: wife activities of daily living that may affect Transfer Assistance: None risk of falls: Patient Identification Verified: Yes Signs or symptoms of abuse/neglect since last visito No Secondary Verification Process Completed: Yes Hospitalized since last visit: No Patient Requires Transmission-Based Precautions: No Implantable device outside of the clinic excluding No Patient Has Alerts: Yes cellular tissue based products placed in the center Patient Alerts: ABI R 0.68 (07/15/23) since last visit: ABI L 1.13 (07/15/23) Pain Present Now: No Electronic Signature(s) Signed: 07/29/2023 10:35:53 AM By: [name] [name] Entered By: [name] [name] on 07/29/2023 09:30:53 -------------------------------------------------------------------------------- Encounter Discharge Information Details Patient Name: Date of Service: [name] [name], [name] [name]. 07/29/2023 9:45 [name] Medical Record Number: [id number] Patient Account Number: [id number] Date of Birth/Sex: Treating RN: 1951/08/25 (72 y.o. [name] [name] Primary Care [name] [name]: [name] [name] Other Clinician: Referring Deshawn Skelley: Treating Kaydynce Pat/Extender: [name] [name]: 1 Encounter Discharge Information Items Post Procedure  Vitals Discharge Condition: Stable Temperature (F): 98.3 Ambulatory Status: Walker Pulse (bpm): 89 Discharge Destination: Home Respiratory Rate (breaths/min): 18 Transportation: Private Auto Blood Pressure (mmHg): 121/52 Accompanied By: spouse Schedule Follow-up Appointment: Yes Clinical Summary of Care: Patient Declined Electronic Signature(s) Signed: 07/29/2023 1:02:55 PM By: [name] [name] Entered By: [name] Deed on 07/29/2023 12:36:21 [name], [name] [name] ([id number]) [id number] Page 2 of 11 -------------------------------------------------------------------------------- Lower Extremity Assessment Details Patient Name: Date of Service: [name] [name], [name] [name]. 07/29/2023 9:45 [name] Medical Record Number: [id number] Patient Account Number: [id number] Date of Birth/Sex: Treating RN: April 25, 1951 (72 y.o. [name] [name] Primary Care Javeria Briski: [name] [name] Other Clinician: Referring [name] [name]: Treating Dondre Catalfamo/Extender: [name] [name], [name] [name]: 1 Edema Assessment Assessed: [Left: No] [Right: No] Edema: [Left: Ye] [Right: s] Calf Left: Right: Point of Measurement: From Medial Instep 41 cm Ankle Left: Right: Point of Measurement: From Medial Instep 30.5 cm Vascular Assessment Extremity colors, hair growth, and conditions: Extremity Color: [Left:Normal] Hair Growth on Extremity: [Left:No] Temperature of Extremity: [Left:Warm < 3 seconds] Electronic Signature(s) Signed: 07/29/2023 12:12:43 PM By: Samuella [name] Entered By: Samuella [name] on 07/29/2023 09:37:30 -------------------------------------------------------------------------------- Multi Wound Chart Details Patient Name: Date of Service: [name] [name], [name] [name]. 07/29/2023 9:45 [name] Medical Record Number: [id number] Patient Account Number: [id number] Date of Birth/Sex: Treating RN: [birth year] (72 y.o. M) Primary Care [name] [name]: [name] [name] Other  Clinician: Referring [name] [name]: Treating [name] [name]/Extender: [name] [name], [name] [name]: 1 Vital Signs Height(in): 68 Pulse(bpm): 89 Weight(lbs): 198 Blood Pressure(mmHg): 121/52 Body Mass Index(BMI): 30.1 Temperature(F): 98.3 Respiratory Rate(breaths/min): 20 [1:Photos:] [id number] Page 3 of 11] Left, Dorsal Foot Left Amputation Site - Toe Left T Great oe Wound Location: Gradually Appeared Surgical Injury Gradually Appeared Wounding Event: Diabetic Wound/Ulcer of the Lower Diabetic Wound/Ulcer of the Lower Diabetic Wound/Ulcer of the Lower Primary Etiology: Extremity Extremity Extremity N/[name] Arterial Insufficiency Ulcer Secondary Etiology: Anemia, Hypertension, Peripheral Anemia, Hypertension, Peripheral Anemia, Hypertension, Peripheral Comorbid History: Arterial Disease, Type II Diabetes,  Arterial Disease, Type II Diabetes, Arterial Disease, Type II Diabetes, Neuropathy, Confinement Anxiety Neuropathy, Confinement Anxiety Neuropathy, Confinement Anxiety 06/02/2023 06/02/2023 06/07/2023 Date Acquired: 1 1 1  Weeks of [name]: Open Open Open Wound Status: No No No Wound Recurrence: 0.5x0.6x0.1 5.7x2.5x0.8 7.5x5x0.2 Measurements L x W x D (cm) 0.236 11.192 29.452 [name] (cm) : rea 0.024 8.954 5.89 Volume (cm) : 49.90% -14.00% -66.70% % Reduction in [name] rea: 48.90% -82.40% -233.30% % Reduction in Volume: Grade 2 Grade 3 Grade 4 Classification: Medium Medium Medium Exudate [name] mount: Serous Serosanguineous Serosanguineous Exudate Type: amber red, brown red, brown Exudate Color: Flat and Intact Flat and Intact Flat and Intact Wound Margin: Small (1-33%) Small (1-33%) Medium (34-66%) Granulation [name] mount: Pink Red Red, Pink Granulation Quality: Large (67-100%) Large (67-100%) Medium (34-66%) Necrotic [name] mount: Adherent Slough Adherent [employer], Adherent Slough Necrotic Tissue: Fat Layer (Subcutaneous Tissue): Yes  Fat Layer (Subcutaneous Tissue): Yes Fat Layer (Subcutaneous Tissue): Yes Exposed Structures: Fascia: No Bone: Yes Fascia: No Tendon: No Fascia: No Tendon: No Muscle: No Tendon: No Muscle: No Joint: No Muscle: No Joint: No Bone: No Joint: No Bone: No Small (1-33%) Small (1-33%) Small (1-33%) Epithelialization: Debridement - Excisional Debridement - Excisional Debridement - Excisional Debridement: Pre-procedure Verification/Time Out 09:55 09:55 09:55 Taken: Lidocaine 4% Topical Solution Lidocaine 4% Topical Solution Lidocaine 4% Topical Solution Pain Control: Subcutaneous, Slough Subcutaneous, Slough Tendon, Subcutaneous, Slough Tissue Debrided: Skin/Subcutaneous Tissue Skin/Subcutaneous Tissue Skin/Subcutaneous Tissue/Muscle Level: 0.24 11.19 29.44 Debridement [name] (sq cm): rea Curette Curette Curette Instrument: Minimum Minimum Minimum Bleeding: Pressure Pressure Pressure Hemostasis [name] chieved: 4 4 4  Procedural Pain: 3 3 3  Post Procedural Pain: Procedure was tolerated well Procedure was tolerated well Procedure was tolerated well Debridement [name] Response: 0.5x0.6x0.1 5.7x2.5x0.8 7.5x5x0.2 Post Debridement Measurements L x W x D (cm) 0.024 8.954 5.89 Post Debridement Volume: (cm) No Abnormalities Noted No Abnormalities Noted No Abnormalities Noted Periwound Skin Texture: Dry/Scaly: Yes Dry/Scaly: Yes Dry/Scaly: Yes Periwound Skin Moisture: No Abnormalities Noted No Abnormalities Noted No Abnormalities Noted Periwound Skin Color: No Abnormality No Abnormality No Abnormality Temperature: Yes Yes Yes Tenderness on Palpation: Debridement Debridement Debridement Procedures Performed: [name] Notes Electronic Signature(s) Signed: 07/29/2023 10:52:46 AM By: Duanne Guess MD FACS Entered By: Duanne Guess on 07/29/2023 10:52:46 -------------------------------------------------------------------------------- Multi-Disciplinary Care Plan  Details Patient Name: Date of Service: [name] [name], Kayd [name]. 07/29/2023 9:45 [name] Medical Record Number: [id number] Patient Account Number: [id number] Date of Birth/Sex: Treating RN: 27-Oct-1950 (72 y.o. 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 and [name] procedures Discomfort related to temperature and humidity changes inside hyperbaric chamber Potential for barotraumas to ears, sinuses, teeth, and lungs or cerebral gas embolism related to changes in atmospheric pressure inside hyperbaric oxygen chamber Potential for oxygen toxicity seizures related to delivery of 100% oxygen at an increased atmospheric pressure Potential for pulmonary oxygen toxicity related to delivery of 100% oxygen at an increased atmospheric pressure Goals: Barotrauma will be prevented during HBO2 Date Initiated: 07/22/2023 T arget Resolution Date: 09/09/2023 Goal Status: Active Patient and/or family will be able to state/discuss factors appropriate to the management of their disease process during [name] Date Initiated: 07/22/2023 T arget Resolution Date: 09/09/2023 Goal Status: Active Patient will tolerate the hyperbaric  oxygen therapy [name] Date Initiated: 07/22/2023 T arget Resolution Date: 09/10/2023 Goal Status: Active Patient will tolerate the internal climate of the chamber Date Initiated: 07/22/2023 T arget Resolution Date: 09/10/2023 Goal Status: Active Patient/caregiver will verbalize understanding of HBO goals, rationale, procedures and potential hazards Date Initiated: 07/22/2023 T arget Resolution  Date: 09/09/2023 Goal Status: Active Signs and symptoms of pulmonary oxygen toxicity will be recognized and promptly addressed Date Initiated: 07/22/2023 T arget Resolution Date: 09/09/2023 Goal Status: Active Interventions: Administer [name] five (5) minute air break for patient if signs and symptoms of seizure appear and notify the hyperbaric physician Administer decongestants, per physician orders, prior to HBO2 Administer the correct therapeutic gas delivery based on the patients needs and limitations, per physician order Assess and provide for patients comfort related to the hyperbaric environment and equalization of middle ear Assess for signs and symptoms related to adverse events, including but not limited to confinement anxiety, pneumothorax, oxygen toxicity and baurotrauma Assess patient for any history of confinement anxiety Assess patient's knowledge and expectations regarding hyperbaric medicine and provide education related to the hyperbaric environment, goals of [name] and prevention of adverse events Implement protocols to decrease risk of pneumothorax in high risk patients Notes: Necrotic Tissue Nursing Diagnoses: Impaired tissue integrity related to necrotic/devitalized tissue Knowledge deficit related to management of necrotic/devitalized tissue Goals: Necrotic/devitalized tissue will be minimized in the wound bed Date Initiated: 07/22/2023 Target Resolution Date: 09/09/2023 Goal Status: Active Patient/caregiver will verbalize understanding of reason and process for debridement of necrotic tissue Date Initiated: 07/22/2023 Target Resolution Date: 09/09/2023 Goal Status: Active Interventions: Assess patient pain level pre-, during and post procedure and prior to discharge Provide education on necrotic tissue and debridement process [name] Activities: Apply topical anesthetic as ordered : 07/22/2023 Notes: Wound/Skin Impairment Nursing Diagnoses: Impaired tissue  integrity Manseau, Azael [name] ([id number]) [id number] Page 5 of 11 Knowledge deficit related to ulceration/compromised skin integrity Goals: Patient/caregiver will verbalize understanding of skin care regimen Date Initiated: 07/22/2023 Target Resolution Date: 09/09/2023 Goal Status: Active Interventions: Assess ulceration(s) every visit [name] Activities: Skin care regimen initiated : 07/22/2023 Topical wound management initiated : 07/22/2023 Notes: Electronic Signature(s) Signed: 07/29/2023 1:03:33 PM By: Karie Schwalbe RN Entered By: Karie Schwalbe on 07/29/2023 10:40:11 -------------------------------------------------------------------------------- Pain Assessment Details Patient Name: Date of Service: [name] [name], [name] [name]. 07/29/2023 9:45 [name] Medical Record Number: [id number] Patient Account Number: [id number] Date of Birth/Sex: Treating RN: 29-Dec-1950 (72 y.o. M) Primary Care Demeco Ducksworth: [name] [name] Other Clinician: Referring Nery Kalisz: Treating [place] Want/Extender: [name] [name], [name] [name]: 1 Active Problems Location of Pain Severity and Description of Pain Patient Has Paino No Site Locations Pain Management and Medication Current Pain Management: Electronic Signature(s) Signed: 07/29/2023 10:35:53 AM By: [name] [name] Entered By: [name] [name] on 07/29/2023 09:31:18 [place], Damain [name] ([id number]) [id number] Page 6 of 11 -------------------------------------------------------------------------------- Patient/Caregiver Education Details Patient Name: Date of Service: KAMEL, DUNMAN 11/22/2024andnbsp9:45 [name] Medical Record Number: [id number] Patient Account Number: [id number] Date of Birth/Gender: Treating RN: [birth year] (72 y.o. [name] Primary Care Physician: [name] [name] Other Clinician: Referring Physician: Treating Physician/Extender: [name] [name]:  1 Education Assessment Education Provided To: Patient Education Topics Provided Wound/Skin Impairment: Methods: Explain/Verbal Responses: State content correctly Electronic Signature(s) Signed: 07/29/2023 1:03:33 PM By: Karie Schwalbe RN Entered By: Karie Schwalbe on 07/29/2023 10:40:24 -------------------------------------------------------------------------------- Wound Assessment Details Patient Name: Date of Service: [name] Salvage [name]. 07/29/2023 9:45 [name] Medical Record Number: [id number] Patient Account Number: [id number] Date of  Birth/Sex: Treating RN: December 20, 1950 (72 y.o. [name] [name] Primary Care Jabarri Stefanelli: [name] [name] Other Clinician: Referring Toyia Jelinek: Treating Tyleigh Mahn/Extender: [name] [name], [name] [name]: 1 Wound Status Wound Number: 1 Primary Diabetic Wound/Ulcer of the Lower Extremity Etiology: Wound Location: Left, Dorsal Foot Wound Open Wounding Event: Gradually Appeared Status: Date Acquired: 06/02/2023 Comorbid Anemia, Hypertension, Peripheral Arterial Disease, Type II Weeks Of [name]: 1 History: Diabetes, Neuropathy, Confinement Anxiety Clustered Wound: No Photos Wound Measurements Length: (cm) 0.5 Width: (cm) 0.6 [place], Misael [name] ([id number]) Depth: (cm) 0.1 Area: (cm) 0.236 Volume: (cm) 0.024 % Reduction in Area: 49.9% % Reduction in Volume: 48.9% [id number] Page 7 of 11 Epithelialization: Small (1-33%) Tunneling: No Undermining: No Wound Description Classification: Grade 2 Wound Margin: Flat and Intact Exudate Amount: Medium Exudate Type: Serous Exudate Color: amber Foul Odor After Cleansing: No Slough/Fibrino Yes Wound Bed Granulation Amount: Small (1-33%) Exposed Structure Granulation Quality: Pink Fascia Exposed: No Necrotic Amount: Large (67-100%) Fat Layer (Subcutaneous Tissue) Exposed: Yes Necrotic Quality: Adherent Slough Tendon Exposed: No Muscle Exposed: No Joint  Exposed: No Bone Exposed: No Periwound Skin Texture Texture Color No Abnormalities Noted: Yes No Abnormalities Noted: Yes Moisture Temperature / Pain No Abnormalities Noted: No Temperature: No Abnormality Dry / Scaly: Yes Tenderness on Palpation: Yes [name] Notes Wound #1 (Foot) Wound Laterality: Dorsal, Left Cleanser Soap and Water Discharge Instruction: May shower and wash wound with dial antibacterial soap and water prior to dressing change. Wound Cleanser Discharge Instruction: Cleanse the wound with wound cleanser prior to applying [name] clean dressing using gauze sponges, not tissue or cotton balls. Peri-Wound Care Topical Primary Dressing MediHoney Gel, tube 1.5 (oz) Discharge Instruction: IF SANTYL NOT AVAILABLE - Apply to wound bed as instructed Santyl Ointment Discharge Instruction: Apply nickel thick amount to wound bed as instructed Secondary Dressing Secured With Elastic Bandage 4 inch (ACE bandage) Discharge Instruction: Secure with ACE bandage as directed. Kerlix Roll Sterile, 4.5x3.1 (in/yd) Discharge Instruction: Secure with Kerlix as directed. Transpore Surgical Tape, 2x10 (in/yd) Discharge Instruction: Secure dressing with tape as directed. Compression Wrap Compression Stockings Add-Ons Electronic Signature(s) Signed: 07/29/2023 10:35:53 AM By: [name] [name] Signed: 07/29/2023 12:12:43 PM By: Samuella [name] Entered By: [name] [name] on 07/29/2023 09:41:04 [name] [name], [name] [name] ([id number]) [id number] Page 8 of 11 -------------------------------------------------------------------------------- Wound Assessment Details Patient Name: Date of Service: [name] [name], [name] [name]. 07/29/2023 9:45 [name] Medical Record Number: [id number] Patient Account Number: [id number] Date of Birth/Sex: Treating RN: March 08, 1951 (72 y.o. 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 Location: Left Amputation Site - Toe Wound Open Wounding Event: Surgical Injury Status: Date Acquired: 06/02/2023 Comorbid Anemia, Hypertension, Peripheral Arterial Disease, Type II Weeks Of [name]: 1 History: Diabetes, Neuropathy, Confinement Anxiety Clustered Wound: No Photos Wound Measurements Length: (cm) 5 Width: (cm) 2 Depth: (cm) 0 Area: (cm) Volume: (cm) .7 % Reduction in Area: -14% .5 % Reduction in Volume: -82.4% .8 Epithelialization: Small (1-33%) 11.192 Tunneling: No 8.954 Undermining: No Wound Description Classification: Grade 3 Wound Margin: Flat and Intact Exudate Amount: Medium Exudate Type: Serosanguineous Exudate Color: red, brown Foul Odor After Cleansing: No Slough/Fibrino Yes Wound Bed Granulation Amount: Small (1-33%) Exposed Structure Granulation Quality: Red Fascia Exposed: No Necrotic Amount: Large (67-100%) Fat Layer (Subcutaneous Tissue) Exposed: Yes Necrotic Quality: Adherent Slough Tendon Exposed: No Muscle Exposed: No Joint Exposed: No Bone Exposed: Yes  Periwound Skin Texture Texture Color No Abnormalities Noted: Yes No Abnormalities Noted: Yes Moisture Temperature / Pain No Abnormalities Noted: No Temperature: No Abnormality Dry / Scaly: Yes Tenderness on Palpation: Yes [name] Notes Wound #2 (Amputation Site - Toe) Wound Laterality: Left Cleanser Soap and Water Ashby, Jesiel [name] ([id number]) [id number] Page 9 of 11 Discharge Instruction: May shower and wash wound with dial antibacterial soap and water prior to dressing change. Wound Cleanser Discharge Instruction: Cleanse the wound with wound cleanser prior to applying [name] clean dressing using gauze sponges, not tissue or cotton balls. Peri-Wound Care Topical Primary Dressing MediHoney Gel, tube 1.5  (oz) Discharge Instruction: IF SANTYL NOT AVAILABLE - Apply to wound bed as instructed Santyl Ointment Discharge Instruction: Apply nickel thick amount to wound bed as instructed Secondary Dressing ABD Pad, 5x9 Discharge Instruction: Apply over primary dressing as directed. Woven Gauze Sponge, Non-Sterile 4x4 in Discharge Instruction: Moisten gauze with saline and apply over primary dressing as directed. Secured With Elastic Bandage 4 inch (ACE bandage) Discharge Instruction: Secure with ACE bandage as directed. Kerlix Roll Sterile, 4.5x3.1 (in/yd) Discharge Instruction: Secure with Kerlix as directed. Transpore Surgical Tape, 2x10 (in/yd) Discharge Instruction: Secure dressing with tape as directed. Compression Wrap Compression Stockings Add-Ons Electronic Signature(s) Signed: 07/29/2023 10:35:53 AM By: [name] [name] Signed: 07/29/2023 12:12:43 PM By: Samuella [name] Entered By: [name] [name] on 07/29/2023 09:41:38 -------------------------------------------------------------------------------- Wound Assessment Details Patient Name: Date of Service: [name] [name], [name] [name]. 07/29/2023 9:45 [name] Medical Record Number: [id number] Patient Account Number: [id number] Date of Birth/Sex: Treating RN: 1951-02-02 (72 y.o. [name] [name] Primary Care Sofiya Ezelle: [name] [name] Other Clinician: Referring Rutherford Alarie: Treating Denasia Venn/Extender: [name] [name], [name] [name]: 1 Wound Status Wound Number: 3 Primary Diabetic Wound/Ulcer of the Lower Extremity Etiology: Wound Location: Left T Great oe Secondary Arterial Insufficiency Ulcer Wounding Event: Gradually Appeared Etiology: Date Acquired: 06/07/2023 Wound Open Weeks Of [name]: 1 Status: Clustered Wound: No Comorbid Anemia, Hypertension, Peripheral Arterial Disease, Type II History: Diabetes, Neuropathy, Confinement Anxiety Photos [name] [name], [name] [name] ([id number]) [id number] Page 10 of  11 Wound Measurements Length: (cm) 7.5 Width: (cm) 5 Depth: (cm) 0.2 Area: (cm) 29.452 Volume: (cm) 5.89 % Reduction in Area: -66.7% % Reduction in Volume: -233.3% Epithelialization: Small (1-33%) Tunneling: No Undermining: No Wound Description Classification: Grade 4 Wound Margin: Flat and Intact Exudate Amount: Medium Exudate Type: Serosanguineous Exudate Color: red, brown Foul Odor After Cleansing: No Slough/Fibrino Yes Wound Bed Granulation Amount: Medium (34-66%) Exposed Structure Granulation Quality: Red, Pink Fascia Exposed: No Necrotic Amount: Medium (34-66%) Fat Layer (Subcutaneous Tissue) Exposed: Yes Necrotic Quality: Eschar, Adherent Slough Tendon Exposed: No Muscle Exposed: No Joint Exposed: No Bone Exposed: No Periwound Skin Texture Texture Color No Abnormalities Noted: Yes No Abnormalities Noted: Yes Moisture Temperature / Pain No Abnormalities Noted: No Temperature: No Abnormality Dry / Scaly: Yes Tenderness on Palpation: Yes [name] Notes Wound #3 (Toe Great) Wound Laterality: Left Cleanser Peri-Wound Care Topical Primary Dressing Secondary Dressing Secured With Compression Wrap Compression Stockings Add-Ons Electronic Signature(s) Signed: 07/29/2023 10:35:53 AM By: [name] [name] Signed: 07/29/2023 12:12:43 PM By: Samuella [name] Entered By: [name] [name] on 07/29/2023 09:42:03 [name] [name], [name] [name] ([id number]) [id number] Page 11 of 11 -------------------------------------------------------------------------------- Vitals Details Patient Name: Date of Service: [name] [name], [name] [name]. 07/29/2023 9:45 [name] Medical Record Number: [id number] Patient Account Number: [id number] Date of Birth/Sex: Treating RN: 29-Jun-1951 (72 y.o. M) Primary Care Allayna Erlich: [name] [name] Other Clinician: Referring Malakhi Markwood: Treating Rilley Stash/Extender: [name] [name], [name] [name]: 1 Vital Signs  Time Taken: 09:31 Temperature  (F): 98.3 Height (in): 68 Pulse (bpm): 89 Weight (lbs): 198 Respiratory Rate (breaths/min): 20 Body Mass Index (BMI): 30.1 Blood Pressure (mmHg): 121/52 Reference Range: 80 - 120 mg / dl Electronic Signature(s) Signed: 07/29/2023 10:35:53 AM By: [name] [name] Entered By: [name] [name] on 07/29/2023 09:32:01

## 2023-08-06 ENCOUNTER — Other Ambulatory Visit: Payer: Self-pay | Admitting: Student

## 2023-08-06 ENCOUNTER — Encounter (HOSPITAL_COMMUNITY): Payer: Self-pay

## 2023-08-06 ENCOUNTER — Inpatient Hospital Stay (HOSPITAL_COMMUNITY)
Admit: 2023-08-06 | Discharge: 2023-08-09 | DRG: 378 | Disposition: A | Discharge status: Home Health | Payer: Medicare HMO | Attending: Internal Medicine | Admitting: Internal Medicine

## 2023-08-06 DIAGNOSIS — B3781 Candidal esophagitis: Secondary | ICD-10-CM | POA: Diagnosis not present

## 2023-08-06 DIAGNOSIS — D123 Benign neoplasm of transverse colon: Secondary | ICD-10-CM | POA: Diagnosis present

## 2023-08-06 DIAGNOSIS — D62 Acute posthemorrhagic anemia: Secondary | ICD-10-CM | POA: Diagnosis not present

## 2023-08-06 DIAGNOSIS — R079 Chest pain, unspecified: Secondary | ICD-10-CM | POA: Diagnosis not present

## 2023-08-06 DIAGNOSIS — K3189 Other diseases of stomach and duodenum: Secondary | ICD-10-CM | POA: Diagnosis not present

## 2023-08-06 DIAGNOSIS — I739 Peripheral vascular disease, unspecified: Secondary | ICD-10-CM | POA: Diagnosis not present

## 2023-08-06 DIAGNOSIS — K31A0 Gastric intestinal metaplasia, unspecified: Secondary | ICD-10-CM | POA: Diagnosis not present

## 2023-08-06 DIAGNOSIS — I2489 Other forms of acute ischemic heart disease: Secondary | ICD-10-CM | POA: Diagnosis not present

## 2023-08-06 DIAGNOSIS — B379 Candidiasis, unspecified: Secondary | ICD-10-CM | POA: Diagnosis not present

## 2023-08-06 DIAGNOSIS — K922 Gastrointestinal hemorrhage, unspecified: Secondary | ICD-10-CM | POA: Diagnosis not present

## 2023-08-06 DIAGNOSIS — I10 Essential (primary) hypertension: Secondary | ICD-10-CM | POA: Diagnosis present

## 2023-08-06 DIAGNOSIS — I509 Heart failure, unspecified: Secondary | ICD-10-CM | POA: Diagnosis not present

## 2023-08-06 DIAGNOSIS — K635 Polyp of colon: Secondary | ICD-10-CM | POA: Diagnosis not present

## 2023-08-06 DIAGNOSIS — Q438 Other specified congenital malformations of intestine: Secondary | ICD-10-CM | POA: Diagnosis not present

## 2023-08-06 DIAGNOSIS — E119 Type 2 diabetes mellitus without complications: Secondary | ICD-10-CM | POA: Diagnosis not present

## 2023-08-06 DIAGNOSIS — J811 Chronic pulmonary edema: Secondary | ICD-10-CM | POA: Diagnosis not present

## 2023-08-06 DIAGNOSIS — D128 Benign neoplasm of rectum: Secondary | ICD-10-CM | POA: Diagnosis not present

## 2023-08-06 DIAGNOSIS — K648 Other hemorrhoids: Secondary | ICD-10-CM | POA: Diagnosis not present

## 2023-08-06 DIAGNOSIS — K5520 Angiodysplasia of colon without hemorrhage: Secondary | ICD-10-CM | POA: Diagnosis not present

## 2023-08-06 DIAGNOSIS — R531 Weakness: Secondary | ICD-10-CM | POA: Diagnosis present

## 2023-08-06 DIAGNOSIS — E1151 Type 2 diabetes mellitus with diabetic peripheral angiopathy without gangrene: Secondary | ICD-10-CM | POA: Diagnosis not present

## 2023-08-06 DIAGNOSIS — Z91041 Radiographic dye allergy status: Secondary | ICD-10-CM

## 2023-08-06 DIAGNOSIS — D649 Anemia, unspecified: Secondary | ICD-10-CM | POA: Diagnosis not present

## 2023-08-06 DIAGNOSIS — Z823 Family history of stroke: Secondary | ICD-10-CM

## 2023-08-06 DIAGNOSIS — R7989 Other specified abnormal findings of blood chemistry: Secondary | ICD-10-CM | POA: Diagnosis not present

## 2023-08-06 DIAGNOSIS — Z8249 Family history of ischemic heart disease and other diseases of the circulatory system: Secondary | ICD-10-CM

## 2023-08-06 DIAGNOSIS — K2289 Other specified disease of esophagus: Secondary | ICD-10-CM | POA: Diagnosis not present

## 2023-08-06 DIAGNOSIS — Z7902 Long term (current) use of antithrombotics/antiplatelets: Secondary | ICD-10-CM

## 2023-08-06 DIAGNOSIS — K921 Melena: Secondary | ICD-10-CM | POA: Diagnosis not present

## 2023-08-06 DIAGNOSIS — K2950 Unspecified chronic gastritis without bleeding: Secondary | ICD-10-CM | POA: Diagnosis not present

## 2023-08-06 DIAGNOSIS — I70222 Atherosclerosis of native arteries of extremities with rest pain, left leg: Secondary | ICD-10-CM | POA: Diagnosis present

## 2023-08-06 DIAGNOSIS — M1990 Unspecified osteoarthritis, unspecified site: Secondary | ICD-10-CM | POA: Diagnosis present

## 2023-08-06 DIAGNOSIS — Z7982 Long term (current) use of aspirin: Secondary | ICD-10-CM | POA: Diagnosis not present

## 2023-08-06 DIAGNOSIS — K2980 Duodenitis without bleeding: Secondary | ICD-10-CM | POA: Diagnosis not present

## 2023-08-06 DIAGNOSIS — R609 Edema, unspecified: Secondary | ICD-10-CM | POA: Diagnosis not present

## 2023-08-06 DIAGNOSIS — F17210 Nicotine dependence, cigarettes, uncomplicated: Secondary | ICD-10-CM | POA: Diagnosis not present

## 2023-08-06 DIAGNOSIS — E1140 Type 2 diabetes mellitus with diabetic neuropathy, unspecified: Secondary | ICD-10-CM | POA: Diagnosis not present

## 2023-08-06 DIAGNOSIS — K219 Gastro-esophageal reflux disease without esophagitis: Secondary | ICD-10-CM | POA: Diagnosis present

## 2023-08-06 DIAGNOSIS — Z79899 Other long term (current) drug therapy: Secondary | ICD-10-CM

## 2023-08-06 DIAGNOSIS — Z87891 Personal history of nicotine dependence: Secondary | ICD-10-CM | POA: Diagnosis not present

## 2023-08-06 DIAGNOSIS — E785 Hyperlipidemia, unspecified: Secondary | ICD-10-CM | POA: Diagnosis not present

## 2023-08-06 DIAGNOSIS — R0600 Dyspnea, unspecified: Secondary | ICD-10-CM | POA: Diagnosis not present

## 2023-08-06 DIAGNOSIS — K5521 Angiodysplasia of colon with hemorrhage: Secondary | ICD-10-CM | POA: Diagnosis not present

## 2023-08-06 DIAGNOSIS — Z89422 Acquired absence of other left toe(s): Secondary | ICD-10-CM

## 2023-08-06 DIAGNOSIS — R0602 Shortness of breath: Secondary | ICD-10-CM | POA: Diagnosis not present

## 2023-08-06 DIAGNOSIS — I2510 Atherosclerotic heart disease of native coronary artery without angina pectoris: Secondary | ICD-10-CM | POA: Diagnosis not present

## 2023-08-06 DIAGNOSIS — K2991 Gastroduodenitis, unspecified, with bleeding: Secondary | ICD-10-CM | POA: Diagnosis not present

## 2023-08-06 DIAGNOSIS — K209 Esophagitis, unspecified without bleeding: Secondary | ICD-10-CM | POA: Diagnosis not present

## 2023-08-06 DIAGNOSIS — I110 Hypertensive heart disease with heart failure: Secondary | ICD-10-CM | POA: Diagnosis not present

## 2023-08-06 LAB — TROPONIN I (HIGH SENSITIVITY)
Troponin I (High Sensitivity): 10 ng/L (ref <18)
Troponin I (High Sensitivity): 10 ng/L (ref <18)

## 2023-08-06 LAB — HEMOGLOBIN AND HEMATOCRIT, BLOOD
HCT: 15.4 % — ABNORMAL LOW (ref 39.0–52.0)
Hemoglobin: 4.9 g/dL — CL (ref 13.0–17.0)

## 2023-08-06 LAB — PREPARE RBC (CROSSMATCH)

## 2023-08-06 MED ORDER — ACETAMINOPHEN 325 MG PO TABS
650.0000 mg | ORAL_TABLET | Freq: Four times a day (QID) | ORAL | Status: DC | PRN
  Administered 2023-08-06: 650 mg via ORAL
  Filled 2023-08-06: qty 2

## 2023-08-06 MED ORDER — PANTOPRAZOLE SODIUM 40 MG IV SOLR
40.0000 mg | Freq: Two times a day (BID) | INTRAVENOUS | Status: DC
  Administered 2023-08-06 – 2023-08-08 (×5): 40 mg via INTRAVENOUS
  Filled 2023-08-06 (×5): qty 10

## 2023-08-06 MED ORDER — ACETAMINOPHEN 650 MG RE SUPP: 650.0000 mg | Freq: Four times a day (QID) | RECTAL | Status: DC | PRN

## 2023-08-06 MED ORDER — ZINC SULFATE 220 (50 ZN) MG PO CAPS
220.0000 mg | ORAL_CAPSULE | Freq: Every day | ORAL | Status: DC
  Administered 2023-08-07 – 2023-08-09 (×3): 220 mg via ORAL
  Filled 2023-08-06 (×3): qty 1

## 2023-08-06 MED ORDER — ATORVASTATIN CALCIUM 40 MG PO TABS
40.0000 mg | ORAL_TABLET | Freq: Every day | ORAL | Status: DC
  Administered 2023-08-06 – 2023-08-08 (×3): 40 mg via ORAL
  Filled 2023-08-06 (×3): qty 1

## 2023-08-06 MED ORDER — HYDROCODONE-ACETAMINOPHEN 5-325 MG PO TABS
1.0000 | ORAL_TABLET | ORAL | Status: DC | PRN
  Administered 2023-08-08: 1 via ORAL
  Filled 2023-08-06: qty 1

## 2023-08-06 MED ORDER — AMLODIPINE BESYLATE 5 MG PO TABS
5.0000 mg | ORAL_TABLET | Freq: Every day | ORAL | Status: DC
  Administered 2023-08-07 – 2023-08-09 (×3): 5 mg via ORAL
  Filled 2023-08-06 (×3): qty 1

## 2023-08-06 MED ORDER — ZINC ACETATE 50 MG PO CAPS: 50.0000 mg | ORAL_CAPSULE | Freq: Every morning | ORAL | Status: DC

## 2023-08-06 MED ORDER — SODIUM CHLORIDE 0.9% IV SOLUTION: Freq: Once | INTRAVENOUS | Status: AC

## 2023-08-06 NOTE — H&P (Signed)
History and Physical    [name] [name] ZOX:[id number] DOB: 18-Dec-1950 DOA: 08/06/2023  PCP: Ignatius Specking, MD   Patient coming from: Home  I have personally briefly reviewed patient's old medical records in [facility]  Chief Complaint: Black stools and weakness  HPI: [name] [name] is a 72 y.o. 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  Patient noticed black stools for the last 4 to 5 days along with progressively worsening weakness, dyspnea on exertion and some epigastric pain.  He denied any fever, cough, chest pain, palpitations, loss of consciousness, seizures, dysuria, increased frequency of urination, syncope.  ED Course: He presented to [facility] for above complaints and was found to have hemoglobin of 4 (last hemoglobin prior to discharge in October 2024 was 7.3).  INR of 1.25.  High sensitive troponin was 54.  He was transfused 1 unit packed red cells. He was transferred to [facility]. Review of Systems: As per HPI otherwise all other systems were reviewed and are negative.   Past Medical History:  Diagnosis Date   Arthritis    Diabetes mellitus without complication (HCC)    GERD (gastroesophageal reflux disease)    Headache    Peripheral vascular disease (HCC)     Past Surgical History:  Procedure Laterality Date   ABDOMINAL AORTOGRAM W/LOWER EXTREMITY N/A 06/08/2023   Procedure: ABDOMINAL AORTOGRAM W/LOWER EXTREMITY;  Surgeon: Victorino Sparrow, MD;  Location: [facility] INVASIVE CV LAB;  Service: Cardiovascular;  Laterality: N/A;   APPENDECTOMY  1956   BACK SURGERY      COLONOSCOPY  1997   FEMORAL-TIBIAL BYPASS GRAFT Left 06/14/2023   Procedure: LEFT SUPERFICIAL FEMORAL-POSTERIOR TIBIAL ARTERY BYPASS;  Surgeon: Daria Pastures, MD;  Location: [facility] OR;  Service: Vascular;  Laterality: Left;   KNEE ARTHROSCOPY Left    LUMBAR LAMINECTOMY/DECOMPRESSION MICRODISCECTOMY Bilateral 10/18/2017   Procedure: Bilateral Lumbar Two-Three Lumbar Three-Four Laminotomies;  Surgeon: Barnett Abu, MD;  Location: [facility] OR;  Service: Neurosurgery;  Laterality: Bilateral;  Bilateral L2-3 L3-4 Laminotomies   NECK SURGERY  2001   PERIPHERAL VASCULAR INTERVENTION  06/08/2023   Procedure: PERIPHERAL VASCULAR INTERVENTION;  Surgeon: Victorino Sparrow, MD;  Location: [facility] INVASIVE CV LAB;  Service: Cardiovascular;;  Lt Common Iliac   VEIN HARVEST Left 06/14/2023   Procedure: SAPHENOUS VEIN HARVEST;  Surgeon: Daria Pastures, MD;  Location: [facility] OR;  Service: Vascular;  Laterality: Left;   WOUND DEBRIDEMENT Left 06/20/2023   Procedure: DEBRIDEMENT WOUND;  Surgeon: Daria Pastures, MD;  Location: [facility] OR;  Service: Vascular;  Laterality: Left;     reports that he quit smoking about 2 months ago. His smoking use included cigarettes. He started smoking about 59 years ago. He has a 14.8 pack-year smoking history. He has never used smokeless tobacco. He reports that he does not currently use alcohol. He reports that he does not use drugs.  Allergies  Allergen Reactions   Gadobutrol Hives, Itching and Swelling    After injection of MRI contrast Gadavist pt experienced facial itching, stuffy nose, eye swelling   Iodinated Contrast Media Hives    Family History  Problem Relation Age of Onset   Stroke Mother    Cancer Father  Heart disease Sister    Cancer Sister     Prior to Admission medications   Medication Sig Start Date End Date Taking? Authorizing Provider  amLODipine (NORVASC) 5 MG tablet Take 1 tablet (5 mg total) by mouth daily. 06/10/23   Joseph Art, DO  amoxicillin-clavulanate  (AUGMENTIN) 875-125 MG tablet Take 1 tablet by mouth every 12 (twelve) hours. 06/09/23   Joseph Art, DO  Ascorbic Acid 1000 MG TBCR Take 1,000 mg by mouth in the morning and at bedtime. 06/03/23   [provider]  aspirin 81 MG chewable tablet Chew 1 tablet (81 mg total) by mouth daily. 06/10/23   Joseph Art, DO  atorvastatin (LIPITOR) 40 MG tablet Take 1 tablet (40 mg total) by mouth daily. 06/10/23   Joseph Art, DO  clopidogrel (PLAVIX) 75 MG tablet Take 1 tablet (75 mg total) by mouth daily with breakfast. 06/10/23   Joseph Art, DO  HYDROcodone-acetaminophen (NORCO/VICODIN) 5-325 MG tablet Take 1 tablet by mouth every 4 (four) hours as needed for severe pain (pain score 7-10). 06/21/23   Baglia, Corrina, PA-C  levofloxacin (LEVAQUIN) 500 MG tablet Take 1 tablet (500 mg total) by mouth daily. 06/09/23   Joseph Art, DO  pantoprazole (PROTONIX) 40 MG tablet Take 1 tablet (40 mg total) by mouth daily. 06/10/23   Joseph Art, DO  vitamin B-12 (CYANOCOBALAMIN) 1000 MCG tablet Take 1,000 mcg by mouth daily.    [provider]  vitamin E 100 UNIT capsule Take 100 Units by mouth daily.    [provider]  Zinc Acetate 50 MG CAPS Take 50 mg by mouth in the morning. 06/03/23   [provider]    Physical Exam: Vitals:   08/06/23 1541  BP: (!) 142/51  Pulse: 86  Resp: 19  Temp: 98.8 F (37.1 C)  TempSrc: Oral  SpO2: 98%    Constitutional: NAD, calm, comfortable Vitals:   08/06/23 1541  BP: (!) 142/51  Pulse: 86  Resp: 19  Temp: 98.8 F (37.1 C)  TempSrc: Oral  SpO2: 98%   Eyes: PERRL, looks pale.   ENMT: Mucous membranes are moist. Posterior pharynx clear of any exudate or lesions. Neck: normal, supple, no masses, no thyromegaly Respiratory: bilateral decreased breath sounds at bases, no wheezing, no crackles. Normal respiratory effort. No accessory muscle use.  Cardiovascular: S1 S2 positive, rate controlled. No extremity edema.  2+ pedal pulses.  Abdomen: no tenderness, no masses palpated. No hepatosplenomegaly. Bowel sounds positive.  Musculoskeletal: no clubbing / cyanosis. No joint deformity upper and lower extremities.  Skin: Left foot dressing present.  No ecchymosis/petechiae  neurologic: CN 2-12 grossly intact. Moving extremities. No focal neurologic deficits.  Psychiatric: Normal judgment and insight. Alert and oriented x 3. Normal mood.    Labs on Admission: I have personally reviewed following labs and imaging studies  CBC: No results for input(s): "WBC", "NEUTROABS", "HGB", "HCT", "MCV", "PLT" in the last 168 hours. Basic Metabolic Panel: No results for input(s): "NA", "K", "CL", "CO2", "GLUCOSE", "BUN", "CREATININE", "CALCIUM", "MG", "PHOS" in the last 168 hours. GFR: CrCl cannot be calculated (Patient's most recent lab result is older than the maximum 21 days allowed.). Liver Function Tests: No results for input(s): "AST", "ALT", "ALKPHOS", "BILITOT", "PROT", "ALBUMIN" in the last 168 hours. No results for input(s): "LIPASE", "AMYLASE" in the last 168 hours. No results for input(s): "AMMONIA" in the last 168 hours. Coagulation Profile: No results for input(s): "INR", "PROTIME" in the last  168 hours. Cardiac Enzymes: No results for input(s): "CKTOTAL", "CKMB", "CKMBINDEX", "TROPONINI" in the last 168 hours. BNP (last 3 results) No results for input(s): "PROBNP" in the last 8760 hours. HbA1C: No results for input(s): "HGBA1C" in the last 72 hours. CBG: No results for input(s): "GLUCAP" in the last 168 hours. Lipid Profile: No results for input(s): "CHOL", "HDL", "LDLCALC", "TRIG", "CHOLHDL", "LDLDIRECT" in the last 72 hours. Thyroid Function Tests: No results for input(s): "TSH", "T4TOTAL", "FREET4", "T3FREE", "THYROIDAB" in the last 72 hours. Anemia Panel: No results for input(s): "VITAMINB12", "FOLATE", "FERRITIN", "TIBC", "IRON", "RETICCTPCT" in the last 72 hours. Urine analysis:     Component Value Date/Time   COLORURINE YELLOW 06/14/2023 0724   APPEARANCEUR CLEAR 06/14/2023 0724   LABSPEC 1.017 06/14/2023 0724   PHURINE 6.0 06/14/2023 0724   GLUCOSEU NEGATIVE 06/14/2023 0724   HGBUR NEGATIVE 06/14/2023 0724   BILIRUBINUR NEGATIVE 06/14/2023 0724   KETONESUR NEGATIVE 06/14/2023 0724   PROTEINUR NEGATIVE 06/14/2023 0724   NITRITE NEGATIVE 06/14/2023 0724   LEUKOCYTESUR SMALL (A) 06/14/2023 0724    Radiological Exams on Admission: No results found.    Assessment/Plan  Acute blood loss anemia Probable upper GI bleeding -Presented with black stools, weakness and dyspnea on exertion with hemoglobin of 4 (last hemoglobin prior to discharge in October 2024 was 7.3).  Last dose of aspirin and Plavix was yesterday.  Hold aspirin and Plavix. -1 unit packed red cells transfused at [facility].  Will check stat H&H and will transfuse accordingly. -Protonix 40 mg IV every 12 hours. -I have consulted GI.  Follow recommendations. -Clear liquid diet for now.  N.p.o. in AM.  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  Outpatient follow-up with vascular surgery/orthopedics -Aspirin and Plavix on hold.  Elevated troponin -Possibly from demand ischemia from GI bleed.  Will check lipid hide since he troponin.  Monitor on troponin.  No chest pain reported.  Hypertension -Continue amlodipine  Hyperlipidemia -Continue Lipitor    DVT prophylaxis: SCDs Code Status: Full Family Communication: None at bedside Disposition Plan: Home in 2 to 3 days once clinically improved Consults called: GI/Dr. Leonides Schanz Admission status: Progressive/inpatient  Severity of Illness: The appropriate patient status for this patient is INPATIENT. Inpatient  status is judged to be reasonable and necessary in order to provide the required intensity of service to ensure the patient's safety. The patient's presenting symptoms, physical exam findings, and initial radiographic and laboratory data in the context of their chronic comorbidities is felt to place them at high risk for further clinical deterioration. Furthermore, it is not anticipated that the patient will be medically stable for discharge from the hospital within 2 midnights of admission.   * I certify that at the point of admission it is my clinical judgment that the patient will require inpatient hospital care spanning beyond 2 midnights from the point of admission due to high intensity of service, high risk for further deterioration and high frequency of surveillance required.[name] Lloyd MD [facility]  08/06/2023, 4:15 PM

## 2023-08-06 NOTE — Plan of Care (Signed)
  Problem: Education: Goal: Knowledge of General Education information will improve Description: Including pain rating scale, medication(s)/side effects and non-pharmacologic comfort measures 08/06/2023 1756 by Ellison Carwin, RN Outcome: Progressing 08/06/2023 1756 by Ellison Carwin, RN Outcome: Progressing   Problem: Health Behavior/Discharge Planning: Goal: Ability to manage health-related needs will improve 08/06/2023 1756 by Ellison Carwin, RN Outcome: Progressing 08/06/2023 1756 by Ellison Carwin, RN Outcome: Progressing   Problem: Clinical Measurements: Goal: Ability to maintain clinical measurements within normal limits will improve 08/06/2023 1756 by Ellison Carwin, RN Outcome: Progressing 08/06/2023 1756 by Ellison Carwin, RN Outcome: Progressing Goal: Will remain free from infection 08/06/2023 1756 by Ellison Carwin, RN Outcome: Progressing 08/06/2023 1756 by Ellison Carwin, RN Outcome: Progressing Goal: Diagnostic test results will improve 08/06/2023 1756 by Ellison Carwin, RN Outcome: Progressing 08/06/2023 1756 by Ellison Carwin, RN Outcome: Progressing Goal: Respiratory complications will improve 08/06/2023 1756 by Ellison Carwin, RN Outcome: Progressing 08/06/2023 1756 by Ellison Carwin, RN Outcome: Progressing Goal: Cardiovascular complication will be avoided 08/06/2023 1756 by Ellison Carwin, RN Outcome: Progressing 08/06/2023 1756 by Ellison Carwin, RN Outcome: Progressing   Problem: Activity: Goal: Risk for activity intolerance will decrease 08/06/2023 1756 by Ellison Carwin, RN Outcome: Progressing 08/06/2023 1756 by Ellison Carwin, RN Outcome: Progressing   Problem: Nutrition: Goal: Adequate nutrition will be maintained 08/06/2023 1756 by Ellison Carwin, RN Outcome: Progressing 08/06/2023 1756 by Ellison Carwin, RN Outcome: Progressing   Problem: Coping: Goal: Level of anxiety will decrease 08/06/2023  1756 by Ellison Carwin, RN Outcome: Progressing 08/06/2023 1756 by Ellison Carwin, RN Outcome: Progressing   Problem: Elimination: Goal: Will not experience complications related to bowel motility 08/06/2023 1756 by Ellison Carwin, RN Outcome: Progressing 08/06/2023 1756 by Ellison Carwin, RN Outcome: Progressing Goal: Will not experience complications related to urinary retention 08/06/2023 1756 by Ellison Carwin, RN Outcome: Progressing 08/06/2023 1756 by Ellison Carwin, RN Outcome: Progressing   Problem: Pain Management: Goal: General experience of comfort will improve 08/06/2023 1756 by Ellison Carwin, RN Outcome: Progressing 08/06/2023 1756 by Ellison Carwin, RN Outcome: Progressing   Problem: Safety: Goal: Ability to remain free from injury will improve 08/06/2023 1756 by Ellison Carwin, RN Outcome: Progressing 08/06/2023 1756 by Ellison Carwin, RN Outcome: Progressing   Problem: Skin Integrity: Goal: Risk for impaired skin integrity will decrease 08/06/2023 1756 by Ellison Carwin, RN Outcome: Progressing 08/06/2023 1756 by Ellison Carwin, RN Outcome: Progressing

## 2023-08-07 ENCOUNTER — Inpatient Hospital Stay (HOSPITAL_COMMUNITY): Payer: Medicare HMO

## 2023-08-07 ENCOUNTER — Encounter (HOSPITAL_COMMUNITY): Payer: Self-pay | Admitting: Internal Medicine

## 2023-08-07 ENCOUNTER — Encounter (HOSPITAL_COMMUNITY)
Disposition: A | Discharge status: Home Health | Payer: Self-pay | Source: Home / Self Care | Attending: Internal Medicine

## 2023-08-07 DIAGNOSIS — B3781 Candidal esophagitis: Secondary | ICD-10-CM

## 2023-08-07 DIAGNOSIS — K2950 Unspecified chronic gastritis without bleeding: Secondary | ICD-10-CM | POA: Diagnosis not present

## 2023-08-07 DIAGNOSIS — K2289 Other specified disease of esophagus: Secondary | ICD-10-CM | POA: Diagnosis not present

## 2023-08-07 DIAGNOSIS — K3189 Other diseases of stomach and duodenum: Secondary | ICD-10-CM

## 2023-08-07 DIAGNOSIS — D649 Anemia, unspecified: Secondary | ICD-10-CM

## 2023-08-07 DIAGNOSIS — K2980 Duodenitis without bleeding: Secondary | ICD-10-CM | POA: Diagnosis not present

## 2023-08-07 DIAGNOSIS — K921 Melena: Secondary | ICD-10-CM | POA: Diagnosis not present

## 2023-08-07 DIAGNOSIS — K922 Gastrointestinal hemorrhage, unspecified: Secondary | ICD-10-CM | POA: Diagnosis not present

## 2023-08-07 HISTORY — PX: BIOPSY: SHX5522

## 2023-08-07 HISTORY — PX: ESOPHAGOGASTRODUODENOSCOPY (EGD) WITH PROPOFOL: SHX5813

## 2023-08-07 LAB — COMPREHENSIVE METABOLIC PANEL
ALT: 13 U/L (ref 0–44)
AST: 13 U/L — ABNORMAL LOW (ref 15–41)
Albumin: 3 g/dL — ABNORMAL LOW (ref 3.5–5.0)
Alkaline Phosphatase: 97 U/L (ref 38–126)
Anion gap: 7 (ref 5–15)
BUN: 6 mg/dL — ABNORMAL LOW (ref 8–23)
CO2: 23 mmol/L (ref 22–32)
Calcium: 8.8 mg/dL — ABNORMAL LOW (ref 8.9–10.3)
Chloride: 110 mmol/L (ref 98–111)
Creatinine, Ser: 0.79 mg/dL (ref 0.61–1.24)
GFR, Estimated: >60 mL/min (ref >60)
Glucose, Bld: 99 mg/dL (ref 70–99)
Potassium: 4 mmol/L (ref 3.5–5.1)
Sodium: 140 mmol/L (ref 135–145)
Total Bilirubin: 1.9 mg/dL — ABNORMAL HIGH (ref <1.2)
Total Protein: 6.3 g/dL — ABNORMAL LOW (ref 6.5–8.1)

## 2023-08-07 LAB — CBC
HCT: 20.3 % — ABNORMAL LOW (ref 39.0–52.0)
HCT: 27 % — ABNORMAL LOW (ref 39.0–52.0)
Hemoglobin: 6.7 g/dL — CL (ref 13.0–17.0)
Hemoglobin: 9.1 g/dL — ABNORMAL LOW (ref 13.0–17.0)
MCH: 27.3 pg (ref 26.0–34.0)
MCH: 27.7 pg (ref 26.0–34.0)
MCHC: 33 g/dL (ref 30.0–36.0)
MCHC: 33.7 g/dL (ref 30.0–36.0)
MCV: 82.9 fL (ref 80.0–100.0)
MCV: 82.3 fL (ref 80.0–100.0)
Platelets: 355 10*3/uL (ref 150–400)
Platelets: 357 10*3/uL (ref 150–400)
RBC: 2.45 MIL/uL — ABNORMAL LOW (ref 4.22–5.81)
RBC: 3.28 MIL/uL — ABNORMAL LOW (ref 4.22–5.81)
RDW: 15.3 % (ref 11.5–15.5)
RDW: 15.1 % (ref 11.5–15.5)
WBC: 8.7 10*3/uL (ref 4.0–10.5)
WBC: 9.5 10*3/uL (ref 4.0–10.5)
nRBC: 2 % — ABNORMAL HIGH (ref 0.0–0.2)
nRBC: 3.6 % — ABNORMAL HIGH (ref 0.0–0.2)

## 2023-08-07 LAB — PREPARE RBC (CROSSMATCH)

## 2023-08-07 LAB — PROTIME-INR
INR: 1.1 (ref 0.8–1.2)
Prothrombin Time: 14.6 s (ref 11.4–15.2)

## 2023-08-07 LAB — GLUCOSE, CAPILLARY: Glucose-Capillary: 105 mg/dL — ABNORMAL HIGH (ref 70–99)

## 2023-08-07 LAB — MAGNESIUM: Magnesium: 2.2 mg/dL (ref 1.7–2.4)

## 2023-08-07 SURGERY — ESOPHAGOGASTRODUODENOSCOPY (EGD) WITH PROPOFOL
Anesthesia: Monitor Anesthesia Care

## 2023-08-07 MED ORDER — PEG-KCL-NACL-NASULF-NA ASC-C 100 G PO SOLR
0.5000 | Freq: Once | ORAL | Status: DC
  Filled 2023-08-07: qty 1

## 2023-08-07 MED ORDER — OXYCODONE HCL 5 MG PO TABS: 5.0000 mg | ORAL_TABLET | Freq: Once | ORAL | Status: DC | PRN

## 2023-08-07 MED ORDER — SODIUM CHLORIDE 0.9 % IV SOLN: INTRAVENOUS | Status: AC

## 2023-08-07 MED ORDER — PEG-KCL-NACL-NASULF-NA ASC-C 100 G PO SOLR
0.5000 | Freq: Once | ORAL | Status: AC
  Administered 2023-08-08: 100 g via ORAL
  Filled 2023-08-07: qty 1

## 2023-08-07 MED ORDER — OXYCODONE HCL 5 MG/5ML PO SOLN: 5.0000 mg | Freq: Once | ORAL | Status: DC | PRN

## 2023-08-07 MED ORDER — SODIUM CHLORIDE 0.9% IV SOLUTION
Freq: Once | INTRAVENOUS | Status: AC
Start: 2023-08-07 — End: 2023-08-07

## 2023-08-07 MED ORDER — ACETAMINOPHEN 10 MG/ML IV SOLN: 1000.0000 mg | Freq: Once | INTRAVENOUS | Status: DC | PRN

## 2023-08-07 MED ORDER — PEG-KCL-NACL-NASULF-NA ASC-C 100 G PO SOLR: 0.5000 | Freq: Once | ORAL | Status: DC

## 2023-08-07 MED ORDER — PROPOFOL 500 MG/50ML IV EMUL
INTRAVENOUS | Status: DC | PRN
  Administered 2023-08-07: 150 ug/kg/min via INTRAVENOUS

## 2023-08-07 MED ORDER — PEG-KCL-NACL-NASULF-NA ASC-C 100 G PO SOLR
0.5000 | Freq: Once | ORAL | Status: AC
  Administered 2023-08-07: 100 g via ORAL

## 2023-08-07 MED ORDER — PEG-KCL-NACL-NASULF-NA ASC-C 100 G PO SOLR: 1.0000 | Freq: Once | ORAL | Status: DC

## 2023-08-07 MED ORDER — FENTANYL CITRATE (PF) 100 MCG/2ML IJ SOLN: 25.0000 ug | INTRAMUSCULAR | Status: DC | PRN

## 2023-08-07 MED ORDER — FUROSEMIDE 10 MG/ML IJ SOLN
40.0000 mg | Freq: Once | INTRAMUSCULAR | Status: AC
  Administered 2023-08-07: 40 mg via INTRAVENOUS
  Filled 2023-08-07: qty 4

## 2023-08-07 MED ORDER — DROPERIDOL 2.5 MG/ML IJ SOLN: 0.6250 mg | Freq: Once | INTRAMUSCULAR | Status: DC | PRN

## 2023-08-07 SURGICAL SUPPLY — 14 items

## 2023-08-07 NOTE — Plan of Care (Signed)
  Problem: Education: Goal: Knowledge of General Education information will improve Description: Including pain rating scale, medication(s)/side effects and non-pharmacologic comfort measures Outcome: Progressing   Problem: Health Behavior/Discharge Planning: Goal: Ability to manage health-related needs will improve Outcome: Progressing   Problem: Clinical Measurements: Goal: Ability to maintain clinical measurements within normal limits will improve Outcome: Progressing Goal: Will remain free from infection Outcome: Progressing Goal: Diagnostic test results will improve Outcome: Progressing Goal: Respiratory complications will improve Outcome: Progressing Goal: Cardiovascular complication will be avoided Outcome: Progressing   Problem: Activity: Goal: Risk for activity intolerance will decrease Outcome: Progressing   Problem: Nutrition: Goal: Adequate nutrition will be maintained Outcome: Progressing   Problem: Coping: Goal: Level of anxiety will decrease Outcome: Progressing   Problem: Elimination: Goal: Will not experience complications related to bowel motility Outcome: Progressing Goal: Will not experience complications related to urinary retention Outcome: Progressing   Problem: Pain Management: Goal: General experience of comfort will improve Outcome: Progressing   Problem: Skin Integrity: Goal: Risk for impaired skin integrity will decrease Outcome: Progressing

## 2023-08-07 NOTE — Progress Notes (Signed)
Patient received from endoscopy.Alert and oriented,vitals monitored and is stable.Second unit of blood running from endoscopy.Family is at bedside.Call bell on reach.

## 2023-08-07 NOTE — Transfer of Care (Signed)
Immediate Anesthesia Transfer of Care Note  Patient: [name] [name]  Procedure(s) Performed: ESOPHAGOGASTRODUODENOSCOPY (EGD) WITH PROPOFOL BIOPSY  Patient Location: PACU  Anesthesia Type:MAC  Level of Consciousness: sedated  Airway & Oxygen Therapy: Patient Spontanous Breathing and Patient connected to nasal cannula oxygen  Post-op Assessment: Report given to RN and Post -op Vital signs reviewed and stable  Post vital signs: Reviewed and stable  Last Vitals:  Vitals Value Taken Time  BP 119/99 08/07/23 1243  Temp 36.7 C 08/07/23 1243  Pulse 68 08/07/23 1245  Resp 14 08/07/23 1245  SpO2 100 % 08/07/23 1245  Vitals shown include unfiled device data.  Last Pain:  Vitals:   08/07/23 1223  TempSrc: Temporal  PainSc:       Patients Stated Pain Goal: 0 (08/07/23 0500)  Complications: There were no known notable events for this encounter.

## 2023-08-07 NOTE — Op Note (Signed)
[facility] Patient Name: [name] [name] Procedure Date : 08/07/2023 MRN: [id number] Attending MD: Particia Lather , , [id number] Date of Birth: 10-Feb-1951 CSN: [id number] Age: 72 Admit Type: Inpatient Procedure:                Upper GI endoscopy Indications:              Melena, Anemia Providers:                Madelyn Brunner" Faythe Casa, RN, Marja Kays, Technician Referring MD:             Dulcy Fanny Hendrix Medicines:                Monitored Anesthesia Care Complications:            No immediate complications. Estimated Blood Loss:     Estimated blood loss was minimal. Procedure:                Pre-Anesthesia Assessment:                           - Prior to the procedure, a History and Physical                            was performed, and patient medications and                            allergies were reviewed. The patient's tolerance of                            previous anesthesia was also reviewed. The risks                            and benefits of the procedure and the sedation                            options and risks were discussed with the patient.                            All questions were answered, and informed consent                            was obtained. Prior Anticoagulants: The patient has                            taken Plavix (clopidogrel), last dose was 2 days                            prior to procedure. ASA Grade Assessment: III - A                            patient with severe systemic disease. After  reviewing the risks and benefits, the patient was                            deemed in satisfactory condition to undergo the                            procedure.                           After obtaining informed consent, the endoscope was                            passed under direct vision. Throughout the                            procedure, the patient's  blood pressure, pulse, and                            oxygen saturations were monitored continuously. The                            [id number]-[id number] ([id number]) Olympus endoscope was introduced                            through the mouth, and advanced to the second part                            of duodenum. The upper GI endoscopy was                            accomplished without difficulty. The patient                            tolerated the procedure well. Scope In: Scope Out: Findings:      White nummular lesions were noted in the entire esophagus. Biopsies were       taken with a cold forceps for histology.      Localized mildly erythematous mucosa without bleeding was found in the       gastric antrum. Biopsies were taken with a cold forceps for Helicobacter       pylori testing.      Localized inflammation characterized by congestion (edema), erosions,       erythema and nodularity was found in the duodenal bulb. Biopsies were       taken with a cold forceps for histology. Impression:               - White nummular lesions in esophageal mucosa.                            Biopsied.                           - Erythematous mucosa in the antrum. Biopsied.                           - Duodenitis. Biopsied. Recommendation:           -  Return patient to hospital ward for ongoing care.                           - Use Protonix (pantoprazole) 40 mg PO BID for 8                            weeks.                           - Await pathology results.                           - Perform a colonoscopy tomorrow.                           - The findings and recommendations were discussed                            with the patient. Procedure Code(s):        --- Professional ---                           [phone], Esophagogastroduodenoscopy, flexible,                            transoral; with biopsy, single or multiple Diagnosis Code(s):        --- Professional ---                           K22.89,  Other specified disease of esophagus                           K31.89, Other diseases of stomach and duodenum                           K29.80, Duodenitis without bleeding                           K92.1, Melena (includes Hematochezia)                           D64.9, Anemia, unspecified CPT copyright 2022 [employer] Association. All rights reserved. The codes documented in this report are preliminary and upon coder review may  be revised to meet current compliance requirements. Dr Particia Lather "[name]" Leonides Schanz,  08/07/2023 12:54:34 PM Number of Addenda: 0

## 2023-08-07 NOTE — Progress Notes (Signed)
PT Cancellation Note  Patient Details Name: [name] [name] MRN: [id number] DOB: 1951-03-07   Cancelled Treatment:    Reason Eval/Treat Not Completed: Medical issues which prohibited therapy (Per RN, pt Hgb is currently 6.7 and is receiving blood transfusion. After transfusion, pt scheduled for upper endoscopy. Will follow up later if time allows.)   Gladys Damme 08/07/2023, 10:23 AM

## 2023-08-07 NOTE — Anesthesia Postprocedure Evaluation (Signed)
Anesthesia Post Note  Patient: [name] [name]  Procedure(s) Performed: ESOPHAGOGASTRODUODENOSCOPY (EGD) WITH PROPOFOL BIOPSY     Patient location during evaluation: PACU Anesthesia Type: MAC Level of consciousness: awake and alert Pain management: pain level controlled Vital Signs Assessment: post-procedure vital signs reviewed and stable Respiratory status: spontaneous breathing, nonlabored ventilation, respiratory function stable and patient connected to nasal cannula oxygen Cardiovascular status: stable and blood pressure returned to baseline Postop Assessment: no apparent nausea or vomiting Anesthetic complications: no   There were no known notable events for this encounter.  Last Vitals:  Vitals:   08/07/23 1300 08/07/23 1326  BP: (!) 119/58 129/62  Pulse: 73 66  Resp: 18 14  Temp: 36.7 C 36.6 C  SpO2: 100% 100%    Last Pain:  Vitals:   08/07/23 1326  TempSrc: Oral  PainSc:                  Ferguson Nation

## 2023-08-07 NOTE — Interval H&P Note (Signed)
History and Physical Interval Note:  08/07/2023 12:03 PM  [name] [name]  has presented today for surgery, with the diagnosis of Anemia, black stools.  The various methods of treatment have been discussed with the patient and family. After consideration of risks, benefits and other options for treatment, the patient has consented to  Procedure(s): ESOPHAGOGASTRODUODENOSCOPY (EGD) WITH PROPOFOL (N/A) as a surgical intervention.  The patient's history has been reviewed, patient examined, no change in status, stable for surgery.  I have reviewed the patient's chart and labs.  Questions were answered to the patient's satisfaction.     Imogene Burn

## 2023-08-07 NOTE — Plan of Care (Signed)

## 2023-08-07 NOTE — Consult Note (Signed)
Referring Provider: Dr. Hanley Ben, [facility] Primary Care Physician:  Ignatius Specking, MD Primary Gastroenterologist:  Gentry Fitz  Reason for Consultation:  GI bleed  HPI: [name] [name] is a 72 y.o. male past medical history of arthritis, diabetes, GERD, peripheral arterial/vascular disease on aspirin and Plavix, history of back surgeries.  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  He presented to [facility] with complaints of black stools, weakness, shortness of breath.  Hemoglobin found to be around 4 g.  Last hemoglobin prior to discharge in October was 7.3 g.  INR normal.  Was transfused a unit of packed red blood cells and transferred to [facility].  When he got here hemoglobin was 4.9 g, received 2 units of packed red blood cells and up to 6.7 g this morning so is going to be given another 2 units, one hanging now.  Says that he has been seeing the black stools for about week.  No history of GI bleeding in the past.  Says he had a colonoscopy a long time ago, but not aware of any upper endoscopy.  No history of ulcers.  Denies NSAID use.  No nausea, vomiting, abdominal pain.  No red blood seen in his stool.  Says that he does have heartburn/reflux/indigestion but does not take anything for that regularly.  Has not used Pepto-Bismol, use maybe Mylanta once and very rarely will take Alka-Seltzer.  No BM for couple of days now.  Plavix is on hold as well as aspirin.  Has been started on pantoprazole 40 mg IV twice daily.  Past Medical History:  Diagnosis Date   Arthritis    Diabetes mellitus without complication (HCC)    GERD (gastroesophageal reflux disease)    Headache    Peripheral vascular disease (HCC)     Past Surgical History:  Procedure Laterality Date   ABDOMINAL AORTOGRAM W/LOWER  EXTREMITY N/A 06/08/2023   Procedure: ABDOMINAL AORTOGRAM W/LOWER EXTREMITY;  Surgeon: Victorino Sparrow, MD;  Location: [facility] INVASIVE CV LAB;  Service: Cardiovascular;  Laterality: N/A;   APPENDECTOMY  1956   BACK SURGERY     COLONOSCOPY  1997   FEMORAL-TIBIAL BYPASS GRAFT Left 06/14/2023   Procedure: LEFT SUPERFICIAL FEMORAL-POSTERIOR TIBIAL ARTERY BYPASS;  Surgeon: Daria Pastures, MD;  Location: [facility] OR;  Service: Vascular;  Laterality: Left;   KNEE ARTHROSCOPY Left    LUMBAR LAMINECTOMY/DECOMPRESSION MICRODISCECTOMY Bilateral 10/18/2017   Procedure: Bilateral Lumbar Two-Three Lumbar Three-Four Laminotomies;  Surgeon: Barnett Abu, MD;  Location: [facility] OR;  Service: Neurosurgery;  Laterality: Bilateral;  Bilateral L2-3 L3-4 Laminotomies   NECK SURGERY  2001   PERIPHERAL VASCULAR INTERVENTION  06/08/2023   Procedure: PERIPHERAL VASCULAR INTERVENTION;  Surgeon: Victorino Sparrow, MD;  Location: [facility] INVASIVE CV LAB;  Service: Cardiovascular;;  Lt Common Iliac   VEIN HARVEST Left 06/14/2023   Procedure: SAPHENOUS VEIN HARVEST;  Surgeon: Daria Pastures, MD;  Location: [facility] OR;  Service: Vascular;  Laterality: Left;   WOUND DEBRIDEMENT Left 06/20/2023   Procedure: DEBRIDEMENT WOUND;  Surgeon: Daria Pastures, MD;  Location: [facility] OR;  Service: Vascular;  Laterality: Left;    Prior to Admission medications   Medication Sig Start Date End Date Taking? Authorizing Provider  amLODipine (NORVASC) 5 MG tablet Take 1 tablet (5 mg total) by mouth daily. 06/10/23  Yes Joseph Art, DO  Ascorbic Acid 1000 MG TBCR Take 1,000 mg by mouth in the morning and at bedtime. 06/03/23  Yes [provider]  aspirin 81 MG chewable tablet Chew 1 tablet (81 mg total) by mouth daily. 06/10/23  Yes Vann, Zacary Bauer U, DO  atorvastatin (LIPITOR) 40 MG tablet Take 1 tablet (40 mg total) by mouth daily. 06/10/23  Yes Joseph Art, DO  clopidogrel (PLAVIX) 75 MG tablet Take 1 tablet (75 mg total) by mouth daily with breakfast.  06/10/23  Yes Vann, Rosslyn Pasion U, DO  ferrous sulfate 325 (65 FE) MG tablet Take 325 mg by mouth daily with breakfast.   Yes [provider]  gabapentin (NEURONTIN) 300 MG capsule Take 300 mg by mouth 3 (three) times daily. 07/18/23  Yes [provider]  vitamin B-12 (CYANOCOBALAMIN) 1000 MCG tablet Take 1,000 mcg by mouth daily.   Yes [provider]  vitamin E 100 UNIT capsule Take 100 Units by mouth daily.   Yes [provider]  Wound Dressings ([employer] MANUKA HONEY WOUND EX) Apply 1 Application topically as needed (for bandage changes).   Yes [provider]  Zinc Acetate 50 MG CAPS Take 50 mg by mouth in the morning. 06/03/23  Yes [provider]  amoxicillin-clavulanate (AUGMENTIN) 875-125 MG tablet Take 1 tablet by mouth every 12 (twelve) hours. Patient not taking: Reported on 08/06/2023 06/09/23   Joseph Art, DO    Current Facility-Administered Medications  Medication Dose Route Frequency Provider Last Rate Last Admin   0.9 %  sodium chloride infusion (Manually program via Guardrails IV Fluids)   Intravenous Once Leroy Sea, MD       acetaminophen (TYLENOL) tablet 650 mg  650 mg Oral Q6H PRN Glade Lloyd, MD   650 mg at 08/06/23 2110   Or   acetaminophen (TYLENOL) suppository 650 mg  650 mg Rectal Q6H PRN Glade Lloyd, MD       amLODipine (NORVASC) tablet 5 mg  5 mg Oral Daily Alekh, Kshitiz, MD       atorvastatin (LIPITOR) tablet 40 mg  40 mg Oral QHS Alekh, Kshitiz, MD   40 mg at 08/06/23 2110   furosemide (LASIX) injection 40 mg  40 mg Intravenous Once Leroy Sea, MD       HYDROcodone-acetaminophen (NORCO/VICODIN) 5-325 MG per tablet 1 tablet  1 tablet Oral Q4H PRN Glade Lloyd, MD       pantoprazole (PROTONIX) injection 40 mg  40 mg Intravenous Q12H Alekh, Kshitiz, MD   40 mg at 08/07/23 4696   zinc sulfate (50mg  elemental zinc) capsule 220 mg  220 mg Oral Daily Alekh, Kshitiz, MD   220 mg at 08/07/23 2952    Facility-Administered Medications Ordered in Other Encounters  Medication Dose Route Frequency Provider Last Rate Last Admin   ondansetron (ZOFRAN) 4 mg in sodium chloride 0.9 % 50 mL IVPB  4 mg Intravenous Q6H PRN Barnett Abu, MD        Allergies as of 08/06/2023 - Review Complete 08/06/2023  Allergen Reaction Noted   Gadobutrol Hives, Itching, and Swelling 04/09/2023   Iodinated contrast media Hives 04/09/2023    Family History  Problem Relation Age of Onset   Stroke Mother    Cancer Father    Heart disease Sister    Cancer Sister     Social History   Socioeconomic History   Marital status: Widowed    Spouse name: Not on file   Number of children: 3   Years  of education: Not on file   Highest education level: Not on file  Occupational History   Not on file  Tobacco Use   Smoking status: Former    Current packs/day: 0.00    Average packs/day: 0.3 packs/day for 59.0 years (14.8 ttl pk-yrs)    Types: Cigarettes    Start date: 43    Quit date: 06/01/2023    Years since quitting: 0.1   Smokeless tobacco: Never  Vaping Use   Vaping status: Never Used  Substance and Sexual Activity   Alcohol use: Not Currently    Comment: occ   Drug use: No   Sexual activity: Not on file  Other Topics Concern   Not on file  Social History Narrative   Not on file   Social Determinants of Health   Financial Resource Strain: Low Risk  (06/02/2023)   Received from [facility]   Overall Financial Resource Strain (CARDIA)    Difficulty of Paying Living Expenses: Not very hard  Food Insecurity: No Food Insecurity (08/06/2023)   Hunger Vital Sign    Worried About Running Out of Food in the Last Year: Never true    Ran Out of Food in the Last Year: Never true  Transportation Needs: No Transportation Needs (08/06/2023)   PRAPARE - Administrator, Civil Service (Medical): No    Lack of Transportation (Non-Medical): No  Physical Activity: Not on file  Stress: Not  on file  Social Connections: Not on file  Intimate Partner Violence: Unknown (08/06/2023)   Humiliation, Afraid, Rape, and Kick questionnaire    Fear of Current or Ex-Partner: Patient declined    Emotionally Abused: No    Physically Abused: Patient declined    Sexually Abused: Patient declined    Review of Systems: ROS is O/W negative except as mentioned in HPI.  Physical Exam: Vital signs in last 24 hours: Temp:  [98 F (36.7 C)-99.3 F (37.4 C)] 98.4 F (36.9 C) (12/01 0803) Pulse Rate:  [67-87] 68 (12/01 0803) Resp:  [12-22] 19 (12/01 0803) BP: (106-155)/(51-79) 106/51 (12/01 0803) SpO2:  [98 %-100 %] 100 % (12/01 0500) Weight:  [90.2 kg] 90.2 kg (12/01 0803)   General:  Alert, Well-developed, well-nourished, pleasant and cooperative in NAD Head:  Normocephalic and atraumatic. Eyes:  Sclera clear, no icterus.  Conjunctiva pink. Ears:  Normal auditory acuity. Mouth:  No deformity or lesions. Lungs:  Clear throughout to auscultation.  No wheezes, crackles, or rhonchi.  Heart:  Regular rate and rhythm; no murmurs, clicks, rubs, or gallops. Abdomen:  Soft, non-distended.  BS present.  Non-tender. Pulses:  Normal pulses noted. Extremities:  Without clubbing or edema. Neurologic:  Alert and oriented x 4;  grossly normal neurologically. Skin:  Intact without significant lesions or rashes. Psych:  Alert and cooperative. Normal mood and affect.  Intake/Output from previous day: 11/30 0701 - 12/01 0700 In: 810 [P.O.:120; I.V.:60; Blood:630] Out: 800 [Urine:800]  Lab Results: Recent Labs    08/06/23 1628 08/07/23 0519  WBC  --  8.7  HGB 4.9* 6.7*  HCT 15.4* 20.3*  PLT  --  355   BMET Recent Labs    08/07/23 0519  NA 140  K 4.0  CL 110  CO2 23  GLUCOSE 99  BUN 6*  CREATININE 0.79  CALCIUM 8.8*   LFT Recent Labs    08/07/23 0519  PROT 6.3*  ALBUMIN 3.0*  AST 13*  ALT 13  ALKPHOS 97  BILITOT 1.9*  PT/INR Recent Labs    08/07/23 0519  LABPROT  14.6  INR 1.1   IMPRESSION:  *72 year old male who presented with complaints of black stools x approximately one week and weakness/DOE.  Hemoglobin initially 4 g.  Has been given 3 units of packed red blood cells.  Hemoglobin 6.7 g today.  Going to get 2 more units.  Colonoscopy years ago.  No history of EGD. *PAD/PVD on Plavix and ASA: Last dose 11/29, now on hold. *Indigestion:  Does not take anything for this regularly.  PLAN: -Will plan for EGD today. -Monitor hemoglobin and transfuse further if needed. -He is on pantoprazole 40 mg IV twice daily and should continue that for now. -If EGD negative will need to consider prep for colonoscopy.   Princella Pellegrini. Verneal Wiers  08/07/2023, 9:12 AM

## 2023-08-07 NOTE — H&P (View-Only) (Signed)
Referring Provider: Dr. Hanley Ben, [facility] Primary Care Physician:  Ignatius Specking, MD Primary Gastroenterologist:  Gentry Fitz  Reason for Consultation:  GI bleed  HPI: [name] [name] is a 72 y.o. male past medical history of arthritis, diabetes, GERD, peripheral arterial/vascular disease on aspirin and Plavix, history of back surgeries.  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  He presented to [facility] with complaints of black stools, weakness, shortness of breath.  Hemoglobin found to be around 4 g.  Last hemoglobin prior to discharge in October was 7.3 g.  INR normal.  Was transfused a unit of packed red blood cells and transferred to [facility].  When he got here hemoglobin was 4.9 g, received 2 units of packed red blood cells and up to 6.7 g this morning so is going to be given another 2 units, one hanging now.  Says that he has been seeing the black stools for about week.  No history of GI bleeding in the past.  Says he had a colonoscopy a long time ago, but not aware of any upper endoscopy.  No history of ulcers.  Denies NSAID use.  No nausea, vomiting, abdominal pain.  No red blood seen in his stool.  Says that he does have heartburn/reflux/indigestion but does not take anything for that regularly.  Has not used Pepto-Bismol, use maybe Mylanta once and very rarely will take Alka-Seltzer.  No BM for couple of days now.  Plavix is on hold as well as aspirin.  Has been started on pantoprazole 40 mg IV twice daily.  Past Medical History:  Diagnosis Date   Arthritis    Diabetes mellitus without complication (HCC)    GERD (gastroesophageal reflux disease)    Headache    Peripheral vascular disease (HCC)     Past Surgical History:  Procedure Laterality Date   ABDOMINAL AORTOGRAM W/LOWER  EXTREMITY N/A 06/08/2023   Procedure: ABDOMINAL AORTOGRAM W/LOWER EXTREMITY;  Surgeon: Victorino Sparrow, MD;  Location: [facility] INVASIVE CV LAB;  Service: Cardiovascular;  Laterality: N/A;   APPENDECTOMY  1956   BACK SURGERY     COLONOSCOPY  1997   FEMORAL-TIBIAL BYPASS GRAFT Left 06/14/2023   Procedure: LEFT SUPERFICIAL FEMORAL-POSTERIOR TIBIAL ARTERY BYPASS;  Surgeon: Daria Pastures, MD;  Location: [facility] OR;  Service: Vascular;  Laterality: Left;   KNEE ARTHROSCOPY Left    LUMBAR LAMINECTOMY/DECOMPRESSION MICRODISCECTOMY Bilateral 10/18/2017   Procedure: Bilateral Lumbar Two-Three Lumbar Three-Four Laminotomies;  Surgeon: Barnett Abu, MD;  Location: [facility] OR;  Service: Neurosurgery;  Laterality: Bilateral;  Bilateral L2-3 L3-4 Laminotomies   NECK SURGERY  2001   PERIPHERAL VASCULAR INTERVENTION  06/08/2023   Procedure: PERIPHERAL VASCULAR INTERVENTION;  Surgeon: Victorino Sparrow, MD;  Location: [facility] INVASIVE CV LAB;  Service: Cardiovascular;;  Lt Common Iliac   VEIN HARVEST Left 06/14/2023   Procedure: SAPHENOUS VEIN HARVEST;  Surgeon: Daria Pastures, MD;  Location: [facility] OR;  Service: Vascular;  Laterality: Left;   WOUND DEBRIDEMENT Left 06/20/2023   Procedure: DEBRIDEMENT WOUND;  Surgeon: Daria Pastures, MD;  Location: [facility] OR;  Service: Vascular;  Laterality: Left;    Prior to Admission medications   Medication Sig Start Date End Date Taking? Authorizing Provider  amLODipine (NORVASC) 5 MG tablet Take 1 tablet (5 mg total) by mouth daily. 06/10/23  Yes Joseph Art, DO  Ascorbic Acid 1000 MG TBCR Take 1,000 mg by mouth in the morning and at bedtime. 06/03/23  Yes [provider]  aspirin 81 MG chewable tablet Chew 1 tablet (81 mg total) by mouth daily. 06/10/23  Yes Vann, Zacary Bauer U, DO  atorvastatin (LIPITOR) 40 MG tablet Take 1 tablet (40 mg total) by mouth daily. 06/10/23  Yes Joseph Art, DO  clopidogrel (PLAVIX) 75 MG tablet Take 1 tablet (75 mg total) by mouth daily with breakfast.  06/10/23  Yes Vann, Rosslyn Pasion U, DO  ferrous sulfate 325 (65 FE) MG tablet Take 325 mg by mouth daily with breakfast.   Yes [provider]  gabapentin (NEURONTIN) 300 MG capsule Take 300 mg by mouth 3 (three) times daily. 07/18/23  Yes [provider]  vitamin B-12 (CYANOCOBALAMIN) 1000 MCG tablet Take 1,000 mcg by mouth daily.   Yes [provider]  vitamin E 100 UNIT capsule Take 100 Units by mouth daily.   Yes [provider]  Wound Dressings ([employer] MANUKA HONEY WOUND EX) Apply 1 Application topically as needed (for bandage changes).   Yes [provider]  Zinc Acetate 50 MG CAPS Take 50 mg by mouth in the morning. 06/03/23  Yes [provider]  amoxicillin-clavulanate (AUGMENTIN) 875-125 MG tablet Take 1 tablet by mouth every 12 (twelve) hours. Patient not taking: Reported on 08/06/2023 06/09/23   Joseph Art, DO    Current Facility-Administered Medications  Medication Dose Route Frequency Provider Last Rate Last Admin   0.9 %  sodium chloride infusion (Manually program via Guardrails IV Fluids)   Intravenous Once Leroy Sea, MD       acetaminophen (TYLENOL) tablet 650 mg  650 mg Oral Q6H PRN Glade Lloyd, MD   650 mg at 08/06/23 2110   Or   acetaminophen (TYLENOL) suppository 650 mg  650 mg Rectal Q6H PRN Glade Lloyd, MD       amLODipine (NORVASC) tablet 5 mg  5 mg Oral Daily Alekh, Kshitiz, MD       atorvastatin (LIPITOR) tablet 40 mg  40 mg Oral QHS Alekh, Kshitiz, MD   40 mg at 08/06/23 2110   furosemide (LASIX) injection 40 mg  40 mg Intravenous Once Leroy Sea, MD       HYDROcodone-acetaminophen (NORCO/VICODIN) 5-325 MG per tablet 1 tablet  1 tablet Oral Q4H PRN Glade Lloyd, MD       pantoprazole (PROTONIX) injection 40 mg  40 mg Intravenous Q12H Alekh, Kshitiz, MD   40 mg at 08/07/23 4696   zinc sulfate (50mg  elemental zinc) capsule 220 mg  220 mg Oral Daily Alekh, Kshitiz, MD   220 mg at 08/07/23 2952    Facility-Administered Medications Ordered in Other Encounters  Medication Dose Route Frequency Provider Last Rate Last Admin   ondansetron (ZOFRAN) 4 mg in sodium chloride 0.9 % 50 mL IVPB  4 mg Intravenous Q6H PRN Barnett Abu, MD        Allergies as of 08/06/2023 - Review Complete 08/06/2023  Allergen Reaction Noted   Gadobutrol Hives, Itching, and Swelling 04/09/2023   Iodinated contrast media Hives 04/09/2023    Family History  Problem Relation Age of Onset   Stroke Mother    Cancer Father    Heart disease Sister    Cancer Sister     Social History   Socioeconomic History   Marital status: Widowed    Spouse name: Not on file   Number of children: 3   Years  of education: Not on file   Highest education level: Not on file  Occupational History   Not on file  Tobacco Use   Smoking status: Former    Current packs/day: 0.00    Average packs/day: 0.3 packs/day for 59.0 years (14.8 ttl pk-yrs)    Types: Cigarettes    Start date: 43    Quit date: 06/01/2023    Years since quitting: 0.1   Smokeless tobacco: Never  Vaping Use   Vaping status: Never Used  Substance and Sexual Activity   Alcohol use: Not Currently    Comment: occ   Drug use: No   Sexual activity: Not on file  Other Topics Concern   Not on file  Social History Narrative   Not on file   Social Determinants of Health   Financial Resource Strain: Low Risk  (06/02/2023)   Received from [facility]   Overall Financial Resource Strain (CARDIA)    Difficulty of Paying Living Expenses: Not very hard  Food Insecurity: No Food Insecurity (08/06/2023)   Hunger Vital Sign    Worried About Running Out of Food in the Last Year: Never true    Ran Out of Food in the Last Year: Never true  Transportation Needs: No Transportation Needs (08/06/2023)   PRAPARE - Administrator, Civil Service (Medical): No    Lack of Transportation (Non-Medical): No  Physical Activity: Not on file  Stress: Not  on file  Social Connections: Not on file  Intimate Partner Violence: Unknown (08/06/2023)   Humiliation, Afraid, Rape, and Kick questionnaire    Fear of Current or Ex-Partner: Patient declined    Emotionally Abused: No    Physically Abused: Patient declined    Sexually Abused: Patient declined    Review of Systems: ROS is O/W negative except as mentioned in HPI.  Physical Exam: Vital signs in last 24 hours: Temp:  [98 F (36.7 C)-99.3 F (37.4 C)] 98.4 F (36.9 C) (12/01 0803) Pulse Rate:  [67-87] 68 (12/01 0803) Resp:  [12-22] 19 (12/01 0803) BP: (106-155)/(51-79) 106/51 (12/01 0803) SpO2:  [98 %-100 %] 100 % (12/01 0500) Weight:  [90.2 kg] 90.2 kg (12/01 0803)   General:  Alert, Well-developed, well-nourished, pleasant and cooperative in NAD Head:  Normocephalic and atraumatic. Eyes:  Sclera clear, no icterus.  Conjunctiva pink. Ears:  Normal auditory acuity. Mouth:  No deformity or lesions. Lungs:  Clear throughout to auscultation.  No wheezes, crackles, or rhonchi.  Heart:  Regular rate and rhythm; no murmurs, clicks, rubs, or gallops. Abdomen:  Soft, non-distended.  BS present.  Non-tender. Pulses:  Normal pulses noted. Extremities:  Without clubbing or edema. Neurologic:  Alert and oriented x 4;  grossly normal neurologically. Skin:  Intact without significant lesions or rashes. Psych:  Alert and cooperative. Normal mood and affect.  Intake/Output from previous day: 11/30 0701 - 12/01 0700 In: 810 [P.O.:120; I.V.:60; Blood:630] Out: 800 [Urine:800]  Lab Results: Recent Labs    08/06/23 1628 08/07/23 0519  WBC  --  8.7  HGB 4.9* 6.7*  HCT 15.4* 20.3*  PLT  --  355   BMET Recent Labs    08/07/23 0519  NA 140  K 4.0  CL 110  CO2 23  GLUCOSE 99  BUN 6*  CREATININE 0.79  CALCIUM 8.8*   LFT Recent Labs    08/07/23 0519  PROT 6.3*  ALBUMIN 3.0*  AST 13*  ALT 13  ALKPHOS 97  BILITOT 1.9*  PT/INR Recent Labs    08/07/23 0519  LABPROT  14.6  INR 1.1   IMPRESSION:  *72 year old male who presented with complaints of black stools x approximately one week and weakness/DOE.  Hemoglobin initially 4 g.  Has been given 3 units of packed red blood cells.  Hemoglobin 6.7 g today.  Going to get 2 more units.  Colonoscopy years ago.  No history of EGD. *PAD/PVD on Plavix and ASA: Last dose 11/29, now on hold. *Indigestion:  Does not take anything for this regularly.  PLAN: -Will plan for EGD today. -Monitor hemoglobin and transfuse further if needed. -He is on pantoprazole 40 mg IV twice daily and should continue that for now. -If EGD negative will need to consider prep for colonoscopy.   Princella Pellegrini. Verneal Wiers  08/07/2023, 9:12 AM

## 2023-08-07 NOTE — Progress Notes (Signed)
PROGRESS NOTE                                                                                                                                                                                                             Patient Demographics:    [name] [name], is a 72 y.o. male, DOB - 12-28-1950, ZOX:[id number]  Outpatient Primary MD for the patient is Ignatius Specking, MD    LOS - 1  Admit date - 08/06/2023    No chief complaint on file.      Brief Narrative (HPI from H&P)   71 y.o. 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  The ER workup suggestive of severe upper GI bleed related anemia and he was admitted.    Subjective:    [name] [name] today has, No headache, No chest pain, No abdominal pain - No Nausea, No new weakness tingling or numbness, no SOB   Assessment  & Plan :    Acute blood loss anemia likely upper GI bleed. On DAPT held, received 2 units of packed RBC transfusions already getting third unit on 08/07/2023, IV PPI, GI on board.  Need to monitor CBC closely.   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  Outpatient follow-up with vascular surgery/orthopedics -Aspirin and Plavix on hold.   Elevated troponin -Possibly from demand ischemia from GI bleed.  Will check lipid hide since he troponin.  Monitor  on troponin.  No chest pain reported.   Hypertension -Continue amlodipine   Hyperlipidemia -Continue Lipitor        Condition - Fair  Family Communication  :  wife at side on 08/07/2023  Code Status :  Full  Consults  :  GI  PUD Prophylaxis : PPI   Procedures  :            Disposition Plan  :    Status is:  Inpatient   DVT Prophylaxis  :    SCDs Start: 08/06/23 1611    Lab Results  Component Value Date   PLT 355 08/07/2023    Diet :  Diet Order             Diet NPO time specified Except for: Ice Chips, Sips with Meds  Diet effective midnight                    Inpatient Medications  Scheduled Meds:  amLODipine  5 mg Oral Daily   atorvastatin  40 mg Oral QHS   furosemide  40 mg Intravenous Once   pantoprazole (PROTONIX) IV  40 mg Intravenous Q12H   zinc sulfate (50mg  elemental zinc)  220 mg Oral Daily   Continuous Infusions: PRN Meds:.acetaminophen **OR** acetaminophen, HYDROcodone-acetaminophen  Antibiotics  :    Anti-infectives (From admission, onward)    None         Objective:   Vitals:   08/07/23 0756 08/07/23 0803 08/07/23 0915 08/07/23 0935  BP: (!) 155/79 (!) 106/51 132/65 (!) 134/59  Pulse: 70 68 69 70  Resp: 18 19 16 14   Temp: 99.3 F (37.4 C) 98.4 F (36.9 C) 98.4 F (36.9 C) 98.6 F (37 C)  TempSrc: Oral Oral Oral Oral  SpO2:   99% 99%  Weight:  90.2 kg      Wt Readings from Last 3 Encounters:  08/07/23 90.2 kg  07/15/23 90.2 kg  06/20/23 88.5 kg     Intake/Output Summary (Last 24 hours) at 08/07/2023 1057 Last data filed at 08/07/2023 0923 Gross per 24 hour  Intake 810 ml  Output 1050 ml  Net -240 ml     Physical Exam  Awake Alert, No new F.N deficits, Normal affect [place].AT,PERRAL Supple Neck, No JVD,   Symmetrical Chest wall movement, Good air movement bilaterally, CTAB RRR,No Gallops,Rubs or new Murmurs,  +ve B.Sounds, Abd Soft, No tenderness,   No Cyanosis, Clubbing or edema       Data  Review:    Recent Labs  Lab 08/06/23 1628 08/07/23 0519  WBC  --  8.7  HGB 4.9* 6.7*  HCT 15.4* 20.3*  PLT  --  355  MCV  --  82.9  MCH  --  27.3  MCHC  --  33.0  RDW  --  15.3    Recent Labs  Lab 08/07/23 0519  NA 140  K 4.0  CL 110  CO2 23  ANIONGAP 7  GLUCOSE 99  BUN 6*  CREATININE 0.79  AST 13*  ALT 13  ALKPHOS 97  BILITOT 1.9*  ALBUMIN 3.0*  INR 1.1  MG 2.2  CALCIUM 8.8*      Recent Labs  Lab 08/07/23 0519  INR 1.1  MG 2.2  CALCIUM 8.8*    --------------------------------------------------------------------------------------------------------------- Lab Results  Component Value Date   CHOL 91 06/15/2023   HDL 29 (L) 06/15/2023   LDLCALC 54 06/15/2023   TRIG 40 06/15/2023   CHOLHDL 3.1 06/15/2023    Lab Results  Component Value Date   HGBA1C 6.7 (H) 06/07/2023     Signature  -   Susa Raring M.D on 08/07/2023 at 10:57 AM   -  To page go to [email or web address]

## 2023-08-07 NOTE — Anesthesia Preprocedure Evaluation (Signed)
Anesthesia Evaluation  Patient identified by MRN, date of birth, ID band Patient awake    Reviewed: Allergy & Precautions, NPO status , Patient's Chart, lab work & pertinent test results  History of Anesthesia Complications Negative for: history of anesthetic complications  Airway Mallampati: I  TM Distance: >3 FB Neck ROM: Full    Dental  (+) Edentulous Upper, Missing, Dental Advisory Given,    Pulmonary neg shortness of breath, neg COPD, neg recent URI, Patient abstained from smoking., former smoker   breath sounds clear to auscultation       Cardiovascular hypertension, Pt. on medications (-) angina + Peripheral Vascular Disease  (-) Past MI and (-) CHF  Rhythm:Regular     Neuro/Psych  Headaches, neg Seizures    GI/Hepatic Neg liver ROS,GERD  Medicated and Controlled,,  Endo/Other  diabetes  Lab Results      Component                Value               Date                      HGBA1C                   6.7 (H)             06/07/2023             Renal/GU negative Renal ROSLab Results      Component                Value               Date                      NA                       136                 06/15/2023                K                        3.8                 06/15/2023                CO2                      24                  06/15/2023                GLUCOSE                  124 (H)             06/15/2023                BUN                      12                  06/15/2023                CREATININE  0.76                06/15/2023                CALCIUM                  8.6 (L)             06/15/2023                GFRNONAA                 >60                 06/15/2023                Musculoskeletal  (+) Arthritis ,    Abdominal   Peds  Hematology  (+) Blood dyscrasia, anemia   Anesthesia Other Findings Anemia, black stools  Reproductive/Obstetrics                               Anesthesia Physical Anesthesia Plan  ASA: 3  Anesthesia Plan: MAC   Post-op Pain Management:    Induction: Intravenous  PONV Risk Score and Plan: 2 and TIVA and Treatment may vary due to age or medical condition  Airway Management Planned: Natural Airway and Simple Face Mask  Additional Equipment: None  Intra-op Plan:   Post-operative Plan: Extubation in OR  Informed Consent: I have reviewed the patients History and Physical, chart, labs and discussed the procedure including the risks, benefits and alternatives for the proposed anesthesia with the patient or authorized representative who has indicated his/her understanding and acceptance.     Dental advisory given  Plan Discussed with: CRNA  Anesthesia Plan Comments:         Anesthesia Quick Evaluation

## 2023-08-08 ENCOUNTER — Encounter (HOSPITAL_COMMUNITY): Payer: Self-pay | Admitting: Internal Medicine

## 2023-08-08 DIAGNOSIS — D62 Acute posthemorrhagic anemia: Secondary | ICD-10-CM | POA: Diagnosis not present

## 2023-08-08 DIAGNOSIS — Z7902 Long term (current) use of antithrombotics/antiplatelets: Secondary | ICD-10-CM

## 2023-08-08 DIAGNOSIS — K922 Gastrointestinal hemorrhage, unspecified: Secondary | ICD-10-CM | POA: Diagnosis not present

## 2023-08-08 DIAGNOSIS — K921 Melena: Secondary | ICD-10-CM | POA: Diagnosis not present

## 2023-08-08 LAB — TYPE AND SCREEN
ABO/RH(D): B POS
Antibody Screen: NEGATIVE
Unit division: 0
Unit division: 0
Unit division: 0
Unit division: 0

## 2023-08-08 LAB — BPAM RBC
Blood Product Expiration Date: 202412142359
Blood Product Expiration Date: 202412152359
Blood Product Expiration Date: 202412192359
Blood Product Expiration Date: 202412192359
ISSUE DATE / TIME: 202412010910
ISSUE DATE / TIME: 202412011204
ISSUE DATE / TIME: 202411302047
ISSUE DATE / TIME: 202411302327
Unit Type and Rh: 7300
Unit Type and Rh: 7300
Unit Type and Rh: 7300
Unit Type and Rh: 7300

## 2023-08-08 LAB — CBC WITH DIFFERENTIAL/PLATELET
Abs Immature Granulocytes: 0.04 10*3/uL (ref 0.00–0.07)
Basophils Absolute: 0.1 10*3/uL (ref 0.0–0.1)
Basophils Relative: 1 %
Eosinophils Absolute: 0.4 10*3/uL (ref 0.0–0.5)
Eosinophils Relative: 4 %
HCT: 27.4 % — ABNORMAL LOW (ref 39.0–52.0)
Hemoglobin: 9.3 g/dL — ABNORMAL LOW (ref 13.0–17.0)
Immature Granulocytes: 0 %
Lymphocytes Relative: 22 %
Lymphs Abs: 2.4 10*3/uL (ref 0.7–4.0)
MCH: 28.4 pg (ref 26.0–34.0)
MCHC: 33.9 g/dL (ref 30.0–36.0)
MCV: 83.8 fL (ref 80.0–100.0)
Monocytes Absolute: 1.1 10*3/uL — ABNORMAL HIGH (ref 0.1–1.0)
Monocytes Relative: 10 %
Neutro Abs: 7 10*3/uL (ref 1.7–7.7)
Neutrophils Relative %: 63 %
Platelets: 375 10*3/uL (ref 150–400)
RBC: 3.27 MIL/uL — ABNORMAL LOW (ref 4.22–5.81)
RDW: 15.3 % (ref 11.5–15.5)
WBC: 11.1 10*3/uL — ABNORMAL HIGH (ref 4.0–10.5)
nRBC: 3.7 % — ABNORMAL HIGH (ref 0.0–0.2)

## 2023-08-08 LAB — BASIC METABOLIC PANEL
Anion gap: 6 (ref 5–15)
BUN: 5 mg/dL — ABNORMAL LOW (ref 8–23)
CO2: 25 mmol/L (ref 22–32)
Calcium: 8.6 mg/dL — ABNORMAL LOW (ref 8.9–10.3)
Chloride: 108 mmol/L (ref 98–111)
Creatinine, Ser: 0.74 mg/dL (ref 0.61–1.24)
GFR, Estimated: >60 mL/min (ref >60)
Glucose, Bld: 83 mg/dL (ref 70–99)
Potassium: 3.4 mmol/L — ABNORMAL LOW (ref 3.5–5.1)
Sodium: 139 mmol/L (ref 135–145)

## 2023-08-08 LAB — BRAIN NATRIURETIC PEPTIDE: B Natriuretic Peptide: 110.2 pg/mL — ABNORMAL HIGH (ref 0.0–100.0)

## 2023-08-08 LAB — MAGNESIUM: Magnesium: 2.2 mg/dL (ref 1.7–2.4)

## 2023-08-08 LAB — PHOSPHORUS: Phosphorus: 3.3 mg/dL (ref 2.5–4.6)

## 2023-08-08 MED ORDER — POTASSIUM CHLORIDE 10 MEQ/100ML IV SOLN
10.0000 meq | INTRAVENOUS | Status: AC
  Administered 2023-08-08 – 2023-08-09 (×4): 10 meq via INTRAVENOUS
  Filled 2023-08-08 (×3): qty 100

## 2023-08-08 MED ORDER — MEDIHONEY WOUND/BURN DRESSING EX PSTE
1.0000 | PASTE | Freq: Every day | CUTANEOUS | Status: DC
  Administered 2023-08-08: 1 via TOPICAL
  Filled 2023-08-08 (×2): qty 44

## 2023-08-08 MED ORDER — PEG-KCL-NACL-NASULF-NA ASC-C 100 G PO SOLR: 1.0000 | Freq: Once | ORAL | Status: DC

## 2023-08-08 MED ORDER — PEG-KCL-NACL-NASULF-NA ASC-C 100 G PO SOLR
0.5000 | Freq: Once | ORAL | Status: AC
  Administered 2023-08-08: 100 g via ORAL
  Filled 2023-08-08: qty 1

## 2023-08-08 NOTE — H&P (View-Only) (Signed)
      Progress Note   Subjective  Bowel prep did not clear unfortunately overnight, he is not ready for colonoscopy today. Otherwise feels at baseline without new complaints. No further bleeding reported.   Objective   Vital signs in last 24 hours: Temp:  [97.9 F (36.6 C)-99.1 F (37.3 C)] 98.6 F (37 C) (12/02 0800) Pulse Rate:  [61-82] 76 (12/02 0800) Resp:  [11-21] 18 (12/02 0800) BP: (93-142)/(48-99) 127/89 (12/02 1052) SpO2:  [97 %-100 %] 99 % (12/02 0400) Last BM Date : 08/08/23 General:    AA male in NAD Neurologic:  Alert and oriented,  grossly normal neurologically. Psych:  Cooperative. Normal mood and affect.  Intake/Output from previous day: 12/01 0701 - 12/02 0700 In: 1116.1 [I.V.:27.1; Blood:1089] Out: 1350 [Urine:1350] Intake/Output this shift: No intake/output data recorded.  Lab Results: Recent Labs    08/07/23 0519 08/07/23 2251 08/08/23 0422  WBC 8.7 9.5 11.1*  HGB 6.7* 9.1* 9.3*  HCT 20.3* 27.0* 27.4*  PLT 355 357 375   BMET Recent Labs    08/07/23 0519 08/08/23 0422  NA 140 139  K 4.0 3.4*  CL 110 108  CO2 23 25  GLUCOSE 99 83  BUN 6* 5*  CREATININE 0.79 0.74  CALCIUM 8.8* 8.6*   LFT Recent Labs    08/07/23 0519  PROT 6.3*  ALBUMIN 3.0*  AST 13*  ALT 13  ALKPHOS 97  BILITOT 1.9*   PT/INR Recent Labs    08/07/23 0519  LABPROT 14.6  INR 1.1    Studies/Results: No results found.     Assessment / Plan:    72 y/o male here with the following:  Melena Anemia Antiplatelet therapy - last dose Plavix 11/29  EGD yesterday without clear cause for symptoms. Suspected esophageal candidiasis, biopsies taken, as well as biopsies of his stomach, rule out H pylori, but no ulcers, etc. Scheduled for colonoscopy today to clear his lower tract but his prep was inadequate. Planning for clear liquid diet today with colonoscopy planned for tomorrow AM after another prep this evening. Have discussed what this entails with the  patient and he is agreeable to proceed. Otherwise stable at this time without bleeding overnight. Hgb stable. He agrees with the plan.  PLAN: - clear liquid diet today - more prep this evening - colonoscopy tentatively planned for tomorrow AM - continue to hold Plavix, last dosed 11/29 - await EGD biopsies  Call with questions.  Harlin Rain, MD [facility]

## 2023-08-08 NOTE — Consult Note (Signed)
WOC Nurse Consult Note: Reason for Consult:Requested to assess full thickness wound on amputation site on the left foot.  He is follow by wound center.  Wound type: second toe amputation. PT has pain during the dressing change  Measurement: 5X3X.1 aproxx. Wound bed: 80% yellow, 20% Red  Drainage (amount, consistency, odor) medium drainage, no odor. Periwound: intact Dressing procedure/placement/frequency: Applied Medihoney, change daily.  Add Mepilex [phone] - non adherent dressing before the gauze (change every 2 days),  Wrap with compress and Kerlex.     WOC team will not plan to follow further.  Please reconsult if further assistance is needed. Thank-you,  Denyse Amass BSN, RN, [employer], WOC  (Pager: [phone])

## 2023-08-08 NOTE — Evaluation (Signed)
Physical Therapy Evaluation Patient Details Name: [name] [name] MRN: [id number] DOB: [birth year] Today's Date: 08/08/2023  History of Present Illness  72 y.o. 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  Clinical Impression  Pt presents with admitting diagnosis above. Pt today was able to ambulate in hallway with RW at supervision level. Pt reports PTA that he was Mod I RW and actively receiving HHPT following recent L 2nd ray amputation. Recommend pt continue with HHPT upon DC. PT will continue to follow.       If plan is discharge home, recommend the following: A little help with walking and/or transfers;A little help with bathing/dressing/bathroom;Assistance with cooking/housework;Direct supervision/assist for medications management;Help with stairs or ramp for entrance;Supervision due to cognitive status;Assist for transportation   Can travel by private vehicle        Equipment Recommendations None recommended by PT  Recommendations for Other Services       Functional Status Assessment Patient has had a recent decline in their functional status and demonstrates the ability to make significant improvements in function in a reasonable and predictable amount of time.     Precautions / Restrictions Precautions Precautions: Fall Restrictions Weight Bearing Restrictions: No      Mobility  Bed Mobility Overal bed mobility: Modified Independent                  Transfers Overall transfer level: Needs assistance Equipment used: Rolling walker (2 wheels) Transfers: Sit to/from Stand Sit to  Stand: Supervision           General transfer comment: Cues for hand placement.    Ambulation/Gait Ambulation/Gait assistance: Supervision Gait Distance (Feet): 75 Feet Assistive device: Rolling walker (2 wheels) Gait Pattern/deviations: Decreased stride length, Step-through pattern Gait velocity: decreased     General Gait Details: Increased knee flexion on L to clear postop shoe  Stairs            Wheelchair Mobility     Tilt Bed    Modified Rankin (Stroke Patients Only)       Balance Overall balance assessment: Mild deficits observed, not formally tested                                           Pertinent Vitals/Pain Pain Assessment Pain Assessment: 0-10 Pain Score: 6  Pain Location: L foot Pain Descriptors / Indicators: Discomfort, Grimacing Pain Intervention(s): Monitored during session, Patient requesting pain meds-RN notified, RN gave pain meds during session, Repositioned    Home Living Family/patient expects to be discharged to:: Private residence Living Arrangements: Spouse/significant other Available Help at Discharge: Family;Available PRN/intermittently Type of Home: House Home Access: Stairs to enter Entrance Stairs-Rails: None Entrance Stairs-Number of Steps: 1   Home Layout: One level Home Equipment: Agricultural consultant (2 wheels);Cane - single point      Prior Function Prior Level of Function : Independent/Modified Independent;Needs assist             Mobility Comments: Mod I RW since 2nd ray amputation on 10/2 ADLs Comments: Mod I since surgery. Wife can help as needed  Extremity/Trunk Assessment   Upper Extremity Assessment Upper Extremity Assessment: Overall WFL for tasks assessed    Lower Extremity Assessment Lower Extremity Assessment: LLE deficits/detail LLE Deficits / Details: recent L 2nd ray amputation    Cervical / Trunk Assessment Cervical / Trunk Assessment: Normal  Communication    Communication Communication: No apparent difficulties  Cognition Arousal: Alert Behavior During Therapy: WFL for tasks assessed/performed Overall Cognitive Status: Within Functional Limits for tasks assessed                                          General Comments General comments (skin integrity, edema, etc.): VSS    Exercises     Assessment/Plan    PT Assessment Patient needs continued PT services  PT Problem List Decreased strength;Decreased range of motion;Decreased activity tolerance;Decreased balance;Decreased mobility;Decreased coordination;Decreased knowledge of use of DME;Decreased safety awareness;Decreased knowledge of precautions       PT Treatment Interventions DME instruction;Gait training;Stair training;Functional mobility training;Therapeutic activities;Therapeutic exercise;Balance training;Neuromuscular re-education;Patient/family education    PT Goals (Current goals can be found in the Care Plan section)  Acute Rehab PT Goals Patient Stated Goal: to go home PT Goal Formulation: With patient Time For Goal Achievement: 08/22/23 Potential to Achieve Goals: Good    Frequency Min 1X/week     Co-evaluation               AM-PAC PT "6 Clicks" Mobility  Outcome Measure Help needed turning from your back to your side while in a flat bed without using bedrails?: None Help needed moving from lying on your back to sitting on the side of a flat bed without using bedrails?: None Help needed moving to and from a bed to a chair (including a wheelchair)?: None Help needed standing up from a chair using your arms (e.g., wheelchair or bedside chair)?: A Little Help needed to walk in hospital room?: A Little Help needed climbing 3-5 steps with a railing? : A Little 6 Click Score: 21    End of Session Equipment Utilized During Treatment: Gait belt Activity Tolerance: Patient tolerated treatment well Patient left: in bed;with call bell/phone within  reach Nurse Communication: Mobility status PT Visit Diagnosis: Other abnormalities of gait and mobility (R26.89)    Time: 8295-6213 PT Time Calculation (min) (ACUTE ONLY): 22 min   Charges:   PT Evaluation $PT Eval Moderate Complexity: 1 Mod   PT General Charges $$ ACUTE PT VISIT: 1 Visit         [name] [name], PT, DPT Acute Rehab Services [phone]   Gladys Damme 08/08/2023, 4:13 PM

## 2023-08-08 NOTE — Anesthesia Preprocedure Evaluation (Signed)
Anesthesia Evaluation  Patient identified by MRN, date of birth, ID band Patient awake    Reviewed: Allergy & Precautions, NPO status , Patient's Chart, lab work & pertinent test results  History of Anesthesia Complications Negative for: history of anesthetic complications  Airway Mallampati: I  TM Distance: >3 FB Neck ROM: Full    Dental  (+) Upper Dentures, Edentulous Upper, Missing, Dental Advisory Given   Pulmonary Patient abstained from smoking., former smoker   breath sounds clear to auscultation       Cardiovascular hypertension, Pt. on medications (-) angina + Peripheral Vascular Disease   Rhythm:Regular Rate:Normal  06/2023 ECHO: EF 55-60%, normal LVF, normal RVF, no significant valvular abnormalities   Neuro/Psych negative neurological ROS  negative psych ROS   GI/Hepatic Neg liver ROS,,,melena   Endo/Other  diabetes (glu 100)  BMI 30  Renal/GU negative Renal ROS     Musculoskeletal   Abdominal   Peds  Hematology  (+) Blood dyscrasia (Hb 9.3, plt 375k), anemia plavix   Anesthesia Other Findings   Reproductive/Obstetrics                              Anesthesia Physical Anesthesia Plan  ASA: 3  Anesthesia Plan: MAC   Post-op Pain Management: Minimal or no pain anticipated   Induction:   PONV Risk Score and Plan: 1 and Treatment may vary due to age or medical condition  Airway Management Planned: Natural Airway and Simple Face Mask  Additional Equipment: None  Intra-op Plan:   Post-operative Plan:   Informed Consent: I have reviewed the patients History and Physical, chart, labs and discussed the procedure including the risks, benefits and alternatives for the proposed anesthesia with the patient or authorized representative who has indicated his/her understanding and acceptance.     Dental advisory given  Plan Discussed with: CRNA and Surgeon  Anesthesia Plan  Comments:          Anesthesia Quick Evaluation

## 2023-08-08 NOTE — Progress Notes (Signed)
PROGRESS NOTE                                                                                                                                                                                                             Patient Demographics:    [name] [name], is a 72 y.o. male, DOB - Apr 04, 1951, UXL:[id number]  Outpatient Primary MD for the patient is Ignatius Specking, MD    LOS - 2  Admit date - 08/06/2023    No chief complaint on file.      Brief Narrative (HPI from H&P)   72 y.o. 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  The ER workup suggestive of severe upper GI bleed related anemia and he was admitted.    Subjective:   Patient in bed, appears comfortable, denies any headache, no fever, no chest pain or pressure, no shortness of breath , no abdominal pain. No focal weakness.   Assessment  & Plan :    Acute blood loss anemia likely upper GI bleed. On DAPT held, received 2 units of packed RBC transfusions already getting third unit on 08/07/2023, posttransfusion H&H stable, IV PPI, GI on board underwent EGD 08/07/2023 showing duodenitis but no clear signs of bleeding, colonoscopy due 08/08/2023.  Continue to monitor CBC.   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  Outpatient follow-up with vascular  surgery/orthopedics -Aspirin and Plavix on hold.   Elevated troponin -Possibly from demand ischemia from GI bleed.  Will check lipid hide since he troponin.  Monitor on troponin.  No chest pain reported.   Hypertension -Continue amlodipine   Hyperlipidemia -Continue Lipitor        Condition - Fair  Family Communication  :  wife at side on 08/07/2023  Code Status :  Full  Consults  :  GI  PUD Prophylaxis : PPI   Procedures  :  EGD 08/07/2023.  Duodenitis.  Colonoscopy.  08/08/2023      Disposition Plan  :    Status is: Inpatient   DVT Prophylaxis  :    SCDs Start: 08/06/23 1611    Lab Results  Component Value Date   PLT 375 08/08/2023    Diet :  Diet Order             Diet NPO time specified  Diet effective midnight                    Inpatient Medications  Scheduled Meds:  amLODipine  5 mg Oral Daily   atorvastatin  40 mg Oral QHS   leptospermum manuka honey  1 Application Topical Daily   pantoprazole (PROTONIX) IV  40 mg Intravenous Q12H   zinc sulfate (50mg  elemental zinc)  220 mg Oral Daily   Continuous Infusions:  sodium chloride     potassium chloride 10 mEq (08/08/23 0806)   PRN Meds:.acetaminophen **OR** acetaminophen, HYDROcodone-acetaminophen  Antibiotics  :    Anti-infectives (From admission, onward)    None         Objective:   Vitals:   08/08/23 0200 08/08/23 0300 08/08/23 0400 08/08/23 0800  BP:   (!) 117/48 132/83  Pulse: 71 70 77 76  Resp:   18 18  Temp:   98.6 F (37 C) 98.6 F (37 C)  TempSrc:   Oral Oral  SpO2: 97% 97% 99%   Weight:        Wt Readings from Last 3 Encounters:  08/07/23 90.2 kg  07/15/23 90.2 kg  06/20/23 88.5 kg     Intake/Output Summary (Last 24 hours) at 08/08/2023 0911 Last data filed at 08/07/2023 1600 Gross per 24 hour  Intake 1116.06 ml  Output 1350 ml  Net -233.94 ml     Physical Exam  Awake Alert, No new F.N deficits, Normal affect [place].AT,PERRAL Supple Neck,  No JVD,   Symmetrical Chest wall movement, Good air movement bilaterally, CTAB RRR,No Gallops,Rubs or new Murmurs,  +ve B.Sounds, Abd Soft, No tenderness,   No Cyanosis, Clubbing or edema       Data Review:    Recent Labs  Lab 08/06/23 1628 08/07/23 0519 08/07/23 2251 08/08/23 0422  WBC  --  8.7 9.5 11.1*  HGB 4.9* 6.7* 9.1* 9.3*  HCT 15.4* 20.3* 27.0* 27.4*  PLT  --  355 357 375  MCV  --  82.9 82.3 83.8  MCH  --  27.3 27.7 28.4  MCHC  --  33.0 33.7 33.9  RDW  --  15.3 15.1 15.3  LYMPHSABS  --   --   --  2.4  MONOABS  --   --   --  1.1*  EOSABS  --   --   --  0.4  BASOSABS  --   --   --  0.1    Recent Labs  Lab 08/07/23 0519 08/08/23 0422  NA 140 139  K 4.0 3.4*  CL 110 108  CO2 23 25  ANIONGAP 7 6  GLUCOSE 99 83  BUN 6* 5*  CREATININE 0.79 0.74  AST 13*  --   ALT 13  --   ALKPHOS 97  --   BILITOT 1.9*  --   ALBUMIN 3.0*  --   INR 1.1  --   BNP  --  110.2*  MG 2.2 2.2  CALCIUM 8.8* 8.6*      Recent Labs  Lab 08/07/23 0519  08/08/23 0422  INR 1.1  --   BNP  --  110.2*  MG 2.2 2.2  CALCIUM 8.8* 8.6*    --------------------------------------------------------------------------------------------------------------- Lab Results  Component Value Date   CHOL 91 06/15/2023   HDL 29 (L) 06/15/2023   LDLCALC 54 06/15/2023   TRIG 40 06/15/2023   CHOLHDL 3.1 06/15/2023    Lab Results  Component Value Date   HGBA1C 6.7 (H) 06/07/2023     Signature  -   Susa Raring M.D on 08/08/2023 at 9:11 AM   -  To page go to [email or web address]

## 2023-08-08 NOTE — Progress Notes (Signed)
      Progress Note   Subjective  Bowel prep did not clear unfortunately overnight, he is not ready for colonoscopy today. Otherwise feels at baseline without new complaints. No further bleeding reported.   Objective   Vital signs in last 24 hours: Temp:  [97.9 F (36.6 C)-99.1 F (37.3 C)] 98.6 F (37 C) (12/02 0800) Pulse Rate:  [61-82] 76 (12/02 0800) Resp:  [11-21] 18 (12/02 0800) BP: (93-142)/(48-99) 127/89 (12/02 1052) SpO2:  [97 %-100 %] 99 % (12/02 0400) Last BM Date : 08/08/23 General:    AA male in NAD Neurologic:  Alert and oriented,  grossly normal neurologically. Psych:  Cooperative. Normal mood and affect.  Intake/Output from previous day: 12/01 0701 - 12/02 0700 In: 1116.1 [I.V.:27.1; Blood:1089] Out: 1350 [Urine:1350] Intake/Output this shift: No intake/output data recorded.  Lab Results: Recent Labs    08/07/23 0519 08/07/23 2251 08/08/23 0422  WBC 8.7 9.5 11.1*  HGB 6.7* 9.1* 9.3*  HCT 20.3* 27.0* 27.4*  PLT 355 357 375   BMET Recent Labs    08/07/23 0519 08/08/23 0422  NA 140 139  K 4.0 3.4*  CL 110 108  CO2 23 25  GLUCOSE 99 83  BUN 6* 5*  CREATININE 0.79 0.74  CALCIUM 8.8* 8.6*   LFT Recent Labs    08/07/23 0519  PROT 6.3*  ALBUMIN 3.0*  AST 13*  ALT 13  ALKPHOS 97  BILITOT 1.9*   PT/INR Recent Labs    08/07/23 0519  LABPROT 14.6  INR 1.1    Studies/Results: No results found.     Assessment / Plan:    72 y/o male here with the following:  Melena Anemia Antiplatelet therapy - last dose Plavix 11/29  EGD yesterday without clear cause for symptoms. Suspected esophageal candidiasis, biopsies taken, as well as biopsies of his stomach, rule out H pylori, but no ulcers, etc. Scheduled for colonoscopy today to clear his lower tract but his prep was inadequate. Planning for clear liquid diet today with colonoscopy planned for tomorrow AM after another prep this evening. Have discussed what this entails with the  patient and he is agreeable to proceed. Otherwise stable at this time without bleeding overnight. Hgb stable. He agrees with the plan.  PLAN: - clear liquid diet today - more prep this evening - colonoscopy tentatively planned for tomorrow AM - continue to hold Plavix, last dosed 11/29 - await EGD biopsies  Call with questions.  Harlin Rain, MD [facility]

## 2023-08-08 NOTE — TOC CM/SW Note (Signed)
Transition of Care [facility]) - Inpatient Brief Assessment   Patient Details  Name: [name] [name] MRN: [id number] Date of Birth: Jan 06, 1951  [facility]) CM/SW Contact:    Mearl Latin, LCSW Phone Number: 08/08/2023, 6:48 PM   Clinical Narrative: Patient admitted from home with spouse. TOC following for home health set up. Patient most recently used [facility] home health. Will follow up.    Transition of Care Asessment: Insurance and Status: Insurance coverage has been reviewed Patient has primary care physician: Yes Home environment has been reviewed: From home Prior level of function:: Indepdent Prior/Current Home Services: No current home services Social Determinants of Health Reivew: SDOH reviewed no interventions necessary Readmission risk has been reviewed: Yes Transition of care needs: transition of care needs identified, TOC will continue to follow

## 2023-08-08 NOTE — Progress Notes (Signed)
Verbal order with read back received from Dr. Susa Raring to put patient on full liquid diet for today and MN NPO for 12/03 as colonoscopy is cancelled.

## 2023-08-09 ENCOUNTER — Other Ambulatory Visit (HOSPITAL_COMMUNITY): Payer: Self-pay

## 2023-08-09 ENCOUNTER — Inpatient Hospital Stay (HOSPITAL_COMMUNITY): Payer: Medicare HMO | Admitting: Certified Registered"

## 2023-08-09 ENCOUNTER — Encounter (HOSPITAL_COMMUNITY): Payer: Self-pay | Admitting: Internal Medicine

## 2023-08-09 ENCOUNTER — Encounter (HOSPITAL_COMMUNITY)
Disposition: A | Discharge status: Home Health | Payer: Self-pay | Source: Home / Self Care | Attending: Internal Medicine

## 2023-08-09 DIAGNOSIS — K921 Melena: Secondary | ICD-10-CM | POA: Diagnosis not present

## 2023-08-09 DIAGNOSIS — D123 Benign neoplasm of transverse colon: Secondary | ICD-10-CM | POA: Diagnosis not present

## 2023-08-09 DIAGNOSIS — K922 Gastrointestinal hemorrhage, unspecified: Secondary | ICD-10-CM | POA: Diagnosis not present

## 2023-08-09 DIAGNOSIS — K648 Other hemorrhoids: Secondary | ICD-10-CM

## 2023-08-09 DIAGNOSIS — D62 Acute posthemorrhagic anemia: Secondary | ICD-10-CM | POA: Diagnosis not present

## 2023-08-09 DIAGNOSIS — D128 Benign neoplasm of rectum: Secondary | ICD-10-CM

## 2023-08-09 DIAGNOSIS — K5520 Angiodysplasia of colon without hemorrhage: Secondary | ICD-10-CM

## 2023-08-09 DIAGNOSIS — Q438 Other specified congenital malformations of intestine: Secondary | ICD-10-CM

## 2023-08-09 HISTORY — PX: HOT HEMOSTASIS: SHX5433

## 2023-08-09 HISTORY — PX: COLONOSCOPY WITH PROPOFOL: SHX5780

## 2023-08-09 LAB — CBC WITH DIFFERENTIAL/PLATELET
Abs Immature Granulocytes: 0.04 10*3/uL (ref 0.00–0.07)
Basophils Absolute: 0.1 10*3/uL (ref 0.0–0.1)
Basophils Relative: 1 %
Eosinophils Absolute: 0.4 10*3/uL (ref 0.0–0.5)
Eosinophils Relative: 4 %
HCT: 30.8 % — ABNORMAL LOW (ref 39.0–52.0)
Hemoglobin: 10.1 g/dL — ABNORMAL LOW (ref 13.0–17.0)
Immature Granulocytes: 0 %
Lymphocytes Relative: 19 %
Lymphs Abs: 1.9 10*3/uL (ref 0.7–4.0)
MCH: 27.7 pg (ref 26.0–34.0)
MCHC: 32.8 g/dL (ref 30.0–36.0)
MCV: 84.6 fL (ref 80.0–100.0)
Monocytes Absolute: 0.9 10*3/uL (ref 0.1–1.0)
Monocytes Relative: 9 %
Neutro Abs: 7 10*3/uL (ref 1.7–7.7)
Neutrophils Relative %: 67 %
Platelets: 404 10*3/uL — ABNORMAL HIGH (ref 150–400)
RBC: 3.64 MIL/uL — ABNORMAL LOW (ref 4.22–5.81)
RDW: 16 % — ABNORMAL HIGH (ref 11.5–15.5)
WBC: 10.3 10*3/uL (ref 4.0–10.5)
nRBC: 9.5 % — ABNORMAL HIGH (ref 0.0–0.2)

## 2023-08-09 LAB — BASIC METABOLIC PANEL
Anion gap: 8 (ref 5–15)
BUN: 5 mg/dL — ABNORMAL LOW (ref 8–23)
CO2: 23 mmol/L (ref 22–32)
Calcium: 9 mg/dL (ref 8.9–10.3)
Chloride: 110 mmol/L (ref 98–111)
Creatinine, Ser: 0.73 mg/dL (ref 0.61–1.24)
GFR, Estimated: >60 mL/min (ref >60)
Glucose, Bld: 100 mg/dL — ABNORMAL HIGH (ref 70–99)
Potassium: 3.5 mmol/L (ref 3.5–5.1)
Sodium: 141 mmol/L (ref 135–145)

## 2023-08-09 LAB — PHOSPHORUS: Phosphorus: 3 mg/dL (ref 2.5–4.6)

## 2023-08-09 LAB — MAGNESIUM: Magnesium: 2.2 mg/dL (ref 1.7–2.4)

## 2023-08-09 LAB — BRAIN NATRIURETIC PEPTIDE: B Natriuretic Peptide: 41 pg/mL (ref 0.0–100.0)

## 2023-08-09 SURGERY — COLONOSCOPY WITH PROPOFOL
Anesthesia: Monitor Anesthesia Care

## 2023-08-09 MED ORDER — PROPOFOL 500 MG/50ML IV EMUL
INTRAVENOUS | Status: DC | PRN
  Administered 2023-08-09: 50 mg via INTRAVENOUS
  Administered 2023-08-09: 140 ug/kg/min via INTRAVENOUS

## 2023-08-09 MED ORDER — EPHEDRINE SULFATE (PRESSORS) 50 MG/ML IJ SOLN
INTRAMUSCULAR | Status: DC | PRN
  Administered 2023-08-09: 5 mg via INTRAVENOUS
  Administered 2023-08-09: 10 mg via INTRAVENOUS

## 2023-08-09 MED ORDER — PHENYLEPHRINE HCL (PRESSORS) 10 MG/ML IV SOLN
INTRAVENOUS | Status: DC | PRN
Start: 2023-08-09 — End: 2023-08-09
  Administered 2023-08-09 (×2): 160 ug via INTRAVENOUS
  Administered 2023-08-09 (×2): 80 ug via INTRAVENOUS

## 2023-08-09 MED ORDER — PANTOPRAZOLE SODIUM 40 MG PO TBEC
40.0000 mg | DELAYED_RELEASE_TABLET | Freq: Two times a day (BID) | ORAL | Status: DC
  Administered 2023-08-09: 40 mg via ORAL
  Filled 2023-08-09: qty 1

## 2023-08-09 MED ORDER — PHENYLEPHRINE HCL-NACL 20-0.9 MG/250ML-% IV SOLN
INTRAVENOUS | Status: DC | PRN
  Administered 2023-08-09: 70 ug/min via INTRAVENOUS

## 2023-08-09 MED ORDER — PHENYLEPHRINE HCL-NACL 20-0.9 MG/250ML-% IV SOLN
INTRAVENOUS | Status: AC
  Filled 2023-08-09: qty 250

## 2023-08-09 MED ORDER — PANTOPRAZOLE SODIUM 40 MG PO TBEC
40.0000 mg | DELAYED_RELEASE_TABLET | Freq: Two times a day (BID) | ORAL | 2 refills | Status: AC
  Filled 2023-08-09: qty 60, 30d supply, fill #0

## 2023-08-09 MED ORDER — ASPIRIN 81 MG PO CHEW: 81.0000 mg | CHEWABLE_TABLET | Freq: Every day | ORAL | Status: AC

## 2023-08-09 MED ORDER — SODIUM CHLORIDE 0.9 % IV SOLN: INTRAVENOUS | Status: DC | PRN

## 2023-08-09 MED ORDER — CLOPIDOGREL BISULFATE 75 MG PO TABS: 75.0000 mg | ORAL_TABLET | Freq: Every day | ORAL | Status: DC

## 2023-08-09 MED ORDER — PROPOFOL 1000 MG/100ML IV EMUL
INTRAVENOUS | Status: AC
Start: 2023-08-09 — End: ?
  Filled 2023-08-09: qty 200

## 2023-08-09 MED ORDER — POTASSIUM CHLORIDE 10 MEQ/100ML IV SOLN
10.0000 meq | INTRAVENOUS | Status: AC
  Administered 2023-08-09: 10 meq via INTRAVENOUS
  Filled 2023-08-09 (×2): qty 100

## 2023-08-09 SURGICAL SUPPLY — 21 items
ELECT REM PT RETURN 9FT ADLT (ELECTROSURGICAL) IMPLANT
ELECTRODE REM PT RTRN 9FT ADLT (ELECTROSURGICAL) IMPLANT
FCP BXJMBJMB 240X2.8X (CUTTING FORCEPS)
FLOOR PAD 36X40 (MISCELLANEOUS) ×1 IMPLANT
FORCEPS BIOP RAD 4 LRG CAP 4 (CUTTING FORCEPS) IMPLANT
FORCEPS BIOP RJ4 240 W/NDL (CUTTING FORCEPS) IMPLANT
FORCEPS BXJMBJMB 240X2.8X (CUTTING FORCEPS) IMPLANT
INJECTOR/SNARE I SNARE (MISCELLANEOUS) IMPLANT
LUBRICANT JELLY 4.5OZ STERILE (MISCELLANEOUS) IMPLANT
MANIFOLD NEPTUNE II (INSTRUMENTS) IMPLANT
NDL SCLEROTHERAPY 25GX240 (NEEDLE) IMPLANT
NEEDLE SCLEROTHERAPY 25GX240 (NEEDLE) IMPLANT
PAD FLOOR 36X40 (MISCELLANEOUS) ×1 IMPLANT
PROBE APC STR FIRE (PROBE) IMPLANT
PROBE INJECTION GOLD 7FR (MISCELLANEOUS) IMPLANT
SNARE ROTATE MED OVAL 20MM (MISCELLANEOUS) IMPLANT
SYR 50ML LL SCALE MARK (SYRINGE) IMPLANT
TRAP SPECIMEN MUCOUS 40CC (MISCELLANEOUS) IMPLANT
TUBING ENDO SMARTCAP PENTAX (MISCELLANEOUS) IMPLANT
TUBING IRRIGATION ENDOGATOR (MISCELLANEOUS) ×1 IMPLANT
WATER STERILE IRR 1000ML POUR (IV SOLUTION) IMPLANT

## 2023-08-09 NOTE — TOC Transition Note (Addendum)
Transition of Care [facility]) - CM/SW Discharge Note   Patient Details  Name: [name] [name] MRN: [id number] Date of Birth: [birth year]  [facility] CM/SW Contact:  Harriet Masson, RN Phone Number: 08/09/2023, 11:31 AM   Clinical Narrative:    Patient stable for discharge.  Resumption home health orders placed for hH-PT, SN Wife will transport home.  Address, Phone number and PCP verified.    Final next level of care: Home w Home Health Services Barriers to Discharge: Barriers Resolved   Patient Goals and CMS Choice CMS Medicare.gov Compare Post Acute Care list provided to:: Patient Choice offered to / list presented to : Patient  Discharge Placement                   home      Discharge Plan and Services Additional resources added to the After Visit Summary for                            [facility] Arranged: PT HH Agency: [facility] Date [facility] Agency Contacted: 08/09/23 Time HH Agency Contacted: 1131 Representative spoke with at [facility] Agency: [place]  Social Determinants of Health (SDOH) Interventions SDOH Screenings   Food Insecurity: No Food Insecurity (08/06/2023)  Housing: Patient Declined (08/06/2023)  Transportation Needs: No Transportation Needs (08/06/2023)  Utilities: Patient Declined (08/06/2023)  Financial Resource Strain: Low Risk  (06/02/2023)   Received from [facility]  Tobacco Use: Medium Risk (08/09/2023)  Health Literacy: Medium Risk (06/02/2023)   [facility]     Readmission Risk Interventions    08/09/2023   11:31 AM 06/21/2023    1:32 PM  Readmission Risk Prevention Plan  Post Dischage Appt Complete   Medication Screening Complete   Transportation Screening Complete Complete  Home Care Screening  Complete  Medication Review (RN CM)  Complete

## 2023-08-09 NOTE — Discharge Instructions (Signed)
Follow with Primary MD Doreen Beam B, MD in 7 days you need CBC, BMP rechecked every week for the next 2 weeks by your PCP.  Activity: As tolerated with Full fall precautions use walker/cane & assistance as needed  Disposition Home   Diet: Heart Healthy   Special Instructions: If you have smoked or chewed Tobacco  in the last 2 yrs please stop smoking, stop any regular Alcohol  and or any Recreational drug use.  On your next visit with your primary care physician please Get Medicines reviewed and adjusted.  Please request your Prim.MD to go over all Hospital Tests and Procedure/Radiological results at the follow up, please get all Hospital records sent to your Prim MD by signing hospital release before you go home.  If you experience worsening of your admission symptoms, develop shortness of breath, life threatening emergency, suicidal or homicidal thoughts you must seek medical attention immediately by calling 911 or calling your MD immediately  if symptoms less severe.  You Must read complete instructions/literature along with all the possible adverse reactions/side effects for all the Medicines you take and that have been prescribed to you. Take any new Medicines after you have completely understood and accpet all the possible adverse reactions/side effects.   Do not drive when taking Pain medications.  Do not take more than prescribed Pain, Sleep and Anxiety Medications

## 2023-08-09 NOTE — Plan of Care (Signed)
  Problem: Acute Rehab PT Goals(only PT should resolve) Goal: Pt Will Ambulate Outcome: Adequate for Discharge

## 2023-08-09 NOTE — Op Note (Signed)
[facility] Patient Name: [name] [name] Procedure Date : 08/09/2023 MRN: [id number] Attending MD: Willaim Rayas. Adela Lank , MD, [id number] Date of Birth: [birth year] CSN: [id number] Age: 72 Admit Type: Inpatient Procedure:                Colonoscopy Indications:              Melena / dark stools - normal BUN, negative EGD. On                            Plavix - last dose 11/29 - 4 days ago Providers:                Viviann Spare P. Adela Lank, MD, Eliberto Ivory, RN, Kandice Robinsons, Technician Referring MD:              Medicines:                Monitored Anesthesia Care Complications:            No immediate complications. Estimated blood loss:                            Minimal. Estimated Blood Loss:     Estimated blood loss was minimal. Procedure:                Pre-Anesthesia Assessment:                           - Prior to the procedure, a History and Physical                            was performed, and patient medications and                            allergies were reviewed. The patient's tolerance of                            previous anesthesia was also reviewed. The risks                            and benefits of the procedure and the sedation                            options and risks were discussed with the patient.                            All questions were answered, and informed consent                            was obtained. Prior Anticoagulants: The patient has                            taken no anticoagulant or antiplatelet agents. ASA  Grade Assessment: III - A patient with severe                            systemic disease. After reviewing the risks and                            benefits, the patient was deemed in satisfactory                            condition to undergo the procedure.                           After obtaining informed consent, the colonoscope                            was passed  under direct vision. Throughout the                            procedure, the patient's blood pressure, pulse, and                            oxygen saturations were monitored continuously. The                            [id number]-HQ190L ([id number]) Olympus coloscope was                            introduced through the anus and advanced to the the                            cecum, identified by appendiceal orifice and                            ileocecal valve. The colonoscopy was performed                            without difficulty. The patient tolerated the                            procedure well. The quality of the bowel                            preparation was good. The ileocecal valve,                            appendiceal orifice, and rectum were photographed. Scope In: 7:53:00 AM Scope Out: 8:20:03 AM Scope Withdrawal Time: 0 hours 20 minutes 29 seconds  Total Procedure Duration: 0 hours 27 minutes 3 seconds  Findings:      The perianal and digital rectal examinations were normal.      A single angiodysplastic lesion was found in the cecum. Fulguration to       ablate the lesion to prevent bleeding by argon plasma was successful.      A single angiodysplastic lesion was found in the ascending colon.  Fulguration to ablate the lesion to prevent bleeding by argon plasma was       successful.      Five sessile polyps were found in the transverse colon. The polyps were       small in size. They were not removed given recent Plavix use and not to       confound his course should bleeding recur.      A 5 mm polyp was found in the proximal rectum. The polyp was sessile. It       was not removed today given recent Plavix use and not to confound his       course should bleeding recur.      The colon was quite tortuous. Abdominal pressure used to achieve cecal       intubation. The IC valve was angulated, ileal intubation not successful.      Internal hemorrhoids were found during  retroflexion.      The exam was otherwise without abnormality. Impression:               - A single colonic angiodysplastic lesion. Treated                            with argon plasma coagulation (APC).                           - A single colonic angiodysplastic lesion. Treated                            with argon plasma coagulation (APC).                           - Five small polyps in the transverse colon.                           - One 5 mm polyp in the proximal rectum.                           - Tortuous colon.                           - Internal hemorrhoids.                           - The examination was otherwise normal.                           - No polyps removed today as outlined above.                           Right sided AVMs, particularly cecal lesion, could                            have bled causing anemia / symptoms. Await course                            post ablation, if anemia recurs will then need  capsule endoscopy to assess the small bowel. Recommendation:           - Return patient to hospital ward for ongoing care.                           - Advance diet as tolerated.                           - Continue present medications.                           - Patient has not had symptoms of bleeding for at                            least a few days - Hgb stable - I think okay to go                            home later today with close follow up of labs, will                            check Hgb later this week and next week. Our office                            can coordinate.                           - Resume Plavix in 2-3 days                           - If any rebleeding / worsening anemia, would                            proceed with capsule endoscopy to clear small bowel                           - He will need a repeat colonoscopy for removal of                            multiple small polyps in upcoming months, can do as                             outpatient. Not removed today as outlined above                           - We will sign off for now, please call with                            questions moving forward Procedure Code(s):        --- Professional ---                           [phone], Colonoscopy, flexible; with control of  bleeding, any method Diagnosis Code(s):        --- Professional ---                           K55.20, Angiodysplasia of colon without hemorrhage                           D12.3, Benign neoplasm of transverse colon (hepatic                            flexure or splenic flexure)                           D12.8, Benign neoplasm of rectum                           K64.8, Other hemorrhoids                           K92.1, Melena (includes Hematochezia)                           Q43.8, Other specified congenital malformations of                            intestine CPT copyright 2022 [employer]. All rights reserved. The codes documented in this report are preliminary and upon coder review may  be revised to meet current compliance requirements. Viviann Spare P. Arrion Burruel, MD 08/09/2023 8:36:12 AM This report has been signed electronically. Number of Addenda: 0

## 2023-08-09 NOTE — Progress Notes (Signed)
Mobility Specialist Progress Note:   08/09/23 1200  Mobility  Activity Ambulated with assistance in hallway  Level of Assistance Contact guard assist, steadying assist  Assistive Device Front wheel walker  Distance Ambulated (ft) 60 ft  Activity Response Tolerated well  Mobility Referral Yes  $Mobility charge 1 Mobility  Mobility Specialist Start Time (ACUTE ONLY) 1200  Mobility Specialist Stop Time (ACUTE ONLY) 1215  Mobility Specialist Time Calculation (min) (ACUTE ONLY) 15 min   Pt agreeable to mobility, eager for d/c. Required only minG assist during ambulation with RW and darco shoe. No c/o throughout. Pt left sitting EOB with all needs met.   Addison Lank Mobility Specialist Please contact via SecureChat or  Rehab office at [phone]

## 2023-08-09 NOTE — Progress Notes (Signed)
Discharge instructions (including medications) discussed with and copy provided to patient/caregiver  Patient is waiting on his clothes and is refusing the discharge lounge.

## 2023-08-09 NOTE — Interval H&P Note (Signed)
History and Physical Interval Note: No events overnight. He reports he drank prep and he cleared his bowels for the exam today. I have discussed risks / benefits of the exams. He understands. Plavix out of his system for 4 days now - will see if we can identify clear source of his bleeding / anemia. If no cause on this exam, may need capsule study. Further recommendations pending the results. He agrees.   08/09/2023 7:19 AM  [name] [name]  has presented today for surgery, with the diagnosis of Melena, anemia.  The various methods of treatment have been discussed with the patient and family. After consideration of risks, benefits and other options for treatment, the patient has consented to  Procedure(s): COLONOSCOPY WITH PROPOFOL (N/A) as a surgical intervention.  The patient's history has been reviewed, patient examined, no change in status, stable for surgery.  I have reviewed the patient's chart and labs.  Questions were answered to the patient's satisfaction.     Viviann Spare P Jozelynn Danielson

## 2023-08-09 NOTE — Discharge Summary (Signed)
[name] [name] WUJ:[id number] DOB: 03-10-1951 DOA: 08/06/2023  PCP: Ignatius Specking, MD  Admit date: 08/06/2023  Discharge date: 08/09/2023  Admitted From: Home   Disposition:  Home   Recommendations for Outpatient Follow-up:   Follow up with PCP in 1-2 weeks  PCP Please obtain BMP/CBC, 2 view CXR in 1week,  (see Discharge instructions)   PCP Please follow up on the following pending results: Monitor CBC every week for 2 weeks.  Needs close outpatient follow-up with [facility]   Home Health: None   Equipment/Devices: None  Consultations: GI Discharge Condition: Stable    CODE STATUS: Full    Diet Recommendation: Heart Healthy    No chief complaint on file.    Brief history of present illness from the day of admission and additional interim summary     72 y.o. 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  The ER workup suggestive of severe upper GI bleed related anemia and he was admitted.                                                                  Hospital Course   Acute blood loss anemia likely upper GI bleed. On DAPT held, received 2 units of packed RBC transfusions already getting third unit on 08/07/2023, posttransfusion H&H stable, IV PPI, GI on board underwent EGD 08/07/2023 showing duodenitis but no clear signs of bleeding, colonoscopy today Case discussed with Dr. 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  Plan is to discharge home on PPI, resume aspirin and Plavix in 3 to 4 days PCP to monitor CBC every week for 2 weeks.  Will require outpatient GI follow-up in 2 to 3 weeks.   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  Outpatient follow-up with vascular surgery/orthopedics -Aspirin and Plavix on hold.  Just with GI resume in the next 3 to 4 days with caution.   Elevated troponin -Possibly from demand ischemia from GI bleed.  Will check lipid hide since he troponin.  Monitor on troponin.  No chest pain reported.   Hypertension -Continue amlodipine   Hyperlipidemia -Continue Lipitor    Discharge diagnosis     Principal Problem:   GI bleed Active Problems:   Melena   Acute post-hemorrhagic anemia   Antiplatelet or antithrombotic long-term use  AVM (arteriovenous malformation) of colon    Discharge instructions    Discharge Instructions     Diet - low sodium heart healthy   Complete by: As directed    Discharge instructions   Complete by: As directed    Follow with Primary MD Doreen Beam B, MD in 7 days you need CBC, BMP rechecked every week for the next 2 weeks by your PCP.  Activity: As tolerated with Full fall precautions use walker/cane & assistance as needed  Disposition Home   Diet: Heart Healthy   Special Instructions: If you have smoked or chewed Tobacco  in the last 2 yrs please stop smoking, stop any regular Alcohol  and or any Recreational drug use.  On your next visit with your primary care physician please Get Medicines reviewed and adjusted.  Please request your Prim.MD to go over all Hospital Tests and Procedure/Radiological results at the follow up, please get all Hospital records sent to your Prim MD by  signing hospital release before you go home.  If you experience worsening of your admission symptoms, develop shortness of breath, life threatening emergency, suicidal or homicidal thoughts you must seek medical attention immediately by calling 911 or calling your MD immediately  if symptoms less severe.  You Must read complete instructions/literature along with all the possible adverse reactions/side effects for all the Medicines you take and that have been prescribed to you. Take any new Medicines after you have completely understood and accpet all the possible adverse reactions/side effects.   Do not drive when taking Pain medications.  Do not take more than prescribed Pain, Sleep and Anxiety Medications   Increase activity slowly   Complete by: As directed    No wound care   Complete by: As directed        Discharge Medications   Allergies as of 08/09/2023       Reactions   Gadobutrol Hives, Itching, Swelling   After injection of MRI contrast Gadavist pt experienced facial itching, stuffy nose, eye swelling   Iodinated Contrast Media Hives        Medication List     STOP taking these medications    amoxicillin-clavulanate 875-125 MG tablet Commonly known as: AUGMENTIN       TAKE these medications    amLODipine 5 MG tablet Commonly known as: NORVASC Take 1 tablet (5 mg total) by mouth daily.   Ascorbic Acid 1000 MG Tbcr Take 1,000 mg by mouth in the morning and at bedtime.   aspirin 81 MG chewable tablet Chew 1 tablet (81 mg total) by mouth daily. Start taking on: August 12, 2023 What changed: These instructions start on August 12, 2023. If you are unsure what to do until then, ask your doctor or other care provider.   atorvastatin 40 MG tablet Commonly known as: LIPITOR Take 1 tablet (40 mg total) by mouth daily.   clopidogrel 75 MG tablet Commonly known as: PLAVIX Take 1 tablet (75 mg total) by mouth daily with breakfast. Start taking on: August 13, 2023 What changed: These instructions start on August 13, 2023. If you are unsure what to do until then, ask your doctor or other care provider.   [employer] MANUKA HONEY WOUND EX Apply 1 Application topically as needed (for bandage changes).   cyanocobalamin 1000 MCG tablet Commonly known as: VITAMIN B12 Take 1,000 mcg by mouth daily.   ferrous sulfate 325 (65 FE) MG tablet Take 325 mg by mouth daily with breakfast.  gabapentin 300 MG capsule Commonly known as: NEURONTIN Take 300 mg by mouth 3 (three) times daily.   pantoprazole 40 MG tablet Commonly known as: PROTONIX Take 1 tablet (40 mg total) by mouth 2 (two) times daily.   vitamin E 45 MG (100 UNITS) capsule Take 100 Units by mouth daily.   Zinc Acetate 50 MG Caps Take 50 mg by mouth in the morning.         Follow-up Information     Vyas, Dhruv B, MD. Schedule an appointment as soon as possible for a visit in 1 week(s).   Specialty: Internal Medicine Contact information: [address] [place] [address] [phone]         [facility]. Schedule an appointment as soon as possible for a visit in 2 week(s).   Specialty: Gastroenterology Contact information: [address] [place] [address] [phone]                Major procedures and Radiology Reports - PLEASE review detailed and final reports thoroughly  -        VAS [place] LOWER EXTREMITY ARTERIAL DUPLEX  Result Date: 07/15/2023 LOWER EXTREMITY ARTERIAL DUPLEX STUDY Patient Name:  [name] [name]  Date of Exam:   07/15/2023 Medical Rec #: [id number]       Accession #:    [id number] Date of Birth: 29-Jul-1951       Patient Gender: M Patient Age:   55 years Exam Location:  [facility] Procedure:      VAS [place] LOWER EXTREMITY ARTERIAL DUPLEX Referring Phys: Carolynn Sayers --------------------------------------------------------------------------------  Indications: Peripheral artery disease.   Vascular Interventions: 06/14/2023: Left mid SFA to posterior tibial artery                         bypass with reversed GSV. Current ABI:            R=0.68, L=1.13 Limitations: Limited acoustic penetration due to edema Performing Technologist: Dorthula Matas RVS, RCS  Examination Guidelines: A complete evaluation includes B-mode imaging, spectral Doppler, color Doppler, and power Doppler as needed of all accessible portions of each vessel. Bilateral testing is considered an integral part of a complete examination. Limited examinations for reoccurring indications may be performed as noted.   +----------+--------+-----+---------------+--------+--------+ LEFT      PSV cm/sRatioStenosis       WaveformComments +----------+--------+-----+---------------+--------+--------+ CFA Distal204          50-74% stenosis                 +----------+--------+-----+---------------+--------+--------+ SFA Prox  202          50-74% stenosis                 +----------+--------+-----+---------------+--------+--------+ SFA Mid   152          30-49% stenosis                 +----------+--------+-----+---------------+--------+--------+  Left Graft #1: mid SFA to posterior tibial +--------------------+--------+---------------+--------+----------------+                     PSV cm/sStenosis       WaveformComments         +--------------------+--------+---------------+--------+----------------+ Inflow              152                                             +--------------------+--------+---------------+--------+----------------+  Proximal Anastomosis360     >70% stenosis                           +--------------------+--------+---------------+--------+----------------+ Proximal Graft      180     50-70% stenosis        low end of range +--------------------+--------+---------------+--------+----------------+ Mid Graft           136                                              +--------------------+--------+---------------+--------+----------------+ Distal Graft        141                                             +--------------------+--------+---------------+--------+----------------+ Distal Anastomosis  183     50-70% stenosis                         +--------------------+--------+---------------+--------+----------------+ Outflow             224     50-74% stenosis                         +--------------------+--------+---------------+--------+----------------+   Summary: Left: Patent graft with >70% stenosis noted involving the proximal anastomosis. 50-70% stenosis involving the proximal graft. 50-70% stenosis involving the distal anastomosis and outflow artery. Ranges of stenosis based on velocitie, disease not well appreciated.  See table(s) above for measurements and observations. Electronically signed by Carolynn Sayers on 07/15/2023 at 1:37:15 PM.    Final    VAS [place] ABI WITH/WO TBI  Result Date: 07/15/2023  LOWER EXTREMITY DOPPLER STUDY Patient Name:  [name] [name]  Date of Exam:   07/15/2023 Medical Rec #: [id number]       Accession #:    [id number] Date of Birth: April 24, 1951       Patient Gender: M Patient Age:   89 years Exam Location:  [facility] Procedure:      VAS [place] ABI WITH/WO TBI Referring Phys: --------------------------------------------------------------------------------  Indications: Peripheral artery disease.  Vascular Interventions: 06/14/2023: Left mid SFA to posterior tibial artery                         bypass with reversed GSV. Limitations: Today's exam was limited due to hardness of skin at feet. Performing Technologist: Dorthula Matas RVS, RCS  Examination Guidelines: A complete evaluation includes at minimum, Doppler waveform signals and systolic blood pressure reading at the level of bilateral brachial, anterior tibial, and posterior tibial arteries, when vessel segments are accessible. Bilateral testing is  considered an integral part of a complete examination. Photoelectric Plethysmograph (PPG) waveforms and toe systolic pressure readings are included as required and additional duplex testing as needed. Limited examinations for reoccurring indications may be performed as noted.  ABI Findings: +--------+------------------+-----+----------+--------+ Right   Rt Pressure (mmHg)IndexWaveform  Comment  +--------+------------------+-----+----------+--------+ [id number]                                       +--------+------------------+-----+----------+--------+ PTA     91  0.64 monophasic         +--------+------------------+-----+----------+--------+ DP      97                0.68 monophasic         +--------+------------------+-----+----------+--------+ +--------+------------------+-----+----------+-------+ Left    Lt Pressure (mmHg)IndexWaveform  Comment +--------+------------------+-----+----------+-------+ [id number]                                      +--------+------------------+-----+----------+-------+ PTA     160               1.13 biphasic          +--------+------------------+-----+----------+-------+ DP      101               0.71 monophasic        +--------+------------------+-----+----------+-------+ +-------+-----------+-----------+------------+------------+ ABI/TBIToday's ABIToday's TBIPrevious ABIPrevious TBI +-------+-----------+-----------+------------+------------+ Right  0.68       absent                              +-------+-----------+-----------+------------+------------+ Left   1.13       bandage                             +-------+-----------+-----------+------------+------------+  No previous ABIs to compare.  Summary: Right: Resting right ankle-brachial index indicates moderate right lower extremity arterial disease. Left: Resting left ankle-brachial index is within normal range. *See table(s) above for  measurements and observations.  Electronically signed by Carolynn Sayers on 07/15/2023 at 1:37:06 PM.    Final     Micro Results     No results found for this or any previous visit (from the past 240 hour(s)).  Today   Subjective    [name] [name] today has no headache,no chest abdominal pain,no new weakness tingling or numbness, feels much better wants to go home today.     Objective   Blood pressure 123/64, pulse 73, temperature (!) 97.1 F (36.2 C), temperature source Temporal, resp. rate 17, height 5\' 9"  (1.753 m), weight 93 kg, SpO2 98%.   Intake/Output Summary (Last 24 hours) at 08/09/2023 0932 Last data filed at 08/09/2023 7846 Gross per 24 hour  Intake 1200 ml  Output 340 ml  Net 860 ml    Exam  Awake Alert, No new F.N deficits,    [place].AT,PERRAL Supple Neck,   Symmetrical Chest wall movement, Good air movement bilaterally, CTAB RRR,No Gallops,   +ve B.Sounds, Abd Soft, Non tender,  No Cyanosis, Clubbing or edema    Data Review   Recent Labs  Lab 08/06/23 1628 08/07/23 0519 08/07/23 2251 08/08/23 0422 08/09/23 0456  WBC  --  8.7 9.5 11.1* 10.3  HGB 4.9* 6.7* 9.1* 9.3* 10.1*  HCT 15.4* 20.3* 27.0* 27.4* 30.8*  PLT  --  355 357 375 404*  MCV  --  82.9 82.3 83.8 84.6  MCH  --  27.3 27.7 28.4 27.7  MCHC  --  33.0 33.7 33.9 32.8  RDW  --  15.3 15.1 15.3 16.0*  LYMPHSABS  --   --   --  2.4 1.9  MONOABS  --   --   --  1.1* 0.9  EOSABS  --   --   --  0.4 0.4  BASOSABS  --   --   --  0.1  0.1    Recent Labs  Lab 08/07/23 0519 08/08/23 0422 08/09/23 0456  NA 140 139 141  K 4.0 3.4* 3.5  CL 110 108 110  CO2 23 25 23   ANIONGAP 7 6 8   GLUCOSE 99 83 100*  BUN 6* 5* 5*  CREATININE 0.79 0.74 0.73  AST 13*  --   --   ALT 13  --   --   ALKPHOS 97  --   --   BILITOT 1.9*  --   --   ALBUMIN 3.0*  --   --   INR 1.1  --   --   BNP  --  110.2* 41.0  MG 2.2 2.2 2.2  CALCIUM 8.8* 8.6* 9.0    Total Time in preparing paper work, data evaluation and todays exam  - 35 minutes  Signature  -    Susa Raring M.D on 08/09/2023 at 9:32 AM   -  To page go to [email or web address]

## 2023-08-09 NOTE — Transfer of Care (Signed)
Immediate Anesthesia Transfer of Care Note  Patient: [name] [name]  Procedure(s) Performed: COLONOSCOPY WITH PROPOFOL HOT HEMOSTASIS (ARGON PLASMA COAGULATION/BICAP)  Patient Location: PACU  Anesthesia Type:MAC  Level of Consciousness: awake, alert , and oriented  Airway & Oxygen Therapy: Patient Spontanous Breathing and Patient connected to face mask oxygen  Post-op Assessment: Report given to RN and Post -op Vital signs reviewed and stable  Post vital signs: Reviewed and stable  Last Vitals:  Vitals Value Taken Time  BP 126/59 08/09/23 0830  Temp 36.2 C 08/09/23 0826  Pulse 73 08/09/23 0832  Resp 16 08/09/23 0832  SpO2 99 % 08/09/23 0832  Vitals shown include unfiled device data.  Last Pain:  Vitals:   08/09/23 0830  TempSrc:   PainSc: 0-No pain      Patients Stated Pain Goal: 0 (08/07/23 0500)  Complications: No notable events documented.

## 2023-08-09 NOTE — Anesthesia Postprocedure Evaluation (Signed)
Anesthesia Post Note  Patient: [name] [name]  Procedure(s) Performed: COLONOSCOPY WITH PROPOFOL HOT HEMOSTASIS (ARGON PLASMA COAGULATION/BICAP)     Patient location during evaluation: PACU Anesthesia Type: MAC Level of consciousness: awake and alert, patient cooperative and oriented Pain management: pain level controlled Vital Signs Assessment: post-procedure vital signs reviewed and stable Respiratory status: spontaneous breathing, nonlabored ventilation, respiratory function stable and patient connected to nasal cannula oxygen Cardiovascular status: blood pressure returned to baseline and stable Postop Assessment: no apparent nausea or vomiting Anesthetic complications: no   No notable events documented.  Last Vitals:  Vitals:   08/09/23 0836 08/09/23 0840  BP: 128/62 123/64  Pulse: 77 73  Resp: 17 17  Temp:    SpO2: 95% 98%    Last Pain:  Vitals:   08/09/23 0900  TempSrc:   PainSc: 0-No pain                 Gavriel Holzhauer,E. Siyah Mault

## 2023-08-09 NOTE — Progress Notes (Signed)
Delivered patients medication from pharmacy to patient in room.

## 2023-08-09 NOTE — Plan of Care (Signed)

## 2023-08-10 NOTE — Progress Notes (Signed)
Hi Beth, please let the patient know that his esophageal biopsies came back with inflammation due to a yeast infection called candida. I would recommend that he take fluconazole 400 mg QD x 1 dose, followed by 200 mg every day for 13 days. I am still waiting on the rest of his biopsy results.

## 2023-08-11 ENCOUNTER — Other Ambulatory Visit: Payer: Self-pay

## 2023-08-11 ENCOUNTER — Encounter (HOSPITAL_BASED_OUTPATIENT_CLINIC_OR_DEPARTMENT_OTHER): Payer: Medicare HMO | Admitting: Internal Medicine

## 2023-08-11 ENCOUNTER — Encounter: Payer: Self-pay | Admitting: Internal Medicine

## 2023-08-11 ENCOUNTER — Telehealth: Payer: Self-pay

## 2023-08-11 DIAGNOSIS — Z9181 History of falling: Secondary | ICD-10-CM | POA: Diagnosis not present

## 2023-08-11 DIAGNOSIS — K921 Melena: Secondary | ICD-10-CM | POA: Diagnosis not present

## 2023-08-11 DIAGNOSIS — D62 Acute posthemorrhagic anemia: Secondary | ICD-10-CM | POA: Diagnosis not present

## 2023-08-11 DIAGNOSIS — K5520 Angiodysplasia of colon without hemorrhage: Secondary | ICD-10-CM | POA: Diagnosis not present

## 2023-08-11 DIAGNOSIS — M48062 Spinal stenosis, lumbar region with neurogenic claudication: Secondary | ICD-10-CM | POA: Diagnosis not present

## 2023-08-11 DIAGNOSIS — K922 Gastrointestinal hemorrhage, unspecified: Secondary | ICD-10-CM

## 2023-08-11 DIAGNOSIS — I10 Essential (primary) hypertension: Secondary | ICD-10-CM | POA: Diagnosis not present

## 2023-08-11 DIAGNOSIS — E1151 Type 2 diabetes mellitus with diabetic peripheral angiopathy without gangrene: Secondary | ICD-10-CM | POA: Diagnosis not present

## 2023-08-11 DIAGNOSIS — Z7902 Long term (current) use of antithrombotics/antiplatelets: Secondary | ICD-10-CM | POA: Diagnosis not present

## 2023-08-11 DIAGNOSIS — Z89422 Acquired absence of other left toe(s): Secondary | ICD-10-CM | POA: Diagnosis not present

## 2023-08-11 DIAGNOSIS — Z7982 Long term (current) use of aspirin: Secondary | ICD-10-CM | POA: Diagnosis not present

## 2023-08-11 DIAGNOSIS — Z4781 Encounter for orthopedic aftercare following surgical amputation: Secondary | ICD-10-CM | POA: Diagnosis not present

## 2023-08-11 DIAGNOSIS — I70222 Atherosclerosis of native arteries of extremities with rest pain, left leg: Secondary | ICD-10-CM | POA: Diagnosis not present

## 2023-08-11 DIAGNOSIS — Z4801 Encounter for change or removal of surgical wound dressing: Secondary | ICD-10-CM | POA: Diagnosis not present

## 2023-08-11 DIAGNOSIS — Z720 Tobacco use: Secondary | ICD-10-CM | POA: Diagnosis not present

## 2023-08-11 LAB — SURGICAL PATHOLOGY

## 2023-08-11 NOTE — Telephone Encounter (Signed)
-----   Message from Imogene Burn sent at 08/09/2023  1:44 PM EST ----- Regarding: RE: patient follow up Just Hb should be fine thanks ----- Message ----- From: Evalee Jefferson, LPN Sent: 16/09/958   1:02 PM EST To: Imogene Burn, MD Subject: RE: patient follow up                          On the labs, do you want a full CBC or just a Hgb? ----- Message ----- From: Imogene Burn, MD Sent: 08/09/2023   8:55 AM EST To: Evalee Jefferson, LPN; # Subject: RE: patient follow up                          Sounds good. Beth, let's also arrange for GI clinic follow up with me or APP in 1-2 months for colon polyps. ----- Message ----- From: Benancio Deeds, MD Sent: 08/09/2023   8:41 AM EST To: Evalee Jefferson, LPN; Imogene Burn, MD Subject: patient follow up                              [name] - did a colonoscopy for this patient - cecal AVM ablated which could be cause of his anemia. Should be going home later today - am holding Plavix a few more days as he had a pretty significant anemia. Will need Hgb at end of week to make sure stable. If he rebleeds / worsening anemia, will need capsule. He had multiple small polyps I did not remove today in light of his Plavix and did not want to confound his presentation should he rebleeding. He will need a repeat colonoscopy in a few months, can do as outpatient. I think he would be yours if unassigned here? Let me know if otherwise.   Beth - can you coordinate Hgb for this patient to be done on Friday or so? Thanks  Brett Canales

## 2023-08-11 NOTE — Telephone Encounter (Signed)
Called the patient on the mobile number. Voicemail is full and cannot accept any messages. Called the patient's home number. Phone rings then disconnects. Letter mailed to the patient.

## 2023-08-12 ENCOUNTER — Other Ambulatory Visit: Payer: Self-pay

## 2023-08-12 MED ORDER — FLUCONAZOLE 200 MG PO TABS: ORAL_TABLET | ORAL | 0 refills | Status: DC

## 2023-08-15 ENCOUNTER — Encounter (HOSPITAL_COMMUNITY): Payer: Self-pay | Admitting: Gastroenterology

## 2023-08-15 DIAGNOSIS — I10 Essential (primary) hypertension: Secondary | ICD-10-CM | POA: Diagnosis not present

## 2023-08-15 DIAGNOSIS — Z4801 Encounter for change or removal of surgical wound dressing: Secondary | ICD-10-CM | POA: Diagnosis not present

## 2023-08-15 DIAGNOSIS — M48062 Spinal stenosis, lumbar region with neurogenic claudication: Secondary | ICD-10-CM | POA: Diagnosis not present

## 2023-08-15 DIAGNOSIS — Z4781 Encounter for orthopedic aftercare following surgical amputation: Secondary | ICD-10-CM | POA: Diagnosis not present

## 2023-08-15 DIAGNOSIS — D62 Acute posthemorrhagic anemia: Secondary | ICD-10-CM | POA: Diagnosis not present

## 2023-08-15 DIAGNOSIS — E1151 Type 2 diabetes mellitus with diabetic peripheral angiopathy without gangrene: Secondary | ICD-10-CM | POA: Diagnosis not present

## 2023-08-15 DIAGNOSIS — Z9181 History of falling: Secondary | ICD-10-CM | POA: Diagnosis not present

## 2023-08-15 DIAGNOSIS — K921 Melena: Secondary | ICD-10-CM | POA: Diagnosis not present

## 2023-08-15 DIAGNOSIS — K5520 Angiodysplasia of colon without hemorrhage: Secondary | ICD-10-CM | POA: Diagnosis not present

## 2023-08-15 DIAGNOSIS — Z89422 Acquired absence of other left toe(s): Secondary | ICD-10-CM | POA: Diagnosis not present

## 2023-08-15 DIAGNOSIS — I70222 Atherosclerosis of native arteries of extremities with rest pain, left leg: Secondary | ICD-10-CM | POA: Diagnosis not present

## 2023-08-15 DIAGNOSIS — Z7982 Long term (current) use of aspirin: Secondary | ICD-10-CM | POA: Diagnosis not present

## 2023-08-15 DIAGNOSIS — Z7902 Long term (current) use of antithrombotics/antiplatelets: Secondary | ICD-10-CM | POA: Diagnosis not present

## 2023-08-15 DIAGNOSIS — Z720 Tobacco use: Secondary | ICD-10-CM | POA: Diagnosis not present

## 2023-08-17 DIAGNOSIS — Z89422 Acquired absence of other left toe(s): Secondary | ICD-10-CM | POA: Diagnosis not present

## 2023-08-17 DIAGNOSIS — K5520 Angiodysplasia of colon without hemorrhage: Secondary | ICD-10-CM | POA: Diagnosis not present

## 2023-08-17 DIAGNOSIS — I70222 Atherosclerosis of native arteries of extremities with rest pain, left leg: Secondary | ICD-10-CM | POA: Diagnosis not present

## 2023-08-17 DIAGNOSIS — Z4801 Encounter for change or removal of surgical wound dressing: Secondary | ICD-10-CM | POA: Diagnosis not present

## 2023-08-17 DIAGNOSIS — Z9181 History of falling: Secondary | ICD-10-CM | POA: Diagnosis not present

## 2023-08-17 DIAGNOSIS — I10 Essential (primary) hypertension: Secondary | ICD-10-CM | POA: Diagnosis not present

## 2023-08-17 DIAGNOSIS — Z4781 Encounter for orthopedic aftercare following surgical amputation: Secondary | ICD-10-CM | POA: Diagnosis not present

## 2023-08-17 DIAGNOSIS — Z7902 Long term (current) use of antithrombotics/antiplatelets: Secondary | ICD-10-CM | POA: Diagnosis not present

## 2023-08-17 DIAGNOSIS — D62 Acute posthemorrhagic anemia: Secondary | ICD-10-CM | POA: Diagnosis not present

## 2023-08-17 DIAGNOSIS — Z7982 Long term (current) use of aspirin: Secondary | ICD-10-CM | POA: Diagnosis not present

## 2023-08-17 DIAGNOSIS — K921 Melena: Secondary | ICD-10-CM | POA: Diagnosis not present

## 2023-08-17 DIAGNOSIS — E1151 Type 2 diabetes mellitus with diabetic peripheral angiopathy without gangrene: Secondary | ICD-10-CM | POA: Diagnosis not present

## 2023-08-17 DIAGNOSIS — M48062 Spinal stenosis, lumbar region with neurogenic claudication: Secondary | ICD-10-CM | POA: Diagnosis not present

## 2023-08-17 DIAGNOSIS — Z720 Tobacco use: Secondary | ICD-10-CM | POA: Diagnosis not present

## 2023-08-18 ENCOUNTER — Encounter (HOSPITAL_BASED_OUTPATIENT_CLINIC_OR_DEPARTMENT_OTHER): Payer: Medicare HMO | Attending: Internal Medicine | Admitting: Internal Medicine

## 2023-08-18 DIAGNOSIS — I70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: Secondary | ICD-10-CM | POA: Diagnosis not present

## 2023-08-18 DIAGNOSIS — D649 Anemia, unspecified: Secondary | ICD-10-CM | POA: Insufficient documentation

## 2023-08-18 DIAGNOSIS — L97523 Non-pressure chronic ulcer of other part of left foot with necrosis of muscle: Secondary | ICD-10-CM | POA: Insufficient documentation

## 2023-08-18 DIAGNOSIS — L97526 Non-pressure chronic ulcer of other part of left foot with bone involvement without evidence of necrosis: Secondary | ICD-10-CM | POA: Diagnosis not present

## 2023-08-18 DIAGNOSIS — E11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: Secondary | ICD-10-CM | POA: Insufficient documentation

## 2023-08-18 DIAGNOSIS — L97522 Non-pressure chronic ulcer of other part of left foot with fat layer exposed: Secondary | ICD-10-CM | POA: Diagnosis not present

## 2023-08-18 DIAGNOSIS — I10 Essential (primary) hypertension: Secondary | ICD-10-CM | POA: Insufficient documentation

## 2023-08-18 DIAGNOSIS — E11621 Type 2 diabetes mellitus with foot ulcer: Secondary | ICD-10-CM | POA: Insufficient documentation

## 2023-08-19 DIAGNOSIS — D62 Acute posthemorrhagic anemia: Secondary | ICD-10-CM | POA: Diagnosis not present

## 2023-08-19 DIAGNOSIS — E1151 Type 2 diabetes mellitus with diabetic peripheral angiopathy without gangrene: Secondary | ICD-10-CM | POA: Diagnosis not present

## 2023-08-19 DIAGNOSIS — Z720 Tobacco use: Secondary | ICD-10-CM | POA: Diagnosis not present

## 2023-08-19 DIAGNOSIS — I10 Essential (primary) hypertension: Secondary | ICD-10-CM | POA: Diagnosis not present

## 2023-08-19 DIAGNOSIS — Z89422 Acquired absence of other left toe(s): Secondary | ICD-10-CM | POA: Diagnosis not present

## 2023-08-19 DIAGNOSIS — K5520 Angiodysplasia of colon without hemorrhage: Secondary | ICD-10-CM | POA: Diagnosis not present

## 2023-08-19 DIAGNOSIS — I70222 Atherosclerosis of native arteries of extremities with rest pain, left leg: Secondary | ICD-10-CM | POA: Diagnosis not present

## 2023-08-19 DIAGNOSIS — Z9181 History of falling: Secondary | ICD-10-CM | POA: Diagnosis not present

## 2023-08-19 DIAGNOSIS — Z7982 Long term (current) use of aspirin: Secondary | ICD-10-CM | POA: Diagnosis not present

## 2023-08-19 DIAGNOSIS — Z4781 Encounter for orthopedic aftercare following surgical amputation: Secondary | ICD-10-CM | POA: Diagnosis not present

## 2023-08-19 DIAGNOSIS — Z4801 Encounter for change or removal of surgical wound dressing: Secondary | ICD-10-CM | POA: Diagnosis not present

## 2023-08-19 DIAGNOSIS — M48062 Spinal stenosis, lumbar region with neurogenic claudication: Secondary | ICD-10-CM | POA: Diagnosis not present

## 2023-08-19 DIAGNOSIS — K921 Melena: Secondary | ICD-10-CM | POA: Diagnosis not present

## 2023-08-19 DIAGNOSIS — Z7902 Long term (current) use of antithrombotics/antiplatelets: Secondary | ICD-10-CM | POA: Diagnosis not present

## 2023-08-19 NOTE — Progress Notes (Signed)
Gastineau, Lydell A ([id number]) [phone].pdf Page 1 of 12 Visit Report for 08/18/2023 Debridement Details Patient Name: Date of Service: CAIDENCE, FENELON 08/18/2023 1:45 PM Medical Record Number: [id number] Patient Account Number: [id number] Date of Birth/Sex: Treating RN: April 19, 1951 (72 y.o. M) Primary Care Provider: Doreen Beam Other Clinician: Referring Provider: Treating Provider/Extender: Anne Ng Weeks in Treatment: 3 Debridement Performed for Assessment: Wound #1 Left,Dorsal Foot Performed By: Physician Maxwell Caul., MD The following information was scribed by: Karie Schwalbe The information was scribed for: Baltazar Najjar Debridement Type: Debridement Severity of Tissue Pre Debridement: Fat layer exposed Level of Consciousness (Pre-procedure): Awake and Alert Pre-procedure Verification/Time Out Yes - 15:35 Taken: Start Time: 15:35 Pain Control: Lidocaine 4% T opical Solution Percent of Wound Bed Debrided: 100% T Area Debrided (cm): otal 0.2 Tissue and other material debrided: Non-Viable, Slough, Subcutaneous, Slough Level: Skin/Subcutaneous Tissue Debridement Description: Excisional Instrument: Curette Bleeding: Minimum Hemostasis Achieved: Pressure Response to Treatment: Procedure was tolerated well Level of Consciousness (Post- Awake and Alert procedure): Post Debridement Measurements of Total Wound Length: (cm) 0.5 Width: (cm) 0.5 Depth: (cm) 0.3 Volume: (cm) 0.059 Character of Wound/Ulcer Post Debridement: Improved Severity of Tissue Post Debridement: Fat layer exposed Post Procedure Diagnosis Same as Pre-procedure Electronic Signature(s) Signed: 08/18/2023 5:17:16 PM By: Baltazar Najjar MD Entered By: Baltazar Najjar on 08/18/2023 16:47:24 -------------------------------------------------------------------------------- Debridement Details Patient Name: Date of Service: [name], Saburo A. 08/18/2023 1:45  PM Medical Record Number: [id number] Patient Account Number: [id number] Date of Birth/Sex: Treating RN: 05-Jul-1951 (72 y.o. M) Primary Care Provider: Doreen Beam Other Clinician: Referring Provider: Treating Provider/Extender: Anne Ng Weeks in Treatment: 3 Debridement Performed for Assessment: Wound #2 Left Amputation Site - T oe Performed By: Physician Maxwell Caul., MD The following information was scribed by: Karie Schwalbe Fray, Harjas A ([id number]) [id number] Page 2 of 12 The information was scribed for: Baltazar Najjar Debridement Type: Debridement Severity of Tissue Pre Debridement: Fat layer exposed Level of Consciousness (Pre-procedure): Awake and Alert Pre-procedure Verification/Time Out Yes - 15:35 Taken: Start Time: 15:35 Pain Control: Lidocaine 4% T opical Solution Percent of Wound Bed Debrided: 100% T Area Debrided (cm): otal 15.31 Tissue and other material debrided: Non-Viable, Slough, Subcutaneous, Slough Level: Skin/Subcutaneous Tissue Debridement Description: Excisional Instrument: Curette Bleeding: Minimum Hemostasis Achieved: Pressure Response to Treatment: Procedure was tolerated well Level of Consciousness (Post- Awake and Alert procedure): Post Debridement Measurements of Total Wound Length: (cm) 6.5 Width: (cm) 3 Depth: (cm) 0.8 Volume: (cm) 12.252 Character of Wound/Ulcer Post Debridement: Improved Severity of Tissue Post Debridement: Fat layer exposed Post Procedure Diagnosis Same as Pre-procedure Electronic Signature(s) Signed: 08/18/2023 5:17:16 PM By: Baltazar Najjar MD Entered By: Baltazar Najjar on 08/18/2023 16:47:33 -------------------------------------------------------------------------------- Debridement Details Patient Name: Date of Service: [name], Kylor A. 08/18/2023 1:45 PM Medical Record Number: [id number] Patient Account Number: [id number] Date of Birth/Sex: Treating  RN: 11-20-1950 (71 y.o. M) Primary Care Provider: Doreen Beam Other Clinician: Referring Provider: Treating Provider/Extender: Anne Ng Weeks in Treatment: 3 Debridement Performed for Assessment: Wound #3 Left T Great oe Performed By: Physician Maxwell Caul., MD The following information was scribed by: Karie Schwalbe The information was scribed for: Baltazar Najjar Debridement Type: Debridement Severity of Tissue Pre Debridement: Fat layer exposed Level of Consciousness (Pre-procedure): Awake and Alert Pre-procedure Verification/Time Out Yes - 15:35 Taken: Start Time: 15:35 Pain Control: Lidocaine 4% T opical Solution Percent of Wound Bed Debrided: 100% T Area Debrided (cm): otal 8.8 Tissue and other  material debrided: Non-Viable, Slough, Subcutaneous, Slough Level: Skin/Subcutaneous Tissue Debridement Description: Excisional Instrument: Curette Bleeding: Minimum Hemostasis Achieved: Pressure Response to Treatment: Procedure was tolerated well Level of Consciousness (Post- Awake and Alert procedure): Post Debridement Measurements of Total Wound Dombeck, Napolean A ([id number]) [id number] Page 3 of 12 Length: (cm) 5.9 Width: (cm) 1.9 Depth: (cm) 0.3 Volume: (cm) 2.641 Character of Wound/Ulcer Post Debridement: Improved Severity of Tissue Post Debridement: Fat layer exposed Post Procedure Diagnosis Same as Pre-procedure Electronic Signature(s) Signed: 08/18/2023 5:17:16 PM By: Baltazar Najjar MD Entered By: Baltazar Najjar on 08/18/2023 16:47:42 -------------------------------------------------------------------------------- HPI Details Patient Name: Date of Service: [name], Corinthian A. 08/18/2023 1:45 PM Medical Record Number: [id number] Patient Account Number: [id number] Date of Birth/Sex: Treating RN: 02-Jul-1951 (72 y.o. M) Primary Care Provider: Doreen Beam Other Clinician: Referring Provider: Treating  Provider/Extender: Minette Brine, Herminio Commons Weeks in Treatment: 3 History of Present Illness HPI Description: ADMISSION 07/22/2023 ***POST-REVASCULARIZATION ABI/DOPPLER:*** ABI: +--------+------------------+-----+----------+--------+ Right Rt Pressure (mmHg)IndexWaveform Comment  +--------+------------------+-----+----------+--------+ [id number]     +--------+------------------+-----+----------+--------+ PTA 91 0.64 monophasic  +--------+------------------+-----+----------+--------+ DP 97 0.68 monophasic  +--------+------------------+-----+----------+--------+ +--------+------------------+-----+----------+-------+ Left Lt Pressure (mmHg)IndexWaveform Comment +--------+------------------+-----+----------+-------+ [id number]     +--------+------------------+-----+----------+-------+ PTA 160 1.13 biphasic   +--------+------------------+-----+----------+-------+ DP 101 0.71 monophasic  +--------+------------------+-----+----------+-------+ LLE Duplex: +----------+--------+-----+---------------+--------+--------+ LEFT PSV cm/sRatioStenosis WaveformComments +----------+--------+-----+---------------+--------+--------+ CFA Distal204  50-74% stenosis   +----------+--------+-----+---------------+--------+--------+ SFA Prox 202  50-74% stenosis   +----------+--------+-----+---------------+--------+--------+ SFA Mid 152  30-49% stenosis   +----------+--------+-----+---------------+--------+--------+ Left Graft #1: mid SFA to posterior tibial +--------------------+--------+---------------+--------+----------------+  PSV cm/sStenosis WaveformComments  +--------------------+--------+---------------+--------+----------------+ Inflow 152     +--------------------+--------+---------------+--------+----------------+ Proximal Anastomosis360 >70% stenosis     +--------------------+--------+---------------+--------+----------------+ Proximal Graft 180 50-70% stenosis low end of range +--------------------+--------+---------------+--------+----------------+ Mid Graft 136     Cremer, Jakobee A ([id number]) [id number] Page 4 of 12 +--------------------+--------+---------------+--------+----------------+ Distal Graft 141     +--------------------+--------+---------------+--------+----------------+ Distal Anastomosis 183 50-70% stenosis   +--------------------+--------+---------------+--------+----------------+ Outflow 224 50-74% stenosis   +--------------------+--------+---------------+--------+----------------+ This is a 72 year old type II diabetic (last hemoglobin A1c 6.7% on June 07, 2023) with severe peripheral vascular occlusive disease. 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 Preoperative MRI did not demonstrate evidence of osteomyelitis. Unfortunately, ABIs were not performed prior to that operation and postoperatively, ABIs were done that showed severe bilateral lower extremity arterial occlusive disease with minimal perfusion to the left lower leg. He was transferred to the vascular surgery service at [facility]. An arteriogram was done that showed occlusion of the left common iliac artery. This was stented, but additional inflow was necessary to provide better blood flow to the foot. He subsequently underwent a femoral to tibial artery bypass with saphenous vein graft on October 8. 1 month post bypass arterial studies are copied above. He continued to receive wound care at [facility]. His last visit there was on 12 November. Their note describes the base of the ray amputation appearing viable with buds of granulation appearing. The surrounding digits appeared to be marginal with ischemic eschar formation. They have  been doing Dakin's wet-to-dry dressings. He was referred to our center to be evaluated for hyperbaric oxygen therapy. 07/29/2023: The dorsal foot wound is essentially unchanged. I can no longer probe to bone at the amputation site. Tendon is exposed on the dorsal aspect of the great toe, but the black eschar has not reaccumulated. Santyl was cost prohibitive so they have been using Medihoney. We are working on getting him approved for hyperbaric oxygen therapy. 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 This was in October 2024. He was referred down to our center for nonhealing wounds to consider hyperbaric oxygen. Since the patient was last here on 11/22 he was admitted to hospital from 08/06/2019 4 through 12/3 to/24 he was found to have a GI bleed. His hemoglobin was slightly over 4 on presentation he was transfused. 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 Electronic Signature(s) Signed: 08/18/2023 5:17:16 PM By: Baltazar Najjar MD Entered By: Baltazar Najjar on 08/18/2023 16:50:28 -------------------------------------------------------------------------------- Physical Exam Details Patient Name: Date of Service: [name], Jevante A. 08/18/2023 1:45 PM Medical Record Number: [id number] Patient Account Number: [id number] Date of Birth/Sex: Treating RN: 09/30/1950 (72 y.o. M) Primary Care Provider: Doreen Beam Other Clinician: Referring Provider: Treating Provider/Extender: Minette Brine, Dhruv Weeks in Treatment: 3 Notes Wound exam; there is substantial wound on the dorsal foot from the medial part of the third toe to the dorsal and lateral part of the first toe also in the webspace which was part of the second ray amputation. There is  exposed tendon on the left dorsal toe. No exposed bone today. No gangrenous black eschar Electronic Signature(s) Signed: 08/18/2023 5:17:16 PM By: Baltazar Najjar MD Entered By: Baltazar Najjar on 08/18/2023 16:54:31 -------------------------------------------------------------------------------- Physician Orders Details Patient Name: Date of Service: [name], Orestes A. 08/18/2023 1:45 PM Medical Record Number: [id number] Patient Account Number: [id number] Date of Birth/Sex: Treating RN: 09/28/1950 (72 y.o. [name] Primary Care Provider: Doreen Beam Other Clinician: Referring Provider: Treating Provider/Extender: Anne Ng Weeks in Treatment: 3 Verbal / Phone Orders: No Diagnosis Coding Sievert, Deeric A ([id number]) [phone].pdf Page 5 of 12 Follow-up Appointments ppointment in 2 weeks. - Dr. Lady Gary Return A Return appointment in 1 month. - Dr. Lady Gary Anesthetic (In clinic) Topical Lidocaine 4% applied to wound bed Bathing/ Shower/ Hygiene May shower and wash wound with soap and water. Edema Control - Orders / Instructions Elevate legs to the level of the heart or above for 30 minutes daily and/or when sitting for 3-4 times a day throughout the day. Avoid standing for long periods of time. Additional Orders / Instructions Follow Nutritious Diet - 70-100 grams of protein a day - can supplement with protein shakes ensure/premier vitamin C start 500 mg once a day and if tolerated then take twice a day zinc 30-50 mg once a day Home Health No change in wound care orders this week; continue Home Health for wound care. May utilize formulary equivalent dressing for wound treatment orders unless otherwise specified. Dressing changes to be completed by Home Health on Monday / Wednesday / Friday except when patient has scheduled visit at [facility] Care Center. - ONLY USE MEDIHONEY IF SANYL IS NOT AVAILABLE Other Home Health  Orders/Instructions: - [facility] Hyperbaric Oxygen Therapy Evaluate for HBO Therapy Indication: - Wagner 4 diabetic foot ulcer If appropriate for treatment, begin HBOT per protocol: 2.5 ATA for 90 Minutes with 2 Five (5) Minute A Breaks ir Total Number of Treatments: - 40 One treatments per day (delivered Monday through Friday unless otherwise specified in Special Instructions below): Finger stick Blood Glucose Pre- and Post- HBOT Treatment. Follow Hyperbaric Oxygen Glycemia Protocol Give two 4oz orange juices in addition to Glucerna when the glycemic protocol is used. A frin (Oxymetazoline HCL) 0.05% nasal spray - 1  spray in both nostrils daily as needed prior to HBO treatment for difficulty clearing ears Wound Treatment Wound #1 - Foot Wound Laterality: Dorsal, Left Cleanser: Soap and Water 1 x Per Day/30 Days Discharge Instructions: May shower and wash wound with dial antibacterial soap and water prior to dressing change. Cleanser: Wound Cleanser 1 x Per Day/30 Days Discharge Instructions: Cleanse the wound with wound cleanser prior to applying a clean dressing using gauze sponges, not tissue or cotton balls. Prim Dressing: MediHoney Gel, tube 1.5 (oz) 1 x Per Day/30 Days ary Discharge Instructions: IF SANTYL NOT AVAILABLE - Apply to wound bed as instructed Prim Dressing: Santyl Ointment 1 x Per Day/30 Days ary Discharge Instructions: Apply nickel thick amount to wound bed as instructed Secured With: Elastic Bandage 4 inch (ACE bandage) 1 x Per Day/30 Days Discharge Instructions: Secure with ACE bandage as directed. Secured With: [employer], 4.5x3.1 (in/yd) 1 x Per Day/30 Days Discharge Instructions: Secure with Kerlix as directed. Secured With: Transpore Surgical Tape, 2x10 (in/yd) 1 x Per Day/30 Days Discharge Instructions: Secure dressing with tape as directed. Wound #2 - Amputation Site - Toe Wound Laterality: Left Cleanser: Soap and Water 1 x Per Day/30 Days Discharge  Instructions: May shower and wash wound with dial antibacterial soap and water prior to dressing change. Cleanser: Wound Cleanser 1 x Per Day/30 Days Discharge Instructions: Cleanse the wound with wound cleanser prior to applying a clean dressing using gauze sponges, not tissue or cotton balls. Prim Dressing: MediHoney Gel, tube 1.5 (oz) 1 x Per Day/30 Days ary Discharge Instructions: IF SANTYL NOT AVAILABLE - Apply to wound bed as instructed Prim Dressing: Santyl Ointment 1 x Per Day/30 Days ary Discharge Instructions: Apply nickel thick amount to wound bed as instructed Secondary Dressing: ABD Pad, 5x9 1 x Per Day/30 Days Discharge Instructions: Apply over primary dressing as directed. Secondary Dressing: Woven Gauze Sponge, Non-Sterile 4x4 in 1 x Per Day/30 Days Discharge Instructions: Moisten gauze with saline and apply over primary dressing as directed. Bethel, Germain A ([id number]) [phone].pdf Page 6 of 12 Secured With: Elastic Bandage 4 inch (ACE bandage) 1 x Per Day/30 Days Discharge Instructions: Secure with ACE bandage as directed. Secured With: [employer], 4.5x3.1 (in/yd) 1 x Per Day/30 Days Discharge Instructions: Secure with Kerlix as directed. Secured With: Transpore Surgical Tape, 2x10 (in/yd) 1 x Per Day/30 Days Discharge Instructions: Secure dressing with tape as directed. Wound #3 - T Great oe Wound Laterality: Left Cleanser: Soap and Water 1 x Per Day/30 Days Discharge Instructions: May shower and wash wound with dial antibacterial soap and water prior to dressing change. Cleanser: Wound Cleanser 1 x Per Day/30 Days Discharge Instructions: Cleanse the wound with wound cleanser prior to applying a clean dressing using gauze sponges, not tissue or cotton balls. Prim Dressing: MediHoney Gel, tube 1.5 (oz) 1 x Per Day/30 Days ary Discharge Instructions: IF SANTYL NOT AVAILABLE - Apply to wound bed as instructed Prim Dressing: Santyl  Ointment 1 x Per Day/30 Days ary Discharge Instructions: Apply nickel thick amount to wound bed as instructed Secondary Dressing: ABD Pad, 5x9 1 x Per Day/30 Days Discharge Instructions: Apply over primary dressing as directed. Secondary Dressing: Woven Gauze Sponge, Non-Sterile 4x4 in 1 x Per Day/30 Days Discharge Instructions: Moisten gauze with saline and apply over primary dressing as directed. Secured With: Elastic Bandage 4 inch (ACE bandage) 1 x Per Day/30 Days Discharge Instructions: Secure with ACE bandage as directed. Secured With: [employer], 4.5x3.1 (in/yd)  1 x Per Day/30 Days Discharge Instructions: Secure with Kerlix as directed. Secured With: Transpore Surgical Tape, 2x10 (in/yd) 1 x Per Day/30 Days Discharge Instructions: Secure dressing with tape as directed. GLYCEMIA INTERVENTIONS PROTOCOL PRE-HBO GLYCEMIA INTERVENTIONS ACTION INTERVENTION Obtain pre-HBO capillary blood glucose (ensure 1 physician order is in chart). A. Notify HBO physician and await physician orders. 2 If result is 70 mg/dl or below: B. If the result meets the hospital definition of a critical result, follow hospital policy. A. Give patient an 8 ounce Glucerna Shake, an 8 ounce Ensure, or 8 ounces of a Glucerna/Ensure equivalent dietary supplement*. B. Wait 30 minutes. If result is 71 mg/dl to 161 mg/dl: C. Retest patients capillary blood glucose (CBG). D. If result greater than or equal to 110 mg/dl, proceed with HBO. If result less than 110 mg/dl, notify HBO physician and consider holding HBO. If result is 131 mg/dl to 096 mg/dl: A. Proceed with HBO. A. Notify HBO physician and await physician orders. B. It is recommended to hold HBO and do If result is 250 mg/dl or greater: blood/urine ketone testing. C. If the result meets the hospital definition of a critical result, follow hospital policy. POST-HBO GLYCEMIA INTERVENTIONS ACTION INTERVENTION Obtain post HBO capillary blood  glucose (ensure 1 physician order is in chart). A. Notify HBO physician and await physician orders. 2 If result is 70 mg/dl or below: B. If the result meets the hospital definition of a critical result, follow hospital policy. A. Give patient an 8 ounce Glucerna Shake, an 8 ounce Ensure, or 8 ounces of a Glucerna/Ensure equivalent dietary supplement*. B. Wait 15 minutes for symptoms of If result is 71 mg/dl to 045 mg/dl: hypoglycemia (i.e. nervousness, anxiety, sweating, chills, clamminess, irritability, confusion, tachycardia or dizziness). C. If patient asymptomatic, discharge patient. General, Ariez A ([id number]) [phone].pdf Page 7 of 12 If patient symptomatic, repeat capillary blood glucose (CBG) and notify HBO physician. If result is 101 mg/dl to 027 mg/dl: A. Discharge patient. A. Notify HBO physician and await physician orders. B. It is recommended to do blood/urine ketone If result is 250 mg/dl or greater: testing. C. If the result meets the hospital definition of a critical result, follow hospital policy. *Juice or candies are NOT equivalent products. If patient refuses the Glucerna or Ensure, please consult the hospital dietitian for an appropriate substitute. Electronic Signature(s) Signed: 08/18/2023 5:17:16 PM By: Baltazar Najjar MD Signed: 08/18/2023 6:01:39 PM By: Karie Schwalbe RN Entered By: Karie Schwalbe on 08/18/2023 15:40:09 -------------------------------------------------------------------------------- Problem List Details Patient Name: Date of Service: [name], Edon A. 08/18/2023 1:45 PM Medical Record Number: [id number] Patient Account Number: [id number] Date of Birth/Sex: Treating RN: 12-15-1950 (72 y.o. 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 07/22/2023 No Yes without evidence of necrosis L97.523 Non-pressure chronic ulcer of other part of left foot with necrosis of muscle 07/22/2023 No Yes L97.522 Non-pressure chronic ulcer of other part of left foot with fat layer exposed 07/22/2023 No Yes E11.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 07/22/2023 No Yes E11.621 Type 2 diabetes mellitus with foot ulcer 07/22/2023 No Yes I73.9 Peripheral vascular disease, unspecified 07/22/2023 No Yes I70.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 08/18/2023 1:45 PM Medical Record Number: [id number] Patient Account Number: [id number] Date of Birth/Sex: Treating RN: [birth year] (72 y.o. M) Primary Care Provider: Doreen Beam Other Clinician: Referring Provider: Treating Provider/Extender: Minette Brine, Herminio Commons Weeks in Treatment: 3 Subjective History of Present Illness (HPI) ADMISSION 07/22/2023 ***POST-REVASCULARIZATION ABI/DOPPLER:*** ABI: +--------+------------------+-----+----------+--------+ Right Rt Pressure (mmHg)IndexWaveform Comment  +--------+------------------+-----+----------+--------+ [id number]     +--------+------------------+-----+----------+--------+ PTA 91 0.64 monophasic  +--------+------------------+-----+----------+--------+ DP 97 0.68 monophasic   +--------+------------------+-----+----------+--------+ +--------+------------------+-----+----------+-------+ Left Lt Pressure (mmHg)IndexWaveform Comment +--------+------------------+-----+----------+-------+ [id number]     +--------+------------------+-----+----------+-------+ PTA 160 1.13 biphasic   +--------+------------------+-----+----------+-------+ DP 101 0.71 monophasic  +--------+------------------+-----+----------+-------+ LLE Duplex: +----------+--------+-----+---------------+--------+--------+ LEFT PSV cm/sRatioStenosis WaveformComments +----------+--------+-----+---------------+--------+--------+ CFA Distal204  50-74% stenosis   +----------+--------+-----+---------------+--------+--------+ SFA Prox 202  50-74% stenosis   +----------+--------+-----+---------------+--------+--------+ SFA Mid 152  30-49% stenosis   +----------+--------+-----+---------------+--------+--------+ Left Graft #1: mid SFA to posterior tibial +--------------------+--------+---------------+--------+----------------+  PSV cm/sStenosis WaveformComments  +--------------------+--------+---------------+--------+----------------+ Inflow 152     +--------------------+--------+---------------+--------+----------------+ Proximal Anastomosis360 >70% stenosis    +--------------------+--------+---------------+--------+----------------+ Proximal Graft 180 50-70% stenosis low end of range +--------------------+--------+---------------+--------+----------------+ Mid Graft 136     +--------------------+--------+---------------+--------+----------------+ Distal Graft 141     +--------------------+--------+---------------+--------+----------------+ Distal Anastomosis 183 50-70% stenosis   +--------------------+--------+---------------+--------+----------------+ Outflow 224 50-74% stenosis    +--------------------+--------+---------------+--------+----------------+ This is a 72 year old type II diabetic (last hemoglobin A1c 6.7% on June 07, 2023) with severe peripheral vascular occlusive disease. 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 Preoperative MRI did not demonstrate evidence of osteomyelitis. Unfortunately, ABIs were not performed prior to that operation and postoperatively, ABIs were done that showed severe bilateral lower extremity arterial occlusive disease with minimal perfusion to the left lower leg. He was transferred to the vascular surgery service at [facility]. An arteriogram was done that showed occlusion of the left common iliac artery. This was stented, but additional inflow was necessary to provide better blood flow to the foot. He subsequently underwent a femoral to tibial artery bypass with saphenous vein graft on October 8. 1 month post bypass arterial studies are copied above. He continued to receive wound care at [facility]. His last visit there was on 12 November. Their note describes the base of the ray amputation appearing viable Glassburn, Zack A ([id number]) [phone].pdf Page 9 of 12 with buds of granulation appearing. The surrounding digits appeared to be marginal with ischemic eschar formation. They have been doing Dakin's wet-to-dry dressings. He was referred to our center to be evaluated for hyperbaric oxygen therapy. 07/29/2023: The dorsal foot wound is essentially unchanged. I can no longer probe to bone at the amputation site. Tendon is exposed on the dorsal aspect of the great toe, but the black eschar has not reaccumulated. Santyl was cost prohibitive so they have been using Medihoney. We are working on getting him approved for hyperbaric oxygen therapy. 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 This was in October 2024. He was referred down to our center for nonhealing wounds to consider hyperbaric oxygen. Since the patient was last here on 11/22 he was admitted to hospital from 08/06/2019 4 through 12/3 to/24 he was found to  have a GI bleed. His hemoglobin was slightly over 4 on presentation he was transfused. 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 Objective Constitutional Vitals Time Taken: 2:11 AM, Height: 68 in, Weight: 198 lbs, BMI: 30.1, Temperature: 98.6 F, Pulse: 73 bpm, Respiratory Rate: 20 breaths/min, Blood Pressure: 168/75 mmHg. Integumentary (Hair, Skin) Wound #1 status is Open. Original cause of wound was Gradually Appeared. The date acquired was: 06/02/2023. The wound has been in treatment 3 weeks. The wound is located on the Left,Dorsal Foot. The wound measures 0.5cm length x 0.5cm width x 0.1cm depth; 0.196cm^2 area and 0.02cm^3 volume. There is Fat Layer (Subcutaneous Tissue) exposed. There is no tunneling or undermining noted. There is a medium amount of serous drainage noted. The wound margin is flat and intact. There is small (1-33%) pink granulation within the wound bed. There is a large (67-100%) amount of necrotic tissue within the wound bed including Adherent Slough. The periwound skin appearance had no abnormalities noted for texture. The periwound skin appearance had no abnormalities noted for color. The periwound skin appearance exhibited: Dry/Scaly. Periwound temperature was noted as No Abnormality. The periwound has tenderness on palpation. Wound #2 status is Open. Original cause of wound was Surgical Injury. The date acquired was: 06/02/2023. The wound has been in treatment 3 weeks. The wound is located on the Left Amputation Site - T The wound measures 6.5cm length x 3cm  width x 0.8cm depth; 15.315cm^2 area and 12.252cm^3 volume. oe. There is bone and Fat Layer (Subcutaneous Tissue) exposed. There is no tunneling or undermining noted. There is a medium amount of serosanguineous drainage noted. The wound margin is flat and intact. There is small (1-33%) red granulation within the wound bed. There is a large (67-100%) amount of necrotic tissue within the wound bed including Adherent Slough. The periwound skin appearance had no abnormalities noted for texture. The periwound skin appearance had no abnormalities noted for color. The periwound skin appearance exhibited: Dry/Scaly. Periwound temperature was noted as No Abnormality. The periwound has tenderness on palpation. Wound #3 status is Open. Original cause of wound was Gradually Appeared. The date acquired was: 06/07/2023. The wound has been in treatment 3 weeks. The wound is located on the Left T Great. The wound measures 5.9cm length x 1.9cm width x 0.2cm depth; 8.804cm^2 area and 1.761cm^3 volume. There is Fat oe Layer (Subcutaneous Tissue) exposed. There is no tunneling or undermining noted. There is a medium amount of serosanguineous drainage noted. The wound margin is flat and intact. There is medium (34-66%) red, pink granulation within the wound bed. There is a medium (34-66%) amount of necrotic tissue within the wound bed including Eschar and Adherent Slough. The periwound skin appearance had no abnormalities noted for texture. The periwound skin appearance had no abnormalities noted for color. The periwound skin appearance exhibited: Dry/Scaly. Periwound temperature was noted as No Abnormality. The periwound has tenderness on palpation. 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 There was a Excisional Skin/Subcutaneous Tissue Debridement with a total area of 0.2 sq cm performed by Maxwell Caul., MD. With the following instrument(s): Curette to remove Non-Viable tissue/material. Material removed includes Subcutaneous Tissue and Slough and after achieving pain control using Lidocaine 4% T opical Solution. No specimens were taken. A time out was conducted at 15:35, prior to the start of the procedure. A Minimum amount of bleeding was controlled with Pressure. The procedure was tolerated well. Post Debridement Measurements: 0.5cm length x 0.5cm width x 0.3cm depth; 0.059cm^3 volume. Character of Wound/Ulcer Post Debridement is improved. Severity of Tissue Post Debridement is: Fat layer exposed. Post procedure Diagnosis Wound #1: Same as Pre-Procedure Wound #2 Raya, Shakim A ([id number]) [id number] Page 10 of 12 Pre-procedure diagnosis of Wound #2 is a Diabetic Wound/Ulcer of the Lower Extremity located on the Left Amputation Site - T .Severity of Tissue Pre oe Debridement is: Fat layer exposed. There was a Excisional Skin/Subcutaneous Tissue Debridement with a total area of 15.31 sq cm performed by Maxwell Caul., MD. With the following instrument(s): Curette to remove Non-Viable tissue/material. Material removed includes Subcutaneous Tissue and Slough and after achieving pain control using Lidocaine 4% Topical Solution. No specimens were taken. A time out was conducted at 15:35,  prior to the start of the procedure. A Minimum amount of bleeding was controlled with Pressure. The procedure was tolerated well. Post Debridement Measurements: 6.5cm length x 3cm width x 0.8cm depth; 12.252cm^3 volume. Character of Wound/Ulcer Post Debridement is improved. Severity of Tissue Post Debridement is: Fat layer exposed. Post procedure Diagnosis Wound #2: Same as Pre-Procedure Wound #3 Pre-procedure diagnosis of Wound #3 is a Diabetic Wound/Ulcer of the Lower Extremity located on the Left T Great .Severity of Tissue Pre Debridement is: oe Fat layer exposed. There was a Excisional Skin/Subcutaneous Tissue Debridement with a total area of 8.8 sq cm performed by Maxwell Caul., MD. With the following instrument(s): Curette to remove Non-Viable tissue/material. Material removed includes Subcutaneous Tissue and Slough and after achieving pain control using Lidocaine 4% T opical Solution. No specimens were taken. A time out was conducted at 15:35, prior to the start of the procedure. A Minimum amount of bleeding was controlled with Pressure. The procedure was tolerated well. Post Debridement Measurements: 5.9cm length x 1.9cm width x 0.3cm depth; 2.641cm^3 volume. Character of Wound/Ulcer Post Debridement is improved. Severity of Tissue Post Debridement is: Fat layer exposed. Post procedure Diagnosis Wound #3: Same as Pre-Procedure Plan Follow-up Appointments: Return Appointment in 2 weeks. - Dr. Lady Gary Return appointment in 1 month. - Dr. Lady Gary Anesthetic: (In clinic) Topical Lidocaine 4% applied to wound bed Bathing/ Shower/ Hygiene: May shower and wash wound with soap and water. Edema Control - Orders / Instructions: Elevate legs to the level of the heart or above for 30 minutes daily and/or when sitting for 3-4 times a day throughout the day. Avoid standing for long periods of time. Additional Orders / Instructions: Follow Nutritious Diet - 70-100 grams of protein a day - can  supplement with protein shakes ensure/premier vitamin C start 500 mg once a day and if tolerated then take twice a day zinc 30-50 mg once a day Home Health: No change in wound care orders this week; continue Home Health for wound care. May utilize formulary equivalent dressing for wound treatment orders unless otherwise specified. Dressing changes to be completed by Home Health on  Monday / Wednesday / Friday except when patient has scheduled visit at Wound Care Center. - ONLY USE MEDIHONEY IF SANYL IS NOT AVAILABLE Other Home Health Orders/Instructions: - [facility] Oxygen Therapy: Evaluate for HBO Therapy Indication: - Wagner 4 diabetic foot ulcer If appropriate for treatment, begin HBOT per protocol: 2.5 ATA for 90 Minutes with 2 Five (5) Minute Air Breaks T Number of Treatments: - 40 otal One treatments per day (delivered Monday through Friday unless otherwise specified in Special Instructions below): Finger stick Blood Glucose Pre- and Post- HBOT Treatment. Follow Hyperbaric Oxygen Glycemia Protocol Give two 4oz orange juices in addition to Glucerna when the glycemic protocol is used. Afrin (Oxymetazoline HCL) 0.05% nasal spray - 1 spray in both nostrils daily as needed prior to HBO treatment for difficulty clearing ears WOUND #1: - Foot Wound Laterality: Dorsal, Left Cleanser: Soap and Water 1 x Per Day/30 Days Discharge Instructions: May shower and wash wound with dial antibacterial soap and water prior to dressing change. Cleanser: Wound Cleanser 1 x Per Day/30 Days Discharge Instructions: Cleanse the wound with wound cleanser prior to applying a clean dressing using gauze sponges, not tissue or cotton balls. Prim Dressing: MediHoney Gel, tube 1.5 (oz) 1 x Per Day/30 Days ary Discharge Instructions: IF SANTYL NOT AVAILABLE - Apply to wound bed as instructed Prim Dressing: Santyl Ointment 1 x Per Day/30 Days ary Discharge Instructions: Apply nickel thick amount to wound  bed as instructed Secured With: Elastic Bandage 4 inch (ACE bandage) 1 x Per Day/30 Days Discharge Instructions: Secure with ACE bandage as directed. Secured With: [employer], 4.5x3.1 (in/yd) 1 x Per Day/30 Days Discharge Instructions: Secure with Kerlix as directed. Secured With: Transpore Surgical T ape, 2x10 (in/yd) 1 x Per Day/30 Days Discharge Instructions: Secure dressing with tape as directed. WOUND #2: - Amputation Site - T oe Wound Laterality: Left Cleanser: Soap and Water 1 x Per Day/30 Days Discharge Instructions: May shower and wash wound with dial antibacterial soap and water prior to dressing change. Cleanser: Wound Cleanser 1 x Per Day/30 Days Discharge Instructions: Cleanse the wound with wound cleanser prior to applying a clean dressing using gauze sponges, not tissue or cotton balls. Prim Dressing: MediHoney Gel, tube 1.5 (oz) 1 x Per Day/30 Days ary Discharge Instructions: IF SANTYL NOT AVAILABLE - Apply to wound bed as instructed Prim Dressing: Santyl Ointment 1 x Per Day/30 Days ary Discharge Instructions: Apply nickel thick amount to wound bed as instructed Secondary Dressing: ABD Pad, 5x9 1 x Per Day/30 Days Discharge Instructions: Apply over primary dressing as directed. Secondary Dressing: Woven Gauze Sponge, Non-Sterile 4x4 in 1 x Per Day/30 Days Discharge Instructions: Moisten gauze with saline and apply over primary dressing as directed. Secured With: Elastic Bandage 4 inch (ACE bandage) 1 x Per Day/30 Days Discharge Instructions: Secure with ACE bandage as directed. Secured With: [employer], 4.5x3.1 (in/yd) 1 x Per Day/30 Days Discharge Instructions: Secure with Kerlix as directed. Secured With: Transpore Surgical T ape, 2x10 (in/yd) 1 x Per Day/30 Days Discharge Instructions: Secure dressing with tape as directed. Broady, Jerimy A ([id number]) [phone].pdf Page 11 of 12 WOUND #3: - T Great oe Wound Laterality:  Left Cleanser: Soap and Water 1 x Per Day/30 Days Discharge Instructions: May shower and wash wound with dial antibacterial soap and water prior to dressing change. Cleanser: Wound Cleanser 1 x Per Day/30 Days Discharge Instructions: Cleanse the wound with wound cleanser prior to applying a clean dressing using gauze sponges,  not tissue or cotton balls. Prim Dressing: MediHoney Gel, tube 1.5 (oz) 1 x Per Day/30 Days ary Discharge Instructions: IF SANTYL NOT AVAILABLE - Apply to wound bed as instructed Prim Dressing: Santyl Ointment 1 x Per Day/30 Days ary Discharge Instructions: Apply nickel thick amount to wound bed as instructed Secondary Dressing: ABD Pad, 5x9 1 x Per Day/30 Days Discharge Instructions: Apply over primary dressing as directed. Secondary Dressing: Woven Gauze Sponge, Non-Sterile 4x4 in 1 x Per Day/30 Days Discharge Instructions: Moisten gauze with saline and apply over primary dressing as directed. Secured With: Elastic Bandage 4 inch (ACE bandage) 1 x Per Day/30 Days Discharge Instructions: Secure with ACE bandage as directed. Secured With: [employer], 4.5x3.1 (in/yd) 1 x Per Day/30 Days Discharge Instructions: Secure with Kerlix as directed. Secured With: Transpore Surgical T ape, 2x10 (in/yd) 1 x Per Day/30 Days Discharge Instructions: Secure dressing with tape as directed. 1. We have been using Santyl ointment as the primary dressing although they tell me that that was unaffordable for them being over $300 they are therefore having home health apply Medihoney with gauze. 2. Comes in today with a fairly marked amount of slough on the wound surface debrided with a #5 curette he is exposed tendon on the left great toe. 3. I am okay with the Medihoney careful attention to the wound bed to see if there is progression otherwise we may need to substitute another type of dressing.We will use Santyl here Electronic Signature(s) Signed: 08/18/2023 5:17:16 PM By:  Baltazar Najjar MD Entered By: Baltazar Najjar on 08/18/2023 16:52:29 -------------------------------------------------------------------------------- SuperBill Details Patient Name: Date of Service: [name], Zelma A. 08/18/2023 Medical Record Number: [id number] Patient Account Number: [id number] Date of Birth/Sex: Treating RN: 12/10/1950 (72 y.o. M) Primary Care Provider: Doreen Beam Other Clinician: Referring Provider: Treating Provider/Extender: Minette Brine, Herminio Commons Weeks in Treatment: 3 Diagnosis Coding ICD-10 Codes Code Description [phone] Non-pressure chronic ulcer of other part of left foot with bone involvement without evidence of necrosis L97.523 Non-pressure chronic ulcer of other part of left foot with necrosis of muscle L97.522 Non-pressure chronic ulcer of other 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 E11.621 Type 2 diabetes mellitus with foot ulcer I73.9 Peripheral vascular disease, unspecified I70.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 Facility Procedures : CPT4 Code: [id number] Description: 11042 - DEB SUBQ TISSUE 20 SQ CM/< ICD-10 Diagnosis Description L97.522 Non-pressure chronic ulcer of other part of left foot with fat layer exposed Modifier: Quantity: 1 : CPT4 Code: [id number] Description: 11045 - DEB SUBQ TISS EA ADDL 20CM ICD-10 Diagnosis Description L97.522 Non-pressure chronic ulcer of other part of left foot with fat layer exposed Modifier: Quantity: 1 Physician Procedures Culbertson, Psalm A ([id number]): CPT4 Code Description [id number] 11042 - WC PHYS SUBQ TISS 20 SQ CM ICD-10 Diagnosis Description L97.522 Non-pressure chronic ulcer of other part of left foot with fat [id number] Page 12 of 12: Quantity Modifier 1 layer exposed Lepore, Jaion A ([id number]): [id number] 11045 - WC PHYS SUBQ TISS EA ADDL  20 CM ICD-10 Diagnosis Description L97.522 Non-pressure chronic ulcer of other part of left foot with fat [id number] Page 12 of 12: 1 layer exposed Electronic Signature(s) Signed: 08/18/2023 5:17:16 PM By: Baltazar Najjar MD Entered By: Baltazar Najjar on 08/18/2023 16:53:27

## 2023-08-22 DIAGNOSIS — Z89422 Acquired absence of other left toe(s): Secondary | ICD-10-CM | POA: Diagnosis not present

## 2023-08-22 DIAGNOSIS — I10 Essential (primary) hypertension: Secondary | ICD-10-CM | POA: Diagnosis not present

## 2023-08-22 DIAGNOSIS — K5520 Angiodysplasia of colon without hemorrhage: Secondary | ICD-10-CM | POA: Diagnosis not present

## 2023-08-22 DIAGNOSIS — Z4781 Encounter for orthopedic aftercare following surgical amputation: Secondary | ICD-10-CM | POA: Diagnosis not present

## 2023-08-22 DIAGNOSIS — Z7902 Long term (current) use of antithrombotics/antiplatelets: Secondary | ICD-10-CM | POA: Diagnosis not present

## 2023-08-22 DIAGNOSIS — E1151 Type 2 diabetes mellitus with diabetic peripheral angiopathy without gangrene: Secondary | ICD-10-CM | POA: Diagnosis not present

## 2023-08-22 DIAGNOSIS — Z7982 Long term (current) use of aspirin: Secondary | ICD-10-CM | POA: Diagnosis not present

## 2023-08-22 DIAGNOSIS — M48062 Spinal stenosis, lumbar region with neurogenic claudication: Secondary | ICD-10-CM | POA: Diagnosis not present

## 2023-08-22 DIAGNOSIS — Z720 Tobacco use: Secondary | ICD-10-CM | POA: Diagnosis not present

## 2023-08-22 DIAGNOSIS — Z9181 History of falling: Secondary | ICD-10-CM | POA: Diagnosis not present

## 2023-08-22 DIAGNOSIS — K921 Melena: Secondary | ICD-10-CM | POA: Diagnosis not present

## 2023-08-22 DIAGNOSIS — D62 Acute posthemorrhagic anemia: Secondary | ICD-10-CM | POA: Diagnosis not present

## 2023-08-22 DIAGNOSIS — Z4801 Encounter for change or removal of surgical wound dressing: Secondary | ICD-10-CM | POA: Diagnosis not present

## 2023-08-22 DIAGNOSIS — I70222 Atherosclerosis of native arteries of extremities with rest pain, left leg: Secondary | ICD-10-CM | POA: Diagnosis not present

## 2023-08-22 NOTE — Progress Notes (Signed)
Vanschaick, [name] [name] ([id number]) [phone].pdf Page 1 of 11 Visit Report for 08/18/2023 Arrival Information Details Patient Name: Date of Service: [name], [name] 08/18/2023 1:45 PM Medical Record Number: [id number] Patient Account Number: [id number] Date of Birth/Sex: Treating RN: [birth year] (72 y.o. M) Primary Care [name] [name]: [name] [name] Other Clinician: Referring [name] [name]: Treating [name] [name]/Extender: [name] [name] Weeks in Treatment: 3 Visit Information History Since Last Visit Added or deleted any medications: No Patient Arrived: [name] [name] Any new allergies or adverse reactions: No Arrival Time: 14:11 Had [name] fall or experienced change in No Accompanied By: wife activities of daily living that may affect Transfer Assistance: None risk of falls: Patient Identification Verified: Yes Signs or symptoms of abuse/neglect since last visito No Secondary Verification Process Completed: Yes Hospitalized since last visit: No Patient Requires Transmission-Based Precautions: No Implantable device outside of the clinic excluding No Patient Has Alerts: Yes cellular tissue based products placed in the center Patient Alerts: ABI R 0.68 (07/15/23) since last visit: ABI L 1.13 (07/15/23) Pain Present Now: No Electronic Signature(s) Signed: 08/22/2023 9:29:02 AM By: Dayton Scrape Entered By: Dayton Scrape on 08/18/2023 14:11:39 -------------------------------------------------------------------------------- Encounter Discharge Information Details Patient Name: Date of Service: [name] [name], [name] [name]. 08/18/2023 1:45 PM Medical Record Number: [id number] Patient Account Number: [id number] Date of Birth/Sex: Treating RN: 22-Dec-1950 (72 y.o. [name] [name] Primary Care [name] [name]: [name] [name] Other Clinician: Referring [name] [name]: Treating Mari Battaglia/Extender: [name] [name] Weeks in Treatment: 3 Encounter Discharge Information Items Post Procedure  Vitals Discharge Condition: Stable Temperature (F): 98.6 Ambulatory Status: Walker Pulse (bpm): 73 Discharge Destination: Home Respiratory Rate (breaths/min): 20 Transportation: Private Auto Blood Pressure (mmHg): 168/75 Accompanied By: spouse Schedule Follow-up Appointment: Yes Clinical Summary of Care: Patient Declined Electronic Signature(s) Signed: 08/18/2023 6:01:39 PM By: Karie Schwalbe RN Entered By: Karie Schwalbe on 08/18/2023 17:57:57 [name] [name], [name] [name] ([id number]) [id number] Page 2 of 11 -------------------------------------------------------------------------------- Lower Extremity Assessment Details Patient Name: Date of Service: [name], [name] 08/18/2023 1:45 PM Medical Record Number: [id number] Patient Account Number: [id number] Date of Birth/Sex: Treating RN: 04-Jun-1951 (72 y.o. [name] [name] Primary Care Deseray Daponte: [name] [name] Other Clinician: Referring Sadler Teschner: Treating Jakeisha Stricker/Extender: [name] [name] Weeks in Treatment: 3 Edema Assessment Assessed: [Left: No] [Right: No] Edema: [Left: Ye] [Right: s] Calf Left: Right: Point of Measurement: From Medial Instep 43 cm Ankle Left: Right: Point of Measurement: From Medial Instep 30.5 cm Vascular Assessment Pulses: Dorsalis Pedis Palpable: [Left:Yes] Extremity colors, hair growth, and conditions: Extremity Color: [Left:Normal] Hair Growth on Extremity: [Left:No] Temperature of Extremity: [Left:Warm < 3 seconds] Toe Nail Assessment Left: Right: Thick: Yes Discolored: No Deformed: No Improper Length and Hygiene: No Electronic Signature(s) Signed: 08/18/2023 6:01:39 PM By: Karie Schwalbe RN Entered By: Karie Schwalbe on 08/18/2023 14:54:40 -------------------------------------------------------------------------------- Multi Wound Chart Details Patient Name: Date of Service: [name] [name], [name] [name]. 08/18/2023 1:45 PM Medical Record Number: [id number] Patient  Account Number: [id number] Date of Birth/Sex: Treating RN: Aug 05, 1951 (72 y.o. M) Primary Care Tzirel Leonor: [name] [name] Other Clinician: Referring Jamea Robicheaux: Treating Bilan Tedesco/Extender: Minette Brine, Dhruv Weeks in Treatment: 3 Vital Signs Height(in): 68 Pulse(bpm): 73 Weight(lbs): 198 Blood Pressure(mmHg): 168/75 Body Mass Index(BMI): 30.1 Temperature(F): 98.6 Respiratory Rate(breaths/min): 20 Melder, Cabe [name] ([id number]) [id number] Page 3 of 11 [1:Photos:] Left, Dorsal Foot Left Amputation Site - Toe Left T Great oe Wound Location: Gradually Appeared Surgical Injury Gradually Appeared Wounding Event: Diabetic Wound/Ulcer of the Lower Diabetic Wound/Ulcer of the Lower Diabetic Wound/Ulcer of the Lower Primary Etiology: Extremity Extremity Extremity N/[name]  Arterial Insufficiency Ulcer Secondary Etiology: Anemia, Hypertension, Peripheral Anemia, Hypertension, Peripheral Anemia, Hypertension, Peripheral Comorbid History: Arterial Disease, Type II Diabetes, Arterial Disease, Type II Diabetes, Arterial Disease, Type II Diabetes, Neuropathy, Confinement Anxiety Neuropathy, Confinement Anxiety Neuropathy, Confinement Anxiety 06/02/2023 06/02/2023 06/07/2023 Date Acquired: 3 3 3  Weeks of Treatment: Open Open Open Wound Status: No No No Wound Recurrence: 0.5x0.5x0.1 6.5x3x0.8 5.9x1.9x0.2 Measurements L x W x D (cm) 0.196 15.315 8.804 [name] (cm) : rea 0.02 12.252 1.761 Volume (cm) : 58.40% -56.00% 50.20% % Reduction in [name] rea: 57.40% -149.60% 0.30% % Reduction in Volume: Grade 2 Grade 3 Grade 4 Classification: Medium Medium Medium Exudate [name] mount: Serous Serosanguineous Serosanguineous Exudate Type: amber red, brown red, brown Exudate Color: Flat and Intact Flat and Intact Flat and Intact Wound Margin: Small (1-33%) Small (1-33%) Medium (34-66%) Granulation [name] mount: Pink Red Red, Pink Granulation Quality: Large (67-100%) Large (67-100%)  Medium (34-66%) Necrotic [name] mount: Adherent Slough Adherent [employer], Adherent Slough Necrotic Tissue: Fat Layer (Subcutaneous Tissue): Yes Fat Layer (Subcutaneous Tissue): Yes Fat Layer (Subcutaneous Tissue): Yes Exposed Structures: Fascia: No Bone: Yes Fascia: No Tendon: No Fascia: No Tendon: No Muscle: No Tendon: No Muscle: No Joint: No Muscle: No Joint: No Bone: No Joint: No Bone: No Small (1-33%) Small (1-33%) Small (1-33%) Epithelialization: Debridement - Excisional Debridement - Excisional Debridement - Excisional Debridement: Pre-procedure Verification/Time Out 15:35 15:35 15:35 Taken: Lidocaine 4% Topical Solution Lidocaine 4% Topical Solution Lidocaine 4% Topical Solution Pain Control: Subcutaneous, Slough Subcutaneous, Slough Subcutaneous, Slough Tissue Debrided: Skin/Subcutaneous Tissue Skin/Subcutaneous Tissue Skin/Subcutaneous Tissue Level: 0.2 15.31 8.8 Debridement [name] (sq cm): rea Curette Curette Curette Instrument: Minimum Minimum Minimum Bleeding: Pressure Pressure Pressure Hemostasis [name] chieved: Procedure was tolerated well Procedure was tolerated well Procedure was tolerated well Debridement Treatment Response: 0.5x0.5x0.3 6.5x3x0.8 5.9x1.9x0.3 Post Debridement Measurements L x W x D (cm) 0.059 12.252 2.641 Post Debridement Volume: (cm) No Abnormalities Noted No Abnormalities Noted No Abnormalities Noted Periwound Skin Texture: Dry/Scaly: Yes Dry/Scaly: Yes Dry/Scaly: Yes Periwound Skin Moisture: No Abnormalities Noted No Abnormalities Noted No Abnormalities Noted Periwound Skin Color: No Abnormality No Abnormality No Abnormality Temperature: Yes Yes Yes Tenderness on Palpation: Debridement Debridement Debridement Procedures Performed: Treatment Notes Electronic Signature(s) Signed: 08/18/2023 5:17:16 PM By: Baltazar Najjar MD Entered By: Baltazar Najjar on 08/18/2023 16:47:16 [name] [name], [name] [name] ([id number])  [id number] Page 4 of 11 -------------------------------------------------------------------------------- Multi-Disciplinary Care Plan Details Patient Name: Date of Service: KAREN, SOTTO 08/18/2023 1:45 PM Medical Record Number: [id number] Patient Account Number: [id number] Date of Birth/Sex: Treating RN: 1951-08-11 (72 y.o. 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 and treatment procedures Discomfort related to temperature and humidity changes inside hyperbaric chamber Potential for barotraumas to ears, sinuses, teeth, and lungs or cerebral gas embolism related to changes in atmospheric pressure inside hyperbaric oxygen chamber Potential for oxygen toxicity seizures related to delivery of 100% oxygen at an increased atmospheric pressure Potential for pulmonary oxygen toxicity related to delivery of 100% oxygen at an increased atmospheric pressure Goals: Barotrauma will be prevented during HBO2 Date Initiated: 07/22/2023 T arget Resolution Date: 11/04/2023 Goal Status: Active Patient and/or family will be able to state/discuss factors appropriate to the management of their disease process during treatment Date Initiated: 07/22/2023 T arget Resolution Date: 11/04/2023 Goal  Status: Active Patient will tolerate the hyperbaric oxygen therapy treatment Date Initiated: 07/22/2023 T arget Resolution Date: 11/04/2023 Goal Status: Active Patient will tolerate the internal climate of the chamber Date Initiated: 07/22/2023 T arget Resolution Date: 11/04/2023 Goal Status: Active Patient/caregiver will verbalize understanding of HBO goals, rationale,  procedures and potential hazards Date Initiated: 07/22/2023 T arget Resolution Date: 11/04/2023 Goal Status: Active Signs and symptoms of pulmonary oxygen toxicity will be recognized and promptly addressed Date Initiated: 07/22/2023 T arget Resolution Date: 11/04/2023 Goal Status: Active Interventions: Administer [name] five (5) minute air break for patient if signs and symptoms of seizure appear and notify the hyperbaric physician Administer decongestants, per physician orders, prior to HBO2 Administer the correct therapeutic gas delivery based on the patients needs and limitations, per physician order Assess and provide for patients comfort related to the hyperbaric environment and equalization of middle ear Assess for signs and symptoms related to adverse events, including but not limited to confinement anxiety, pneumothorax, oxygen toxicity and baurotrauma Assess patient for any history of confinement anxiety Assess patient's knowledge and expectations regarding hyperbaric medicine and provide education related to the hyperbaric environment, goals of treatment and prevention of adverse events Implement protocols to decrease risk of pneumothorax in high risk patients Notes: Necrotic Tissue Nursing Diagnoses: Impaired tissue integrity related to necrotic/devitalized tissue Knowledge deficit related to management of necrotic/devitalized tissue Goals: Necrotic/devitalized tissue will be minimized in the wound bed Date Initiated: 07/22/2023 Target Resolution Date: 11/04/2023 Goal Status: Active Patient/caregiver will verbalize understanding of reason and process for debridement of necrotic tissue Date Initiated: 07/22/2023 Target Resolution Date: 11/04/2023 Goal Status: Active Interventions: Assess patient pain level pre-, during and post procedure and prior to discharge Provide education on necrotic tissue and debridement process [name] [name], Jiyaan [name] ([id number])  [id number] Page 5 of 11 Treatment Activities: Apply topical anesthetic as ordered : 07/22/2023 Notes: Wound/Skin Impairment Nursing Diagnoses: Impaired tissue integrity Knowledge deficit related to ulceration/compromised skin integrity Goals: Patient/caregiver will verbalize understanding of skin care regimen Date Initiated: 07/22/2023 Target Resolution Date: 11/04/2023 Goal Status: Active Interventions: Assess ulceration(s) every visit Treatment Activities: Skin care regimen initiated : 07/22/2023 Topical wound management initiated : 07/22/2023 Notes: Electronic Signature(s) Signed: 08/18/2023 6:01:39 PM By: Karie Schwalbe RN Entered By: Karie Schwalbe on 08/18/2023 17:52:40 -------------------------------------------------------------------------------- Pain Assessment Details Patient Name: Date of Service: [name] [name], [name] [name]. 08/18/2023 1:45 PM Medical Record Number: [id number] Patient Account Number: [id number] Date of Birth/Sex: Treating RN: 08-Oct-1950 (72 y.o. M) Primary Care Noelie Renfrow: [name] [name] Other Clinician: Referring Holt Woolbright: Treating Clemons Salvucci/Extender: [name] [name] Weeks in Treatment: 3 Active Problems Location of Pain Severity and Description of Pain Patient Has Paino No Site Locations Pain Management and Medication Current Pain Management: Electronic Signature(s) Signed: 08/22/2023 9:29:02 AM By: Dayton Scrape Ballman, Signed: 08/22/2023 9:29:02 AM By: Roetta Sessions ([id number]) [id number] Page 6 of 11 Entered By: Dayton Scrape on 08/18/2023 14:12:30 -------------------------------------------------------------------------------- Patient/Caregiver Education Details Patient Name: Date of Service: [name] [name], [name] [name] 12/12/2024andnbsp1:45 PM Medical Record Number: [id number] Patient Account Number: [id number] Date of Birth/Gender: Treating RN: 1950-11-11 (72 y.o. [name] [name] Primary  Care Physician: [name] [name] Other Clinician: Referring Physician: Treating Physician/Extender: Duncan Dull in Treatment: 3 Education Assessment Education Provided To: Patient Education Topics Provided Wound/Skin Impairment: Methods: Explain/Verbal Responses: State content correctly Electronic Signature(s) Signed: 08/18/2023 6:01:39 PM By: Karie Schwalbe RN Entered By: Karie Schwalbe on 08/18/2023 17:55:39 -------------------------------------------------------------------------------- Wound Assessment Details Patient Name: Date of Service: [name] [name], [name] [name]. 08/18/2023 1:45  PM Medical Record Number: [id number] Patient Account Number: [id number] Date of Birth/Sex: Treating RN: 03/10/1951 (72 y.o. M) Primary Care Skyy [name] [name]: [name] [name] Other Clinician: Referring Jocabed Cheese: Treating Rajat Staver/Extender: Minette Brine, Dhruv Weeks in Treatment: 3 Wound Status Wound Number: 1 Primary Diabetic Wound/Ulcer of the Lower Extremity Etiology: Wound Location: Left, Dorsal Foot Wound Open Wounding Event: Gradually Appeared Status: Date Acquired: 06/02/2023 Comorbid Anemia, Hypertension, Peripheral Arterial Disease, Type II Weeks Of Treatment: 3 History: Diabetes, Neuropathy, Confinement Anxiety Clustered Wound: No Photos Soderholm, Aleksei [name] ([id number]) [id number] Page 7 of 11 Wound Measurements Length: (cm) 0.5 Width: (cm) 0.5 Depth: (cm) 0.1 Area: (cm) 0.196 Volume: (cm) 0.02 % Reduction in Area: 58.4% % Reduction in Volume: 57.4% Epithelialization: Small (1-33%) Tunneling: No Undermining: No Wound Description Classification: Grade 2 Wound Margin: Flat and Intact Exudate Amount: Medium Exudate Type: Serous Exudate Color: amber Foul Odor After Cleansing: No Slough/Fibrino Yes Wound Bed Granulation Amount: Small (1-33%) Exposed Structure Granulation Quality: Pink Fascia Exposed: No Necrotic Amount: Large (67-100%) Fat  Layer (Subcutaneous Tissue) Exposed: Yes Necrotic Quality: Adherent Slough Tendon Exposed: No Muscle Exposed: No Joint Exposed: No Bone Exposed: No Periwound Skin Texture Texture Color No Abnormalities Noted: Yes No Abnormalities Noted: Yes Moisture Temperature / Pain No Abnormalities Noted: No Temperature: No Abnormality Dry / Scaly: Yes Tenderness on Palpation: Yes Treatment Notes Wound #1 (Foot) Wound Laterality: Dorsal, Left Cleanser Soap and Water Discharge Instruction: May shower and wash wound with dial antibacterial soap and water prior to dressing change. Wound Cleanser Discharge Instruction: Cleanse the wound with wound cleanser prior to applying [name] clean dressing using gauze sponges, not tissue or cotton balls. Peri-Wound Care Topical Primary Dressing MediHoney Gel, tube 1.5 (oz) Discharge Instruction: IF SANTYL NOT AVAILABLE - Apply to wound bed as instructed Santyl Ointment Discharge Instruction: Apply nickel thick amount to wound bed as instructed Secondary Dressing Secured With Elastic Bandage 4 inch (ACE bandage) Discharge Instruction: Secure with ACE bandage as directed. Kerlix Roll Sterile, 4.5x3.1 (in/yd) Discharge Instruction: Secure with Kerlix as directed. Transpore Surgical Tape, 2x10 (in/yd) Discharge Instruction: Secure dressing with tape as directed. Compression Wrap Compression Stockings Add-Ons Electronic Signature(s) Signed: 08/18/2023 6:01:39 PM By: Karie Schwalbe RN Entered By: Karie Schwalbe on 08/18/2023 14:40:36 Nordlund, Ludie [name] ([id number]) [phone].pdf Page 8 of 11 -------------------------------------------------------------------------------- Wound Assessment Details Patient Name: Date of Service: [name] [name], [name] [name] 08/18/2023 1:45 PM Medical Record Number: [id number] Patient Account Number: [id number] Date of Birth/Sex: Treating RN: 12/22/1950 (72 y.o. 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 Wound Open Wounding Event: Surgical Injury Status: Date Acquired: 06/02/2023 Comorbid Anemia, Hypertension, Peripheral Arterial Disease, Type II Weeks Of Treatment: 3 History: Diabetes, Neuropathy, Confinement Anxiety Clustered Wound: No Photos Wound Measurements Length: (cm) 6 Width: (cm) 3 Depth: (cm) 0 Area: (cm) Volume: (cm) .5 % Reduction in Area: -56% % Reduction in Volume: -149.6% .8 Epithelialization: Small (1-33%) 15.315 Tunneling: No 12.252 Undermining: No Wound Description Classification: Grade 3 Wound Margin: Flat and Intact Exudate Amount: Medium Exudate Type: Serosanguineous Exudate Color: red, brown Foul Odor After Cleansing: No Slough/Fibrino Yes Wound Bed Granulation Amount: Small (1-33%) Exposed Structure Granulation Quality: Red Fascia Exposed: No Necrotic Amount: Large (67-100%) Fat Layer (Subcutaneous Tissue) Exposed: Yes Necrotic Quality: Adherent Slough Tendon Exposed: No Muscle Exposed: No Joint Exposed: No Bone Exposed: Yes Periwound  Skin Texture Texture Color No Abnormalities Noted: Yes No Abnormalities Noted: Yes Moisture Temperature / Pain No Abnormalities Noted: No Temperature: No Abnormality Dry / Scaly: Yes Tenderness on Palpation: Yes Treatment Notes Wound #2 (Amputation Site - Toe) Wound Laterality: Left Cleanser Soap and Water Banes, Jaiceon [name] ([id number]) [id number] Page 9 of 11 Discharge Instruction: May shower and wash wound with dial antibacterial soap and water prior to dressing change. Wound Cleanser Discharge Instruction: Cleanse the wound with wound cleanser prior to applying [name] clean dressing using gauze sponges, not tissue or cotton  balls. Peri-Wound Care Topical Primary Dressing MediHoney Gel, tube 1.5 (oz) Discharge Instruction: IF SANTYL NOT AVAILABLE - Apply to wound bed as instructed Santyl Ointment Discharge Instruction: Apply nickel thick amount to wound bed as instructed Secondary Dressing ABD Pad, 5x9 Discharge Instruction: Apply over primary dressing as directed. Woven Gauze Sponge, Non-Sterile 4x4 in Discharge Instruction: Moisten gauze with saline and apply over primary dressing as directed. Secured With Elastic Bandage 4 inch (ACE bandage) Discharge Instruction: Secure with ACE bandage as directed. Kerlix Roll Sterile, 4.5x3.1 (in/yd) Discharge Instruction: Secure with Kerlix as directed. Transpore Surgical Tape, 2x10 (in/yd) Discharge Instruction: Secure dressing with tape as directed. Compression Wrap Compression Stockings Add-Ons Electronic Signature(s) Signed: 08/18/2023 6:01:39 PM By: Karie Schwalbe RN Entered By: Karie Schwalbe on 08/18/2023 14:40:56 -------------------------------------------------------------------------------- Wound Assessment Details Patient Name: Date of Service: [name] [name], [name] [name]. 08/18/2023 1:45 PM Medical Record Number: [id number] Patient Account Number: [id number] Date of Birth/Sex: Treating RN: 1951-01-29 (72 y.o. M) Primary Care Willodene Stallings: [name] [name] Other Clinician: Referring Antwine Agosto: Treating Jackob Crookston/Extender: Minette Brine, Dhruv Weeks in Treatment: 3 Wound Status Wound Number: 3 Primary Diabetic Wound/Ulcer of the Lower Extremity Etiology: Wound Location: Left T Great oe Secondary Arterial Insufficiency Ulcer Wounding Event: Gradually Appeared Etiology: Date Acquired: 06/07/2023 Wound Open Weeks Of Treatment: 3 Status: Clustered Wound: No Comorbid Anemia, Hypertension, Peripheral Arterial Disease, Type II History: Diabetes, Neuropathy, Confinement Anxiety Photos [name] [name], [name] [name] ([id number]) [id number] Page 10  of 11 Wound Measurements Length: (cm) 5.9 Width: (cm) 1.9 Depth: (cm) 0.2 Area: (cm) 8.804 Volume: (cm) 1.761 % Reduction in Area: 50.2% % Reduction in Volume: 0.3% Epithelialization: Small (1-33%) Tunneling: No Undermining: No Wound Description Classification: Grade 4 Wound Margin: Flat and Intact Exudate Amount: Medium Exudate Type: Serosanguineous Exudate Color: red, brown Foul Odor After Cleansing: No Slough/Fibrino Yes Wound Bed Granulation Amount: Medium (34-66%) Exposed Structure Granulation Quality: Red, Pink Fascia Exposed: No Necrotic Amount: Medium (34-66%) Fat Layer (Subcutaneous Tissue) Exposed: Yes Necrotic Quality: Eschar, Adherent Slough Tendon Exposed: No Muscle Exposed: No Joint Exposed: No Bone Exposed: No Periwound Skin Texture Texture Color No Abnormalities Noted: Yes No Abnormalities Noted: Yes Moisture Temperature / Pain No Abnormalities Noted: No Temperature: No Abnormality Dry / Scaly: Yes Tenderness on Palpation: Yes Treatment Notes Wound #3 (Toe Great) Wound Laterality: Left Cleanser Soap and Water Discharge Instruction: May shower and wash wound with dial antibacterial soap and water prior to dressing change. Wound Cleanser Discharge Instruction: Cleanse the wound with wound cleanser prior to applying [name] clean dressing using gauze sponges, not tissue or cotton balls. Peri-Wound Care Topical Primary Dressing MediHoney Gel, tube 1.5 (oz) Discharge Instruction: IF SANTYL NOT AVAILABLE - Apply to wound bed as instructed Santyl Ointment Discharge Instruction: Apply nickel thick amount to wound bed as instructed Secondary Dressing ABD Pad, 5x9 Discharge Instruction: Apply over primary dressing as directed. Woven Gauze Sponge, Non-Sterile 4x4 in Discharge Instruction: Moisten gauze with saline and apply over primary dressing  as directed. Secured With Elastic Bandage 4 inch (ACE bandage) Discharge Instruction: Secure with ACE bandage as  directed. [name] [name], [name] [name] ([id number]) [phone].pdf Page 11 of 11 Kerlix Roll Sterile, 4.5x3.1 (in/yd) Discharge Instruction: Secure with Kerlix as directed. Transpore Surgical Tape, 2x10 (in/yd) Discharge Instruction: Secure dressing with tape as directed. Compression Wrap Compression Stockings Add-Ons Electronic Signature(s) Signed: 08/18/2023 6:01:39 PM By: Karie Schwalbe RN Entered By: Karie Schwalbe on 08/18/2023 14:41:13 -------------------------------------------------------------------------------- Vitals Details Patient Name: Date of Service: [name] [name], Juron [name]. 08/18/2023 1:45 PM Medical Record Number: [id number] Patient Account Number: [id number] Date of Birth/Sex: Treating RN: 1951/06/20 (72 y.o. M) Primary Care Mamta Rimmer: [name] [name] Other Clinician: Referring Kervin Bones: Treating Branko Steeves/Extender: Minette Brine, Dhruv Weeks in Treatment: 3 Vital Signs Time Taken: 02:11 Temperature (F): 98.6 Height (in): 68 Pulse (bpm): 73 Weight (lbs): 198 Respiratory Rate (breaths/min): 20 Body Mass Index (BMI): 30.1 Blood Pressure (mmHg): 168/75 Reference Range: 80 - 120 mg / dl Electronic Signature(s) Signed: 08/22/2023 9:29:02 AM By: Dayton Scrape Entered By: Dayton Scrape on 08/18/2023 14:12:24

## 2023-08-24 DIAGNOSIS — D62 Acute posthemorrhagic anemia: Secondary | ICD-10-CM | POA: Diagnosis not present

## 2023-08-24 DIAGNOSIS — K5520 Angiodysplasia of colon without hemorrhage: Secondary | ICD-10-CM | POA: Diagnosis not present

## 2023-08-24 DIAGNOSIS — Z4801 Encounter for change or removal of surgical wound dressing: Secondary | ICD-10-CM | POA: Diagnosis not present

## 2023-08-24 DIAGNOSIS — Z9181 History of falling: Secondary | ICD-10-CM | POA: Diagnosis not present

## 2023-08-24 DIAGNOSIS — Z7982 Long term (current) use of aspirin: Secondary | ICD-10-CM | POA: Diagnosis not present

## 2023-08-24 DIAGNOSIS — M48062 Spinal stenosis, lumbar region with neurogenic claudication: Secondary | ICD-10-CM | POA: Diagnosis not present

## 2023-08-24 DIAGNOSIS — Z89422 Acquired absence of other left toe(s): Secondary | ICD-10-CM | POA: Diagnosis not present

## 2023-08-24 DIAGNOSIS — Z4781 Encounter for orthopedic aftercare following surgical amputation: Secondary | ICD-10-CM | POA: Diagnosis not present

## 2023-08-24 DIAGNOSIS — K921 Melena: Secondary | ICD-10-CM | POA: Diagnosis not present

## 2023-08-24 DIAGNOSIS — Z7902 Long term (current) use of antithrombotics/antiplatelets: Secondary | ICD-10-CM | POA: Diagnosis not present

## 2023-08-24 DIAGNOSIS — E1151 Type 2 diabetes mellitus with diabetic peripheral angiopathy without gangrene: Secondary | ICD-10-CM | POA: Diagnosis not present

## 2023-08-24 DIAGNOSIS — Z720 Tobacco use: Secondary | ICD-10-CM | POA: Diagnosis not present

## 2023-08-24 DIAGNOSIS — I10 Essential (primary) hypertension: Secondary | ICD-10-CM | POA: Diagnosis not present

## 2023-08-24 DIAGNOSIS — I70222 Atherosclerosis of native arteries of extremities with rest pain, left leg: Secondary | ICD-10-CM | POA: Diagnosis not present

## 2023-08-26 DIAGNOSIS — M48062 Spinal stenosis, lumbar region with neurogenic claudication: Secondary | ICD-10-CM | POA: Diagnosis not present

## 2023-08-26 DIAGNOSIS — E1151 Type 2 diabetes mellitus with diabetic peripheral angiopathy without gangrene: Secondary | ICD-10-CM | POA: Diagnosis not present

## 2023-08-26 DIAGNOSIS — Z7982 Long term (current) use of aspirin: Secondary | ICD-10-CM | POA: Diagnosis not present

## 2023-08-26 DIAGNOSIS — Z7902 Long term (current) use of antithrombotics/antiplatelets: Secondary | ICD-10-CM | POA: Diagnosis not present

## 2023-08-26 DIAGNOSIS — I10 Essential (primary) hypertension: Secondary | ICD-10-CM | POA: Diagnosis not present

## 2023-08-26 DIAGNOSIS — K921 Melena: Secondary | ICD-10-CM | POA: Diagnosis not present

## 2023-08-26 DIAGNOSIS — Z4801 Encounter for change or removal of surgical wound dressing: Secondary | ICD-10-CM | POA: Diagnosis not present

## 2023-08-26 DIAGNOSIS — D62 Acute posthemorrhagic anemia: Secondary | ICD-10-CM | POA: Diagnosis not present

## 2023-08-26 DIAGNOSIS — Z9181 History of falling: Secondary | ICD-10-CM | POA: Diagnosis not present

## 2023-08-26 DIAGNOSIS — Z720 Tobacco use: Secondary | ICD-10-CM | POA: Diagnosis not present

## 2023-08-26 DIAGNOSIS — Z4781 Encounter for orthopedic aftercare following surgical amputation: Secondary | ICD-10-CM | POA: Diagnosis not present

## 2023-08-26 DIAGNOSIS — I70222 Atherosclerosis of native arteries of extremities with rest pain, left leg: Secondary | ICD-10-CM | POA: Diagnosis not present

## 2023-08-26 DIAGNOSIS — Z89422 Acquired absence of other left toe(s): Secondary | ICD-10-CM | POA: Diagnosis not present

## 2023-08-26 DIAGNOSIS — K5520 Angiodysplasia of colon without hemorrhage: Secondary | ICD-10-CM | POA: Diagnosis not present

## 2023-08-29 ENCOUNTER — Other Ambulatory Visit: Payer: Self-pay

## 2023-08-29 DIAGNOSIS — Z9181 History of falling: Secondary | ICD-10-CM | POA: Diagnosis not present

## 2023-08-29 DIAGNOSIS — I10 Essential (primary) hypertension: Secondary | ICD-10-CM | POA: Diagnosis not present

## 2023-08-29 DIAGNOSIS — K5520 Angiodysplasia of colon without hemorrhage: Secondary | ICD-10-CM | POA: Diagnosis not present

## 2023-08-29 DIAGNOSIS — M48062 Spinal stenosis, lumbar region with neurogenic claudication: Secondary | ICD-10-CM | POA: Diagnosis not present

## 2023-08-29 DIAGNOSIS — Z4801 Encounter for change or removal of surgical wound dressing: Secondary | ICD-10-CM | POA: Diagnosis not present

## 2023-08-29 DIAGNOSIS — Z720 Tobacco use: Secondary | ICD-10-CM | POA: Diagnosis not present

## 2023-08-29 DIAGNOSIS — D62 Acute posthemorrhagic anemia: Secondary | ICD-10-CM | POA: Diagnosis not present

## 2023-08-29 DIAGNOSIS — E1151 Type 2 diabetes mellitus with diabetic peripheral angiopathy without gangrene: Secondary | ICD-10-CM | POA: Diagnosis not present

## 2023-08-29 DIAGNOSIS — I70222 Atherosclerosis of native arteries of extremities with rest pain, left leg: Secondary | ICD-10-CM | POA: Diagnosis not present

## 2023-08-29 DIAGNOSIS — Z4781 Encounter for orthopedic aftercare following surgical amputation: Secondary | ICD-10-CM | POA: Diagnosis not present

## 2023-08-29 DIAGNOSIS — Z89422 Acquired absence of other left toe(s): Secondary | ICD-10-CM | POA: Diagnosis not present

## 2023-08-29 DIAGNOSIS — Z7982 Long term (current) use of aspirin: Secondary | ICD-10-CM | POA: Diagnosis not present

## 2023-08-29 DIAGNOSIS — Z7902 Long term (current) use of antithrombotics/antiplatelets: Secondary | ICD-10-CM | POA: Diagnosis not present

## 2023-08-29 DIAGNOSIS — K921 Melena: Secondary | ICD-10-CM | POA: Diagnosis not present

## 2023-08-29 MED ORDER — FLUCONAZOLE 200 MG PO TABS: ORAL_TABLET | ORAL | 0 refills | Status: AC

## 2023-09-02 DIAGNOSIS — K921 Melena: Secondary | ICD-10-CM | POA: Diagnosis not present

## 2023-09-02 DIAGNOSIS — Z7982 Long term (current) use of aspirin: Secondary | ICD-10-CM | POA: Diagnosis not present

## 2023-09-02 DIAGNOSIS — Z7902 Long term (current) use of antithrombotics/antiplatelets: Secondary | ICD-10-CM | POA: Diagnosis not present

## 2023-09-02 DIAGNOSIS — M48062 Spinal stenosis, lumbar region with neurogenic claudication: Secondary | ICD-10-CM | POA: Diagnosis not present

## 2023-09-02 DIAGNOSIS — I10 Essential (primary) hypertension: Secondary | ICD-10-CM | POA: Diagnosis not present

## 2023-09-02 DIAGNOSIS — Z4781 Encounter for orthopedic aftercare following surgical amputation: Secondary | ICD-10-CM | POA: Diagnosis not present

## 2023-09-02 DIAGNOSIS — Z89422 Acquired absence of other left toe(s): Secondary | ICD-10-CM | POA: Diagnosis not present

## 2023-09-02 DIAGNOSIS — E1151 Type 2 diabetes mellitus with diabetic peripheral angiopathy without gangrene: Secondary | ICD-10-CM | POA: Diagnosis not present

## 2023-09-02 DIAGNOSIS — D62 Acute posthemorrhagic anemia: Secondary | ICD-10-CM | POA: Diagnosis not present

## 2023-09-02 DIAGNOSIS — Z9181 History of falling: Secondary | ICD-10-CM | POA: Diagnosis not present

## 2023-09-02 DIAGNOSIS — Z4801 Encounter for change or removal of surgical wound dressing: Secondary | ICD-10-CM | POA: Diagnosis not present

## 2023-09-02 DIAGNOSIS — I70222 Atherosclerosis of native arteries of extremities with rest pain, left leg: Secondary | ICD-10-CM | POA: Diagnosis not present

## 2023-09-02 DIAGNOSIS — Z720 Tobacco use: Secondary | ICD-10-CM | POA: Diagnosis not present

## 2023-09-02 DIAGNOSIS — K5520 Angiodysplasia of colon without hemorrhage: Secondary | ICD-10-CM | POA: Diagnosis not present

## 2023-09-05 DIAGNOSIS — Z89422 Acquired absence of other left toe(s): Secondary | ICD-10-CM | POA: Diagnosis not present

## 2023-09-05 DIAGNOSIS — I10 Essential (primary) hypertension: Secondary | ICD-10-CM | POA: Diagnosis not present

## 2023-09-05 DIAGNOSIS — K921 Melena: Secondary | ICD-10-CM | POA: Diagnosis not present

## 2023-09-05 DIAGNOSIS — Z7902 Long term (current) use of antithrombotics/antiplatelets: Secondary | ICD-10-CM | POA: Diagnosis not present

## 2023-09-05 DIAGNOSIS — Z4781 Encounter for orthopedic aftercare following surgical amputation: Secondary | ICD-10-CM | POA: Diagnosis not present

## 2023-09-05 DIAGNOSIS — Z7982 Long term (current) use of aspirin: Secondary | ICD-10-CM | POA: Diagnosis not present

## 2023-09-05 DIAGNOSIS — Z9181 History of falling: Secondary | ICD-10-CM | POA: Diagnosis not present

## 2023-09-05 DIAGNOSIS — Z720 Tobacco use: Secondary | ICD-10-CM | POA: Diagnosis not present

## 2023-09-05 DIAGNOSIS — I70222 Atherosclerosis of native arteries of extremities with rest pain, left leg: Secondary | ICD-10-CM | POA: Diagnosis not present

## 2023-09-05 DIAGNOSIS — D62 Acute posthemorrhagic anemia: Secondary | ICD-10-CM | POA: Diagnosis not present

## 2023-09-05 DIAGNOSIS — E1151 Type 2 diabetes mellitus with diabetic peripheral angiopathy without gangrene: Secondary | ICD-10-CM | POA: Diagnosis not present

## 2023-09-05 DIAGNOSIS — Z4801 Encounter for change or removal of surgical wound dressing: Secondary | ICD-10-CM | POA: Diagnosis not present

## 2023-09-05 DIAGNOSIS — M48062 Spinal stenosis, lumbar region with neurogenic claudication: Secondary | ICD-10-CM | POA: Diagnosis not present

## 2023-09-05 DIAGNOSIS — K5520 Angiodysplasia of colon without hemorrhage: Secondary | ICD-10-CM | POA: Diagnosis not present

## 2023-09-09 DIAGNOSIS — M48062 Spinal stenosis, lumbar region with neurogenic claudication: Secondary | ICD-10-CM | POA: Diagnosis not present

## 2023-09-09 DIAGNOSIS — Z4781 Encounter for orthopedic aftercare following surgical amputation: Secondary | ICD-10-CM | POA: Diagnosis not present

## 2023-09-09 DIAGNOSIS — Z9181 History of falling: Secondary | ICD-10-CM | POA: Diagnosis not present

## 2023-09-09 DIAGNOSIS — K5520 Angiodysplasia of colon without hemorrhage: Secondary | ICD-10-CM | POA: Diagnosis not present

## 2023-09-09 DIAGNOSIS — K921 Melena: Secondary | ICD-10-CM | POA: Diagnosis not present

## 2023-09-09 DIAGNOSIS — Z7982 Long term (current) use of aspirin: Secondary | ICD-10-CM | POA: Diagnosis not present

## 2023-09-09 DIAGNOSIS — I70222 Atherosclerosis of native arteries of extremities with rest pain, left leg: Secondary | ICD-10-CM | POA: Diagnosis not present

## 2023-09-09 DIAGNOSIS — E1151 Type 2 diabetes mellitus with diabetic peripheral angiopathy without gangrene: Secondary | ICD-10-CM | POA: Diagnosis not present

## 2023-09-09 DIAGNOSIS — Z720 Tobacco use: Secondary | ICD-10-CM | POA: Diagnosis not present

## 2023-09-09 DIAGNOSIS — Z7902 Long term (current) use of antithrombotics/antiplatelets: Secondary | ICD-10-CM | POA: Diagnosis not present

## 2023-09-09 DIAGNOSIS — Z4801 Encounter for change or removal of surgical wound dressing: Secondary | ICD-10-CM | POA: Diagnosis not present

## 2023-09-09 DIAGNOSIS — Z89422 Acquired absence of other left toe(s): Secondary | ICD-10-CM | POA: Diagnosis not present

## 2023-09-09 DIAGNOSIS — I10 Essential (primary) hypertension: Secondary | ICD-10-CM | POA: Diagnosis not present

## 2023-09-09 DIAGNOSIS — D62 Acute posthemorrhagic anemia: Secondary | ICD-10-CM | POA: Diagnosis not present

## 2023-09-15 ENCOUNTER — Encounter (HOSPITAL_BASED_OUTPATIENT_CLINIC_OR_DEPARTMENT_OTHER): Payer: Medicare HMO | Attending: General Surgery | Admitting: General Surgery

## 2023-09-15 DIAGNOSIS — L97523 Non-pressure chronic ulcer of other part of left foot with necrosis of muscle: Secondary | ICD-10-CM | POA: Diagnosis not present

## 2023-09-15 DIAGNOSIS — E11621 Type 2 diabetes mellitus with foot ulcer: Secondary | ICD-10-CM | POA: Diagnosis not present

## 2023-09-15 DIAGNOSIS — I70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: Secondary | ICD-10-CM | POA: Insufficient documentation

## 2023-09-15 DIAGNOSIS — E11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: Secondary | ICD-10-CM | POA: Insufficient documentation

## 2023-09-15 DIAGNOSIS — L97526 Non-pressure chronic ulcer of other part of left foot with bone involvement without evidence of necrosis: Secondary | ICD-10-CM | POA: Diagnosis not present

## 2023-09-15 DIAGNOSIS — L97522 Non-pressure chronic ulcer of other part of left foot with fat layer exposed: Secondary | ICD-10-CM | POA: Insufficient documentation

## 2023-09-15 LAB — GLUCOSE, CAPILLARY
Glucose-Capillary: 127 mg/dL — ABNORMAL HIGH (ref 70–99)
Glucose-Capillary: 172 mg/dL — ABNORMAL HIGH (ref 70–99)
Glucose-Capillary: 130 mg/dL — ABNORMAL HIGH (ref 70–99)

## 2023-09-16 ENCOUNTER — Encounter (HOSPITAL_BASED_OUTPATIENT_CLINIC_OR_DEPARTMENT_OTHER): Payer: Medicare HMO | Admitting: General Surgery

## 2023-09-16 DIAGNOSIS — Z9181 History of falling: Secondary | ICD-10-CM | POA: Diagnosis not present

## 2023-09-16 DIAGNOSIS — Z7982 Long term (current) use of aspirin: Secondary | ICD-10-CM | POA: Diagnosis not present

## 2023-09-16 DIAGNOSIS — Z4801 Encounter for change or removal of surgical wound dressing: Secondary | ICD-10-CM | POA: Diagnosis not present

## 2023-09-16 DIAGNOSIS — E1151 Type 2 diabetes mellitus with diabetic peripheral angiopathy without gangrene: Secondary | ICD-10-CM | POA: Diagnosis not present

## 2023-09-16 DIAGNOSIS — I10 Essential (primary) hypertension: Secondary | ICD-10-CM | POA: Diagnosis not present

## 2023-09-16 DIAGNOSIS — Z4781 Encounter for orthopedic aftercare following surgical amputation: Secondary | ICD-10-CM | POA: Diagnosis not present

## 2023-09-16 DIAGNOSIS — Z7902 Long term (current) use of antithrombotics/antiplatelets: Secondary | ICD-10-CM | POA: Diagnosis not present

## 2023-09-16 DIAGNOSIS — Z720 Tobacco use: Secondary | ICD-10-CM | POA: Diagnosis not present

## 2023-09-16 DIAGNOSIS — K5520 Angiodysplasia of colon without hemorrhage: Secondary | ICD-10-CM | POA: Diagnosis not present

## 2023-09-16 DIAGNOSIS — Z89422 Acquired absence of other left toe(s): Secondary | ICD-10-CM | POA: Diagnosis not present

## 2023-09-16 DIAGNOSIS — E11621 Type 2 diabetes mellitus with foot ulcer: Secondary | ICD-10-CM | POA: Diagnosis not present

## 2023-09-16 DIAGNOSIS — K921 Melena: Secondary | ICD-10-CM | POA: Diagnosis not present

## 2023-09-16 DIAGNOSIS — D62 Acute posthemorrhagic anemia: Secondary | ICD-10-CM | POA: Diagnosis not present

## 2023-09-16 DIAGNOSIS — I70222 Atherosclerosis of native arteries of extremities with rest pain, left leg: Secondary | ICD-10-CM | POA: Diagnosis not present

## 2023-09-16 DIAGNOSIS — M48062 Spinal stenosis, lumbar region with neurogenic claudication: Secondary | ICD-10-CM | POA: Diagnosis not present

## 2023-09-16 LAB — GLUCOSE, CAPILLARY: Glucose-Capillary: 138 mg/dL — ABNORMAL HIGH (ref 70–99)

## 2023-09-16 NOTE — Progress Notes (Signed)
 [name] [name] ([id number]) [phone].pdf Page 1 of 4 Visit Report for 09/16/2023 Arrival Information Details Patient Name: Date of Service: [name] [name], [name] [name] 09/16/2023 9:30 [name] Medical Record Number: [id number] Patient Account Number: [id number] Date of Birth/Sex: Treating RN: 01-11-1951 (73 y.o. NETTY Drury Nestle Primary Care Shamiyah Ngu: Rosamond Mon Other Clinician: Karolyn Sharper Referring Nimsi Males: Treating Zahli Vetsch/Extender: Marolyn Delon Rosamond Mon Weeks in Treatment: 8 Visit Information History Since Last Visit All ordered tests and consults were completed: Yes Patient Arrived: Ambulatory Added or deleted any medications: No Arrival Time: 09:00 Any new allergies or adverse reactions: No Accompanied By: self Had [name] fall or experienced change in No Transfer Assistance: None activities of daily living that may affect Patient Identification Verified: Yes risk of falls: Secondary Verification Process Completed: Yes Signs or symptoms of abuse/neglect since last visito No Patient Requires Transmission-Based Precautions: No Hospitalized since last visit: No Patient Has Alerts: Yes Implantable device outside of the clinic excluding No Patient Alerts: ABI R 0.68 (07/15/23) cellular tissue based products placed in the center ABI L 1.13 (07/15/23) since last visit: Pain Present Now: No Electronic Signature(s) Signed: 09/16/2023 2:14:31 PM By: Karolyn Sharper CHT EMT BS , , Entered By: Karolyn Sharper on 09/16/2023 14:14:31 -------------------------------------------------------------------------------- Clinic Level of Care Assessment Details Patient Name: Date of Service: [name] [name] 09/16/2023 9:30 [name] Medical Record Number: [id number] Patient Account Number: [id number] Date of Birth/Sex: Treating RN: 1951-07-22 (73 y.o. NETTY Drury Nestle Primary Care Audrie Kuri: Rosamond Mon Other Clinician: Karolyn Sharper Referring Javante Nilsson: Treating  Aella Ronda/Extender: Marolyn Delon Rosamond Mon Weeks in Treatment: 8 Clinic Level of Care Assessment Items TOOL 4 Quantity Score []  - 0 Use when only an EandM is performed on FOLLOW-UP visit ASSESSMENTS - Nursing Assessment / Reassessment []  - 0 Reassessment of Co-morbidities (includes updates in patient status) []  - 0 Reassessment of Adherence to Treatment Plan ASSESSMENTS - Wound and Skin [name] ssessment / Reassessment []  - 0 Simple Wound Assessment / Reassessment - one wound []  - 0 Complex Wound Assessment / Reassessment - multiple wounds []  - 0 Dermatologic / Skin Assessment (not related to wound area) ASSESSMENTS - Focused Assessment []  - 0 Circumferential Edema Measurements - multi extremities []  - 0 Nutritional Assessment / Counseling / Intervention [name] [name] ([id number]) [id number] Page 2 of 4 []  - 0 Lower Extremity Assessment (monofilament, tuning fork, pulses) []  - 0 Peripheral Arterial Disease Assessment (using hand held doppler) ASSESSMENTS - Ostomy and/or Continence Assessment and Care []  - 0 Incontinence Assessment and Management []  - 0 Ostomy Care Assessment and Management (repouching, etc.) PROCESS - Coordination of Care []  - 0 Simple Patient / Family Education for ongoing care []  - 0 Complex (extensive) Patient / Family Education for ongoing care []  - 0 Staff obtains Chiropractor, Records, T Results / Process Orders est []  - 0 Staff telephones HHA, Nursing Homes / Clarify orders / etc []  - 0 Routine Transfer to another Facility (non-emergent condition) []  - 0 Routine Hospital Admission (non-emergent condition) []  - 0 New Admissions / Manufacturing Engineer / Ordering NPWT Apligraf, etc. , []  - 0 Emergency Hospital Admission (emergent condition) []  - 0 Simple Discharge Coordination []  - 0 Complex (extensive) Discharge Coordination PROCESS - Special Needs []  - 0 Pediatric / Minor Patient Management []  - 0 Isolation  Patient Management []  - 0 Hearing / Language / Visual special needs []  - 0 Assessment of Community assistance (transportation, D/C planning, etc.) []  - 0 Additional assistance / Altered mentation []  - 0  Support Surface(s) Assessment (bed, cushion, seat, etc.) INTERVENTIONS - Wound Cleansing / Measurement []  - 0 Simple Wound Cleansing - one wound []  - 0 Complex Wound Cleansing - multiple wounds []  - 0 Wound Imaging (photographs - any number of wounds) []  - 0 Wound Tracing (instead of photographs) []  - 0 Simple Wound Measurement - one wound []  - 0 Complex Wound Measurement - multiple wounds INTERVENTIONS - Wound Dressings []  - 0 Small Wound Dressing one or multiple wounds []  - 0 Medium Wound Dressing one or multiple wounds []  - 0 Large Wound Dressing one or multiple wounds []  - 0 Application of Medications - topical []  - 0 Application of Medications - injection INTERVENTIONS - Miscellaneous []  - 0 External ear exam X- 1 5 Specimen Collection (cultures, biopsies, blood, body fluids, etc.) []  - 0 Specimen(s) / Culture(s) sent or taken to Lab for analysis []  - 0 Patient Transfer (multiple staff / Nurse, Adult / Similar devices) []  - 0 Simple Staple / Suture removal (25 or less) []  - 0 Complex Staple / Suture removal (26 or more) []  - 0 Hypo / Hyperglycemic Management (close monitor of Blood Glucose) [name], Elver [name] ([id number]) [id number] Page 3 of 4 []  - 0 Ankle / Brachial Index (ABI) - do not check if billed separately X- 1 5 Vital Signs Has the patient been seen at the hospital within the last three years: Yes Total Score: 10 Level Of Care: New/Established - Level 1 Electronic Signature(s) Unsigned Entered By: Karolyn Sharper on 09/16/2023 14:16:05 -------------------------------------------------------------------------------- Encounter Discharge Information Details Patient Name: Date of Service: [name] [name] 09/16/2023 9:30 [name]  M Medical Record Number: [id number] Patient Account Number: [id number] Date of Birth/Sex: Treating RN: [birth year] (73 y.o. NETTY Drury Nestle Primary Care Zohan Shiflet: Rosamond Mon Other Clinician: Karolyn Sharper Referring Latreece Mochizuki: Treating Korin Hartwell/Extender: Marolyn Delon Rosamond Mon Weeks in Treatment: 8 Encounter Discharge Information Items Discharge Condition: Stable Ambulatory Status: Ambulatory Discharge Destination: Home Transportation: Other Accompanied By: driver Schedule Follow-up Appointment: No Clinical Summary of Care: Electronic Signature(s) Signed: 09/16/2023 2:20:05 PM By: Karolyn Sharper CHT EMT BS , , Entered By: Karolyn Sharper on 09/16/2023 14:20:05 -------------------------------------------------------------------------------- General Visit Notes Details Patient Name: Date of Service: FLOYDE KATHEY, Clerence [name]. 09/16/2023 9:30 [name] Medical Record Number: [id number] Patient Account Number: [id number] Date of Birth/Sex: Treating RN: 03/24/1951 (73 y.o. NETTY Drury Nestle Primary Care Salathiel Ferrara: Rosamond Mon Other Clinician: Karolyn Sharper Referring Yarel Kilcrease: Treating Jeanet Lupe/Extender: Marolyn Delon Rosamond Mon Weeks in Treatment: 8 Notes Mr. Hockett arrived with normal vital signs. He prepared for treatment. After performing [name] safety check, patient was ready for treatment. 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 Treatment rescheduled for Monday, Sep 19, 2023 contingent on medication/control of anxiety. Electronic Signature(s) Signed: 09/16/2023 2:19:05 PM By: Karolyn Sharper CHT EMT BS , , Entered By: Karolyn Sharper on 09/16/2023 14:19:05 Esco, CHRISTOPHER [name] ([id number]) [id number] Page 4 of 4 -------------------------------------------------------------------------------- Vitals Details Patient Name: Date of Service: ADELBERT, GASPARD 09/16/2023 9:30 [name] Medical Record Number:  [id number] Patient Account Number: [id number] Date of Birth/Sex: Treating RN: 04-Jun-1951 (73 y.o. NETTY Drury Nestle Primary Care Lititia Sen: Rosamond Mon Other Clinician: Karolyn Sharper Referring Adric Wrede: Treating Bart Ashford/Extender: Marolyn Delon Rosamond Mon Weeks in Treatment: 8 Vital Signs Time Taken: 09:53 Temperature (F): 97.7 Height (in): 68 Pulse (bpm): 96 Weight (lbs): 198 Respiratory Rate (breaths/min): 18 Body Mass Index (BMI): 30.1 Blood Pressure (mmHg): 144/88 Capillary Blood Glucose (mg/dl): 861 Reference Range: 80 - 120  mg / dl Electronic Signature(s) Signed: 09/16/2023 2:14:55 PM By: Karolyn Sharper CHT EMT BS , , Entered By: Karolyn Sharper on 09/16/2023 14:14:54

## 2023-09-16 NOTE — Progress Notes (Addendum)
 [name] [name] ([id number]) [phone].pdf Page 1 of 3 Visit Report for 09/15/2023 HBO Details Patient Name: Date of Service: [name] [name] 09/15/2023 11:30 [name] Medical Record Number: [id number] Patient Account Number: [id number] Date of Birth/Sex: Treating RN: May 30, 1951 (73 y.o. 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 T Great oe HBO Treatment Details Treatment Number: 1 Patient Type: Outpatient Chamber Type: Monoplace Chamber Serial #: [id number] Treatment Protocol: 2.5 ATA with 90 minutes oxygen , with two 5 minute air breaks Treatment Details Compression Rate Down: 1.0 psi / minute De-Compression Rate Up: 3.0 psi / minute [name] breaks and ir breathing Compress Tx Pressure Decompress Decompress periods Begins Reached Begins Ends (leave unused spaces blank) Chamber Pressure (ATA 1 2.5 2.5 2.5 2.5 2.5 - - 2.5 1 ) Clock Time (24 hr) 12:41 13:10 - - - - - - 13:20 13:27 Treatment Length: 46 (minutes) Treatment Segments: 2 Vital Signs Capillary Blood Glucose Reference Range: 80 - 120 mg / dl HBO Diabetic Blood Glucose Intervention Range: <131 mg/dl or >750 mg/dl Type: Time Vitals Blood Pulse: Respiratory Temperature: Capillary Blood Glucose Pulse Action Taken: Pressure: Rate: Glucose (mg/dl): Meter #: Oximetry (%) Taken: Pre 11:48 171/83 96 18 98.1 127 Pre 12:08 172 Cleared for HBOT per protocol Post 13:30 160/80 70 18 98.4 130 none per protocol. Treatment Response Treatment Toleration: Well Adverse Events: 1:Confinement Anxiety Treatment Completion Status: Treatment Aborted/Not  Restarted Reason: Adverse Event Treatment Notes [name] [name] arrived with normal vital signs except for blood glucose level of 127 mg/dL. He was given 8 oz Glucerna per protocol and 8 oz Orange Juice per orders. Patient self-administered Tylenol  Sinus. After patient prepared for treatment blood glucose was re-taken with result of 172 mg/dL at 8791. After performing [name] safety check, patient was placed in the chamber which was compressed at [name] rate set of 1 psi/min decreased to 0.74 psi/min by setting ventilation rate at 450 L/min. Patient reached treatment depth and approximately 10 minutes afterward patient complained of confinement anxiety. As [name] precaution, patient was placed on air break until reaching surface. Patient described his experience as everything was closing in on [him]. He described panicky feeling and something's just not right. Patient continued to request to go faster. Patient traveled average rate of 3.15 psi/min with this technologist continually checking status of patient's ear equalization and pain level. Upon reaching surface level, blood glucose was normal at 130 mg/dL. Other vital signs were within normal range as well. 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 Dr. Marolyn spoke with patient. He will return for another attempt. [name] [name] denied any issues with ear equalization and/or pain associated with barotrauma. Patient was stable upon discharge with his caregiver/family member. Additional Procedure Documentation Tissue Sevierity: Necrosis of muscle Physician HBO Attestation: [name] [name] ([id number]) [phone].pdf Page 2 of 3 I certify that I supervised this HBO treatment in accordance with Medicare guidelines. [name] trained emergency response team is readily available per Yes hospital policies and procedures. Continue HBOT as ordered. Yes Electronic Signature(s) Signed: 09/16/2023 8:16:05 AM By:  [name] [name] Previous Signature: 09/15/2023 8:23:08 PM Version By: [name] [name] CHT EMT BS , , Previous Signature:  09/15/2023 6:04:41 PM Version By: [name] [name] CHT EMT BS , , Previous Signature: 09/15/2023 6:02:26 PM Version By: [name] [name] CHT EMT BS , , Previous Signature: 09/15/2023 6:00:00 PM Version By: [name] [name] CHT EMT BS , , Previous Signature: 09/15/2023 5:58:37 PM Version By: [name] [name] CHT EMT BS , , Entered By: [name] Nest on 09/16/2023 08:16:05 -------------------------------------------------------------------------------- HBO Safety Checklist Details Patient Name: Date of Service: [name] LU, Quan [name]. 09/15/2023 11:30 [name] Medical Record Number: [id number] Patient Account Number: [id number] Date of Birth/Sex: Treating RN: 04/03/1951 (73 y.o. [name] [name] Primary Care Lakea Mittelman: [name] [name] Other Clinician: Karolyn [name] Referring August Gosser: Treating Kearston Putman/Extender: [name] Nest [name] [name] Weeks in Treatment: 7 HBO Safety Checklist Items Safety Checklist Consent Form Signed Patient voided / foley secured and emptied When did you last eato 1130 - 2 boiled eggs, sausage Last dose of injectable or oral agent n/[name] Ostomy pouch emptied and vented if applicable NA All implantable devices assessed, documented and approved NA Intravenous access site secured and place NA Valuables secured Linens and cotton and cotton/polyester blend (less than 51% polyester) Personal oil-based products / skin lotions / body lotions removed Wigs or hairpieces removed NA Smoking or tobacco materials removed NA Books / newspapers / magazines / loose paper removed Cologne, aftershave, perfume and deodorant removed Jewelry removed (may wrap wedding band) Make-up removed NA Hair care products removed Battery operated devices (external) removed Heating patches and chemical warmers removed Titanium eyewear removed Nail polish cured greater  than 10 hours NA Casting material cured greater than 10 hours Hearing aids removed NA Loose dentures or partials removed Prosthetics have been removed NA Patient demonstrates correct use of air break device (if applicable) Patient concerns have been addressed Patient grounding bracelet on and cord attached to chamber Specifics for Inpatients (complete in addition to above) Medication sheet sent with patient NA Intravenous medications needed or due during therapy sent with patient NA Drainage tubes (e.g. nasogastric tube or chest tube secured and vented) NA Endotracheal or Tracheotomy tube secured NA Cuff deflated of air and inflated with saline NA Airway suctioned NA Notes Belinsky, Calvin [name] ([id number]) [id number] Page 3 of 3 Paper version used prior to treatment start. Electronic Signature(s) Signed: 09/15/2023 5:08:02 PM By: [name] [name] CHT EMT BS , , Entered By: [name] [name] on 09/15/2023 17:08:02

## 2023-09-16 NOTE — Progress Notes (Signed)
 Beichner, Murad A ([id number]) [phone].pdf Page 1 of 1 Visit Report for 09/15/2023 SuperBill Details Patient Name: Date of Service: [name] [name], [name] [name] 09/15/2023 Medical Record Number: [id number] Patient Account Number: [id number] Date of Birth/Sex: Treating RN: Jun 11, 1951 (73 y.o. 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 E11.621 Type 2 diabetes mellitus with foot ulcer I73.9 Peripheral vascular disease, unspecified I70.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 Facility Procedures CPT4 Code Description Modifier Quantity [id number] G0277-(Facility Use Only) HBOT full body chamber, , 2 ICD-10 Diagnosis Description E11.621 Type 2 diabetes mellitus with foot ulcer 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 L97.522 Non-pressure chronic ulcer of other part of left foot with fat layer exposed Physician Procedures Quantity CPT4 Code Description Modifier [id number] [phone] - WC PHYS HYPERBARIC OXYGEN  THERAPY 1 ICD-10 Diagnosis Description E11.621 Type 2 diabetes mellitus with foot ulcer 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

## 2023-09-16 NOTE — Progress Notes (Signed)
 [place], Alexey [name] ([id number]) [phone].pdf Page 1 of 2 Visit Report for 09/15/2023 Arrival Information Details Patient Name: Date of Service: [name] [name], [name] [name] 09/15/2023 11:30 [name] Medical Record Number: [id number] Patient Account Number: [id number] Date of Birth/Sex: Treating RN: 1951/07/10 (73 y.o. NETTY Drury Nestle Primary Care Calvert Antolin: Rosamond Mon Other Clinician: Karolyn Sharper Referring Zetha Kuhar: Treating Declyn Offield/Extender: Marolyn Delon Rosamond Mon Weeks in Treatment: 7 Visit Information History Since Last Visit All ordered tests and consults were completed: Yes Patient Arrived: Ambulatory Added or deleted any medications: No Arrival Time: 11:23 Any new allergies or adverse reactions: No Accompanied By: MARCELLA DICKERSON Had [name] fall or experienced change in No activities of daily living that may affect Transfer Assistance: None risk of falls: Patient Identification Verified: Yes Signs or symptoms of abuse/neglect since last visito No Secondary Verification Process Completed: Yes Hospitalized since last visit: No Patient Requires Transmission-Based No Implantable device outside of the clinic excluding No Precautions: cellular tissue based products placed in the center Patient Has Alerts: Yes since last visit: Patient Alerts: ABI R 0.68 (07/15/23) Pain Present Now: No ABI L 1.13 (07/15/23) Electronic Signature(s) Signed: 09/15/2023 5:05:38 PM By: Karolyn Sharper CHT EMT BS , , Entered By: Karolyn Sharper on 09/15/2023 14:05:37 -------------------------------------------------------------------------------- Encounter Discharge Information Details Patient Name: Date of Service: [name] [name], [name] [name]. 09/15/2023 11:30 [name] Medical Record Number: [id number] Patient Account Number: [id number] Date of Birth/Sex: Treating RN: 1951-02-02 (74 y.o. NETTY Drury Nestle Primary Care Kelby Lotspeich: Rosamond Mon Other Clinician: Karolyn Sharper Referring  Jaelle Campanile: Treating Crystale Giannattasio/Extender: Marolyn Delon Rosamond Mon Weeks in Treatment: 7 Encounter Discharge Information Items Discharge Condition: Stable Ambulatory Status: Ambulatory Discharge Destination: Home Transportation: Other Accompanied By: driver/family member Schedule Follow-up Appointment: No Clinical Summary of Care: Electronic Signature(s) Signed: 09/15/2023 6:01:38 PM By: Karolyn Sharper CHT EMT BS , , Entered By: Karolyn Sharper on 09/15/2023 15:01:38 [name] [name], [name] [name] ([id number]) [id number] Page 2 of 2 -------------------------------------------------------------------------------- Vitals Details Patient Name: Date of Service: [name] [name], [name] [name] 09/15/2023 11:30 [name] Medical Record Number: [id number] Patient Account Number: [id number] Date of Birth/Sex: Treating RN: April 06, 1951 (73 y.o. NETTY Drury Nestle Primary Care Jesicca Dipierro: Rosamond Mon Other Clinician: Karolyn Sharper Referring Dewitte Vannice: Treating Jazleen Robeck/Extender: Marolyn Delon Rosamond Mon Weeks in Treatment: 7 Vital Signs Time Taken: 11:48 Temperature (F): 98.1 Height (in): 68 Pulse (bpm): 96 Weight (lbs): 198 Respiratory Rate (breaths/min): 18 Body Mass Index (BMI): 30.1 Blood Pressure (mmHg): 171/83 Capillary Blood Glucose (mg/dl): 872 Reference Range: 80 - 120 mg / dl Electronic Signature(s) Signed: 09/15/2023 5:06:14 PM By: Karolyn Sharper CHT EMT BS , , Entered By: Karolyn Sharper on 09/15/2023 14:06:14

## 2023-09-19 ENCOUNTER — Encounter (HOSPITAL_BASED_OUTPATIENT_CLINIC_OR_DEPARTMENT_OTHER): Payer: Medicare HMO | Admitting: General Surgery

## 2023-09-19 DIAGNOSIS — K5520 Angiodysplasia of colon without hemorrhage: Secondary | ICD-10-CM | POA: Diagnosis not present

## 2023-09-19 DIAGNOSIS — Z4801 Encounter for change or removal of surgical wound dressing: Secondary | ICD-10-CM | POA: Diagnosis not present

## 2023-09-19 DIAGNOSIS — E1151 Type 2 diabetes mellitus with diabetic peripheral angiopathy without gangrene: Secondary | ICD-10-CM | POA: Diagnosis not present

## 2023-09-19 DIAGNOSIS — D62 Acute posthemorrhagic anemia: Secondary | ICD-10-CM | POA: Diagnosis not present

## 2023-09-19 DIAGNOSIS — D649 Anemia, unspecified: Secondary | ICD-10-CM | POA: Diagnosis not present

## 2023-09-19 DIAGNOSIS — Z9181 History of falling: Secondary | ICD-10-CM | POA: Diagnosis not present

## 2023-09-19 DIAGNOSIS — Z7902 Long term (current) use of antithrombotics/antiplatelets: Secondary | ICD-10-CM | POA: Diagnosis not present

## 2023-09-19 DIAGNOSIS — Z4781 Encounter for orthopedic aftercare following surgical amputation: Secondary | ICD-10-CM | POA: Diagnosis not present

## 2023-09-19 DIAGNOSIS — Z89422 Acquired absence of other left toe(s): Secondary | ICD-10-CM | POA: Diagnosis not present

## 2023-09-19 DIAGNOSIS — I10 Essential (primary) hypertension: Secondary | ICD-10-CM | POA: Diagnosis not present

## 2023-09-19 DIAGNOSIS — M48062 Spinal stenosis, lumbar region with neurogenic claudication: Secondary | ICD-10-CM | POA: Diagnosis not present

## 2023-09-19 DIAGNOSIS — I70222 Atherosclerosis of native arteries of extremities with rest pain, left leg: Secondary | ICD-10-CM | POA: Diagnosis not present

## 2023-09-19 DIAGNOSIS — Z7982 Long term (current) use of aspirin: Secondary | ICD-10-CM | POA: Diagnosis not present

## 2023-09-19 DIAGNOSIS — Z720 Tobacco use: Secondary | ICD-10-CM | POA: Diagnosis not present

## 2023-09-19 DIAGNOSIS — K921 Melena: Secondary | ICD-10-CM | POA: Diagnosis not present

## 2023-09-19 NOTE — Progress Notes (Signed)
 Dysert, Shiheem A ([id number]) [phone].pdf Page 1 of 1 Visit Report for 09/16/2023 SuperBill Details Patient Name: Date of Service: [name] [name], [name] [name] 09/16/2023 Medical Record Number: [id number] Patient Account Number: [id number] Date of Birth/Sex: Treating RN: 1951/02/20 (73 y.o. 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

## 2023-09-20 ENCOUNTER — Encounter (HOSPITAL_BASED_OUTPATIENT_CLINIC_OR_DEPARTMENT_OTHER): Payer: Medicare HMO | Admitting: General Surgery

## 2023-09-21 ENCOUNTER — Encounter (HOSPITAL_BASED_OUTPATIENT_CLINIC_OR_DEPARTMENT_OTHER): Payer: Medicare HMO | Admitting: General Surgery

## 2023-09-21 DIAGNOSIS — K5520 Angiodysplasia of colon without hemorrhage: Secondary | ICD-10-CM | POA: Diagnosis not present

## 2023-09-21 DIAGNOSIS — Z7902 Long term (current) use of antithrombotics/antiplatelets: Secondary | ICD-10-CM | POA: Diagnosis not present

## 2023-09-21 DIAGNOSIS — Z7982 Long term (current) use of aspirin: Secondary | ICD-10-CM | POA: Diagnosis not present

## 2023-09-21 DIAGNOSIS — D62 Acute posthemorrhagic anemia: Secondary | ICD-10-CM | POA: Diagnosis not present

## 2023-09-21 DIAGNOSIS — Z4781 Encounter for orthopedic aftercare following surgical amputation: Secondary | ICD-10-CM | POA: Diagnosis not present

## 2023-09-21 DIAGNOSIS — Z89422 Acquired absence of other left toe(s): Secondary | ICD-10-CM | POA: Diagnosis not present

## 2023-09-21 DIAGNOSIS — M48062 Spinal stenosis, lumbar region with neurogenic claudication: Secondary | ICD-10-CM | POA: Diagnosis not present

## 2023-09-21 DIAGNOSIS — Z720 Tobacco use: Secondary | ICD-10-CM | POA: Diagnosis not present

## 2023-09-21 DIAGNOSIS — Z9181 History of falling: Secondary | ICD-10-CM | POA: Diagnosis not present

## 2023-09-21 DIAGNOSIS — E1151 Type 2 diabetes mellitus with diabetic peripheral angiopathy without gangrene: Secondary | ICD-10-CM | POA: Diagnosis not present

## 2023-09-21 DIAGNOSIS — K921 Melena: Secondary | ICD-10-CM | POA: Diagnosis not present

## 2023-09-21 DIAGNOSIS — I10 Essential (primary) hypertension: Secondary | ICD-10-CM | POA: Diagnosis not present

## 2023-09-21 DIAGNOSIS — Z4801 Encounter for change or removal of surgical wound dressing: Secondary | ICD-10-CM | POA: Diagnosis not present

## 2023-09-21 DIAGNOSIS — I70222 Atherosclerosis of native arteries of extremities with rest pain, left leg: Secondary | ICD-10-CM | POA: Diagnosis not present

## 2023-09-22 ENCOUNTER — Encounter (HOSPITAL_BASED_OUTPATIENT_CLINIC_OR_DEPARTMENT_OTHER): Payer: Medicare HMO | Admitting: General Surgery

## 2023-09-22 DIAGNOSIS — E11621 Type 2 diabetes mellitus with foot ulcer: Secondary | ICD-10-CM | POA: Diagnosis not present

## 2023-09-22 DIAGNOSIS — L97524 Non-pressure chronic ulcer of other part of left foot with necrosis of bone: Secondary | ICD-10-CM | POA: Diagnosis not present

## 2023-09-22 LAB — GLUCOSE, CAPILLARY: Glucose-Capillary: 136 mg/dL — ABNORMAL HIGH (ref 70–99)

## 2023-09-22 NOTE — Progress Notes (Signed)
[name], [name] [name] ([id number]) [id number].pdf Page 1 of 2 Visit Report for 09/22/2023 HBO Details Patient Name: Date of Service: [name], [name] 09/22/2023 11:15 [name] Medical Record Number: [id number] Patient Account Number: [id number] Date of Birth/Sex: Treating RN: 11-20-1950 (73 y.o. [name] [name] Primary Care [name] [name]: [name] [name] Other Clinician: Haywood [name] Referring [name] [name]: Treating [name] [name]/Extender: [name] [name] Weeks in Treatment: 8 HBO Treatment Course Details Treatment Course Number: 1 Ordering [name] [name]: [name] [name] T Treatments Ordered: otal 40 HBO Treatment Start Date: 09/15/2023 HBO Indication: Diabetic Ulcer(s) of the Lower Extremity Wound #3 Left T [place] oe HBO Treatment Details Treatment Number: 2 Patient Type: Outpatient Chamber Type: Monoplace Chamber Serial #: [id number] Treatment Protocol: 2.5 ATA with 90 minutes oxygen, with two 5 minute air breaks Treatment Details Compression Rate Down: 1.0 psi / minute De-Compression Rate Up: 2.0 psi / minute [name] breaks and breathing ir Compress Tx Pressure periods Decompress Decompress Begins Reached (leave unused spaces Begins Ends blank) Chamber Pressure (ATA 1 2.5 2.5 2.5 2.5 2.5 - - 2.5 1 ) Clock Time (24 hr) 12:15 12:44 13:14 13:19 13:49 13:54 - - 14:24 14:40 Treatment Length: 145 (minutes) Treatment Segments: 5 Vital Signs Capillary Blood Glucose Reference Range: 80 - 120 mg / dl HBO Diabetic Blood Glucose Intervention Range: <131 mg/dl or >433 mg/dl Type: Time Vitals Blood Respiratory Capillary Blood Glucose Pulse Action Pulse: Temperature: Taken: Pressure: Rate: Glucose (mg/dl): Meter #: Oximetry (%) Taken: Pre 11:27 135/81 81 18 98.6 136 none per protocol Post 14:43 175/80 74 18 97.5 113 none per protocol Treatment Response Treatment Toleration: Well Treatment Completion Status: Treatment Completed without Adverse Event Treatment Notes [name] [name]  arrived with normal vital signs Patient self-administered Afrin. After performing [name] safety check, patient was placed in the chamber which was compressed at [name] rate set of 1 psi/min decreased to 0.74 psi/min by setting ventilation rate at 450 L/min as patient was sleepy due to his medication. He tolerated the treatment and subsequent decompression at the rate of approximately 1.75 psi/min. Post-treatment vital signs were within normal range. [name] [name] denied any issues with ear equalization and/or pain associated with barotrauma. Patient was stable upon discharge to wound care visit in the clinic. Additional Procedure Documentation Tissue Sevierity: Necrosis of bone Physician HBO Attestation: I certify that I supervised this HBO treatment in accordance with Medicare guidelines. [name] trained emergency response team is readily available per Yes hospital policies and procedures. Continue HBOT as ordered. Yes Electronic Signature(s) Signed: 09/22/2023 4:20:14 PM By: [name] Guess MD FACS Previous Signature: 09/22/2023 3:36:25 PM Version By: [name] [name] CHT EMT BS , , Entered By: [name] [name] on 09/22/2023 16:20:14 [name] [name] ([id number]) [id number] Page 2 of 2 -------------------------------------------------------------------------------- HBO Safety Checklist Details Patient Name: Date of Service: [name] [name] 09/22/2023 11:15 [name] Medical Record Number: [id number] Patient Account Number: [id number] Date of Birth/Sex: Treating RN: 04/19/1951 (73 y.o. [name] [name] Primary Care [name] [name]: [name] [name] Other Clinician: Haywood [name] Referring [name] [name]: Treating [name] [name]/Extender: [name] [name] Weeks in Treatment: 8 HBO Safety Checklist Items Safety Checklist Consent Form Signed Patient voided / foley secured and emptied When did you last eato 0915 - [place] Sausage, Boiled Egg, OJ Last dose of injectable or oral agent n/[name] Ostomy  pouch emptied and vented if applicable NA All implantable devices assessed, documented and approved NA Intravenous access site secured and place NA Valuables secured Linens and cotton and cotton/polyester blend (less than 51% polyester) Personal oil-based products /  skin lotions / body lotions removed Wigs or hairpieces removed NA Smoking or tobacco materials removed NA Books / newspapers / magazines / loose paper removed Cologne, aftershave, perfume and deodorant removed Jewelry removed (may wrap wedding band) Make-up removed NA Hair care products removed Battery operated devices (external) removed Heating patches and chemical warmers removed NA Titanium eyewear removed NA Nail polish cured greater than 10 hours NA Casting material cured greater than 10 hours NA Hearing aids removed NA Loose dentures or partials removed dentures removed Prosthetics have been removed NA Patient demonstrates correct use of air break device (if applicable) Patient concerns have been addressed Patient grounding bracelet on and cord attached to chamber Specifics for Inpatients (complete in addition to above) Medication sheet sent with patient NA Intravenous medications needed or due during therapy sent with patient NA Drainage tubes (e.g. nasogastric tube or chest tube secured and vented) NA Endotracheal or Tracheotomy tube secured NA Cuff deflated of air and inflated with saline NA Airway suctioned NA Notes Paper version used prior to treatment start. Electronic Signature(s) Signed: 09/22/2023 2:36:25 PM By: [name] [name] CHT EMT BS , , Entered By: [name] [name] on 09/22/2023 14:36:25

## 2023-09-22 NOTE — Progress Notes (Signed)
Scorza, Saveon A ([id number]) [phone].pdf Page 1 of 1 Visit Report for 09/22/2023 SuperBill Details Patient Name: Date of Service: [name] [name], [name] [name] 09/22/2023 Medical Record Number: [id number] Patient Account Number: [id number] Date of Birth/Sex: Treating RN: 1951-03-16 (73 y.o. 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 E11.621 Type 2 diabetes mellitus with foot ulcer I73.9 Peripheral vascular disease, unspecified I70.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 Facility Procedures CPT4 Code Description Modifier Quantity [id number]-(Facility Use Only) HBOT full body chamber, , 5 ICD-10 Diagnosis Description E11.621 Type 2 diabetes mellitus with foot ulcer 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 L97.522 Non-pressure chronic ulcer of other part of left foot with fat layer exposed Physician Procedures Quantity CPT4 Code Description Modifier [id number] - WC PHYS HYPERBARIC OXYGEN THERAPY 1 ICD-10 Diagnosis Description E11.621 Type 2 diabetes mellitus with foot ulcer 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

## 2023-09-22 NOTE — Progress Notes (Signed)
[name] [name] ([id number]) [id number].pdf Page 1 of 10 Visit Report for 09/22/2023 Arrival Information Details Patient Name: Date of Service: [name] [name], [name] [name] 09/22/2023 1:15 PM Medical Record Number: [id number] Patient Account Number: [id number] Date of Birth/Sex: Treating RN: November 18, 1950 (73 y.o. M) Primary Care Lania Zawistowski: Doreen Beam Other Clinician: Referring Drayton Tieu: Treating Stephany Poorman/Extender: Teofilo Pod Weeks in Treatment: 8 Visit Information History Since Last Visit Added or deleted any medications: No Patient Arrived: Walker Any new allergies or adverse reactions: No Arrival Time: 15:11 Had [name] fall or experienced change in No Accompanied By: self activities of daily living that may affect Transfer Assistance: None risk of falls: Patient Identification Verified: Yes Signs or symptoms of abuse/neglect since last visito No Secondary Verification Process Completed: Yes Hospitalized since last visit: No Patient Requires Transmission-Based Precautions: No Implantable device outside of the clinic excluding No Patient Has Alerts: Yes cellular tissue based products placed in the center Patient Alerts: ABI R 0.68 (07/15/23) since last visit: ABI L 1.13 (07/15/23) Has Dressing in Place as Prescribed: Yes Pain Present Now: No Electronic Signature(s) Signed: 09/22/2023 4:58:03 PM By: Zenaida Deed RN, BSN Entered By: Zenaida Deed on 09/22/2023 15:27:39 -------------------------------------------------------------------------------- Encounter Discharge Information Details Patient Name: Date of Service: [name] [name], [name] [name]. 09/22/2023 1:15 PM Medical Record Number: [id number] Patient Account Number: [id number] Date of Birth/Sex: Treating RN: October 09, 1950 (73 y.o. Damaris Schooner Primary Care Leonor Darnell: Doreen Beam Other Clinician: Referring Ceylin Dreibelbis: Treating Robbye Dede/Extender: Teofilo Pod Weeks in Treatment: 8 Encounter  Discharge Information Items Post Procedure Vitals Discharge Condition: Stable Temperature (F): 97.4 Ambulatory Status: Walker Pulse (bpm): 74 Discharge Destination: Home Respiratory Rate (breaths/min): 18 Transportation: Other Blood Pressure (mmHg): 175/80 Accompanied By: self Schedule Follow-up Appointment: Yes Clinical Summary of Care: Patient Declined Notes transportation service Electronic Signature(s) Signed: 09/22/2023 4:58:03 PM By: Zenaida Deed RN, BSN Entered By: Zenaida Deed on 09/22/2023 16:04:58 [name] [name], [name] [name] ([id number]) [id number] Page 2 of 10 -------------------------------------------------------------------------------- Lower Extremity Assessment Details Patient Name: Date of Service: [name] [name], [name] [name] 09/22/2023 1:15 PM Medical Record Number: [id number] Patient Account Number: [id number] Date of Birth/Sex: Treating RN: 1951/01/03 (73 y.o. Damaris Schooner Primary Care Lamberto Dinapoli: Doreen Beam Other Clinician: Referring Jasalyn Frysinger: Treating Santasia Rew/Extender: Teofilo Pod Weeks in Treatment: 8 Edema Assessment Assessed: [Left: No] [Right: No] Edema: [Left: Ye] [Right: s] Calf Left: Right: Point of Measurement: From Medial Instep 39 cm Ankle Left: Right: Point of Measurement: From Medial Instep 30 cm Vascular Assessment Pulses: Dorsalis Pedis Palpable: [Left:Yes] Extremity colors, hair growth, and conditions: Extremity Color: [Left:Normal] Hair Growth on Extremity: [Left:No] Temperature of Extremity: [Left:Warm < 3 seconds] Electronic Signature(s) Signed: 09/22/2023 4:58:03 PM By: Zenaida Deed RN, BSN Entered By: Zenaida Deed on 09/22/2023 15:29:29 -------------------------------------------------------------------------------- Multi Wound Chart Details Patient Name: Date of Service: [name] [name], [name] [name]. 09/22/2023 1:15 PM Medical Record Number: [id number] Patient Account Number: [id number] Date of  Birth/Sex: Treating RN: [birth year] (73 y.o. M) Primary Care Clemma Johnsen: Doreen Beam Other Clinician: Referring Genevieve Arbaugh: Treating Rakhi Romagnoli/Extender: Vonzella Nipple, Dhruv Weeks in Treatment: 8 Vital Signs Height(in): 68 Capillary Blood Glucose(mg/dl): 485 Weight(lbs): 462 Pulse(bpm): 74 Body Mass Index(BMI): 30.1 Blood Pressure(mmHg): 175/80 Temperature(F): 97.5 Respiratory Rate(breaths/min): 18 [1:Photos:] [[id number] Page 3 of 10] Left, Dorsal Foot Left Amputation Site - Toe Left T Great oe Wound Location: Gradually Appeared Surgical Injury Gradually Appeared Wounding Event: Diabetic Wound/Ulcer of the Lower Diabetic Wound/Ulcer of the Lower Diabetic Wound/Ulcer of the Lower Primary Etiology: Extremity Extremity Extremity N/[name] Arterial Insufficiency Ulcer  Secondary Etiology: Anemia, Hypertension, Peripheral Anemia, Hypertension, Peripheral Anemia, Hypertension, Peripheral Comorbid History: Arterial Disease, Type II Diabetes, Arterial Disease, Type II Diabetes, Arterial Disease, Type II Diabetes, Neuropathy, Confinement Anxiety Neuropathy, Confinement Anxiety Neuropathy, Confinement Anxiety 06/02/2023 06/02/2023 06/07/2023 Date Acquired: 8 8 8  Weeks of Treatment: Healed - Epithelialized Open Open Wound Status: No No No Wound Recurrence: 0x0x0 3.9x5.1x0.3 1.5x3.8x0.2 Measurements L x W x D (cm) 0 15.622 4.477 [name] (cm) : rea 0 4.686 0.895 Volume (cm) : 100.00% -59.10% 74.70% % Reduction in [name] rea: 100.00% 4.50% 49.30% % Reduction in Volume: Grade 2 Grade 3 Grade 4 Classification: None Present Medium Medium Exudate [name] mount: N/[name] Serosanguineous Serosanguineous Exudate Type: N/[name] red, brown red, brown Exudate Color: N/[name] Flat and Intact Flat and Intact Wound Margin: None Present (0%) Medium (34-66%) Small (1-33%) Granulation [name] mount: N/[name] Red Pink Granulation Quality: None Present (0%) Medium (34-66%) Large (67-100%) Necrotic [name]  mount: Fascia: No Fat Layer (Subcutaneous Tissue): Yes Fat Layer (Subcutaneous Tissue): Yes Exposed Structures: Fat Layer (Subcutaneous Tissue): No Tendon: Yes Fascia: No Tendon: No Fascia: No Tendon: No Muscle: No Muscle: No Muscle: No Joint: No Joint: No Joint: No Bone: No Bone: No Bone: No Large (67-100%) Small (1-33%) Small (1-33%) Epithelialization: No Abnormalities Noted No Abnormalities Noted No Abnormalities Noted Periwound Skin Texture: Dry/Scaly: Yes Dry/Scaly: No Dry/Scaly: Yes Periwound Skin Moisture: No Abnormalities Noted No Abnormalities Noted No Abnormalities Noted Periwound Skin Color: No Abnormality No Abnormality No Abnormality Temperature: N/[name] Yes Yes Tenderness on Palpation: Treatment Notes Electronic Signature(s) Signed: 09/22/2023 3:34:09 PM By: Duanne Guess MD FACS Entered By: Duanne Guess on 09/22/2023 15:34:09 -------------------------------------------------------------------------------- Multi-Disciplinary Care Plan Details Patient Name: Date of Service: [name] [name], Julius [name]. 09/22/2023 1:15 PM Medical Record Number: [id number] Patient Account Number: [id number] Date of Birth/Sex: Treating RN: [birth year] (73 y.o. 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 to knowledge deficit of hyperbaric oxygen therapy and treatment procedures Discomfort related to temperature and humidity changes inside hyperbaric chamber Mcadoo, Zion [name] ([id number]) [id number] Page 4 of 10 Potential for barotraumas to ears, sinuses, teeth, and lungs or cerebral gas embolism related to changes in atmospheric pressure  inside hyperbaric oxygen chamber Potential for oxygen toxicity seizures related to delivery of 100% oxygen at an increased atmospheric pressure Potential for pulmonary oxygen toxicity related to delivery of 100% oxygen at an increased atmospheric pressure Goals: Barotrauma will be prevented during HBO2 Date Initiated: 07/22/2023 T arget Resolution Date: 11/04/2023 Goal Status: Active Patient and/or family will be able to state/discuss factors appropriate to the management of their disease process during treatment Date Initiated: 07/22/2023 T arget Resolution Date: 11/04/2023 Goal Status: Active Patient will tolerate the hyperbaric oxygen therapy treatment Date Initiated: 07/22/2023 T arget Resolution Date: 11/04/2023 Goal Status: Active Patient will tolerate the internal climate of the chamber Date Initiated: 07/22/2023 T arget Resolution Date: 11/04/2023 Goal Status: Active Patient/caregiver will verbalize understanding of HBO goals, rationale, procedures and potential hazards Date Initiated: 07/22/2023 T arget Resolution Date: 11/04/2023 Goal Status: Active Signs and symptoms of pulmonary oxygen toxicity will be recognized and promptly addressed Date Initiated: 07/22/2023 T arget Resolution Date: 11/04/2023 Goal Status: Active Interventions: Administer [name] five (5) minute air break for patient if signs and symptoms of seizure appear and notify the hyperbaric physician  Administer decongestants, per physician orders, prior to HBO2 Administer the correct therapeutic gas delivery based on the patients needs and limitations, per physician order Assess and provide for patients comfort related to the hyperbaric environment and equalization of middle ear Assess for signs and symptoms related to adverse events, including but not limited to confinement anxiety, pneumothorax, oxygen toxicity and baurotrauma Assess patient for any history of confinement anxiety Assess patient's knowledge and  expectations regarding hyperbaric medicine and provide education related to the hyperbaric environment, goals of treatment and prevention of adverse events Implement protocols to decrease risk of pneumothorax in high risk patients Notes: Necrotic Tissue Nursing Diagnoses: Impaired tissue integrity related to necrotic/devitalized tissue Knowledge deficit related to management of necrotic/devitalized tissue Goals: Necrotic/devitalized tissue will be minimized in the wound bed Date Initiated: 07/22/2023 Target Resolution Date: 11/04/2023 Goal Status: Active Patient/caregiver will verbalize understanding of reason and process for debridement of necrotic tissue Date Initiated: 07/22/2023 Target Resolution Date: 11/04/2023 Goal Status: Active Interventions: Assess patient pain level pre-, during and post procedure and prior to discharge Provide education on necrotic tissue and debridement process Treatment Activities: Apply topical anesthetic as ordered : 07/22/2023 Notes: Wound/Skin Impairment Nursing Diagnoses: Impaired tissue integrity Knowledge deficit related to ulceration/compromised skin integrity Goals: Patient/caregiver will verbalize understanding of skin care regimen Date Initiated: 07/22/2023 Target Resolution Date: 11/04/2023 Goal Status: Active Interventions: Assess ulceration(s) [name] [name] visit Treatment Activities: Skin care regimen initiated : 07/22/2023 [name] [name], Casimer [name] ([id number]) [id number] Page 5 of 10 Topical wound management initiated : 07/22/2023 Notes: Electronic Signature(s) Signed: 09/22/2023 4:58:03 PM By: Zenaida Deed RN, BSN Entered By: Zenaida Deed on 09/22/2023 15:40:24 -------------------------------------------------------------------------------- Pain Assessment Details Patient Name: Date of Service: Marrian Salvage [name]. 09/22/2023 1:15 PM Medical Record Number: [id number] Patient Account Number: [id number] Date of  Birth/Sex: Treating RN: April 08, 1951 (73 y.o. M) Primary Care Nik Gorrell: Doreen Beam Other Clinician: Referring Geneive Sandstrom: Treating Norissa Bartee/Extender: Vonzella Nipple, Dhruv Weeks in Treatment: 8 Active Problems Location of Pain Severity and Description of Pain Patient Has Paino No Site Locations Rate the pain. Current Pain Level: 0 Pain Management and Medication Current Pain Management: Electronic Signature(s) Signed: 09/22/2023 4:58:03 PM By: Zenaida Deed RN, BSN Entered By: Zenaida Deed on 09/22/2023 15:27:57 -------------------------------------------------------------------------------- Patient/Caregiver Education Details Patient Name: Date of Service: [name] [name] 1/16/2025andnbsp1:15 PM Medical Record Number: [id number] Patient Account Number: [id number] Date of Birth/Gender: Treating RN: August 10, 1951 (72 y.o. Damaris Schooner Primary Care Physician: Doreen Beam Other Clinician: Referring Physician: Treating Physician/Extender: Teofilo Pod Weeks in Treatment: 8 Education Assessment [name] [name], [name] [name] ([id number]) [id number] Page 6 of 10 Education Provided To: Patient Education Topics Provided Hyperbaric Oxygenation: Methods: Explain/Verbal Responses: Reinforcements needed, State content correctly Wound Debridement: Methods: Explain/Verbal Responses: Reinforcements needed, State content correctly Wound/Skin Impairment: Methods: Explain/Verbal Responses: Reinforcements needed, State content correctly Electronic Signature(s) Signed: 09/22/2023 4:58:03 PM By: Zenaida Deed RN, BSN Entered By: Zenaida Deed on 09/22/2023 15:42:50 -------------------------------------------------------------------------------- Wound Assessment Details Patient Name: Date of Service: [name] [name], [name] [name]. 09/22/2023 1:15 PM Medical Record Number: [id number] Patient Account Number: [id number] Date of Birth/Sex: Treating RN: 16-Aug-1951 (73  y.o. M) Primary Care Tyeasha Ebbs: Doreen Beam Other Clinician: Referring Silvana Holecek: Treating Citlalic Norlander/Extender: Vonzella Nipple, Dhruv Weeks in Treatment: 8 Wound Status Wound Number: 1 Primary Diabetic Wound/Ulcer of the Lower Extremity Etiology: Wound Location: Left, Dorsal Foot Wound Healed - Epithelialized Wounding Event: Gradually Appeared Status: Date Acquired: 06/02/2023 Comorbid Anemia, Hypertension, Peripheral Arterial Disease, Type II Weeks Of Treatment: 8 History: Diabetes, Neuropathy, Confinement Anxiety  Clustered Wound: No Photos Wound Measurements Length: (cm) Width: (cm) Depth: (cm) Area: (cm) Volume: (cm) 0 % Reduction in Area: 100% 0 % Reduction in Volume: 100% 0 Epithelialization: Large (67-100%) 0 Tunneling: No 0 Undermining: No Wound Description Classification: Grade 2 Exudate Amount: None Present Lisowski, Christoher [name] ([id number]) Foul Odor After Cleansing: No Slough/Fibrino No [id number] Page 7 of 10 Wound Bed Granulation Amount: None Present (0%) Exposed Structure Necrotic Amount: None Present (0%) Fascia Exposed: No Fat Layer (Subcutaneous Tissue) Exposed: No Tendon Exposed: No Muscle Exposed: No Joint Exposed: No Bone Exposed: No Periwound Skin Texture Texture Color No Abnormalities Noted: Yes No Abnormalities Noted: Yes Moisture Temperature / Pain No Abnormalities Noted: No Temperature: No Abnormality Dry / Scaly: Yes Electronic Signature(s) Signed: 09/22/2023 4:58:03 PM By: Zenaida Deed RN, BSN Entered By: Zenaida Deed on 09/22/2023 15:31:24 -------------------------------------------------------------------------------- Wound Assessment Details Patient Name: Date of Service: [name] [name], [name] [name]. 09/22/2023 1:15 PM Medical Record Number: [id number] Patient Account Number: [id number] Date of Birth/Sex: Treating RN: 26-Jan-1951 (73 y.o. M) Primary Care Denene Alamillo: Doreen Beam Other Clinician: Referring  Cederick Broadnax: Treating Briyonna Omara/Extender: Vonzella Nipple, Dhruv Weeks in Treatment: 8 Wound Status Wound Number: 2 Primary Diabetic Wound/Ulcer of the Lower Extremity Etiology: Wound Location: Left Amputation Site - Toe Wound Open Wounding Event: Surgical Injury Status: Date Acquired: 06/02/2023 Comorbid Anemia, Hypertension, Peripheral Arterial Disease, Type II Weeks Of Treatment: 8 History: Diabetes, Neuropathy, Confinement Anxiety Clustered Wound: No Photos Wound Measurements Length: (cm) 3.9 Width: (cm) 5.1 Depth: (cm) 0.3 Area: (cm) 15.622 Volume: (cm) 4.686 % Reduction in Area: -59.1% % Reduction in Volume: 4.5% Epithelialization: Small (1-33%) Tunneling: No Undermining: No Wound Description Classification: Grade 3 Wound Margin: Flat and Intact Exudate Amount: Medium Exudate Type: Serosanguineous Exudate Color: red, brown Roh, Doris [name] ([id number]) Foul Odor After Cleansing: No Slough/Fibrino Yes [id number] Page 8 of 10 Wound Bed Granulation Amount: Medium (34-66%) Exposed Structure Granulation Quality: Red Fascia Exposed: No Necrotic Amount: Medium (34-66%) Fat Layer (Subcutaneous Tissue) Exposed: Yes Necrotic Quality: Adherent Slough Tendon Exposed: Yes Muscle Exposed: No Joint Exposed: No Bone Exposed: No Periwound Skin Texture Texture Color No Abnormalities Noted: Yes No Abnormalities Noted: Yes Moisture Temperature / Pain No Abnormalities Noted: No Temperature: No Abnormality Dry / Scaly: No Tenderness on Palpation: Yes Treatment Notes Wound #2 (Amputation Site - Toe) Wound Laterality: Left Cleanser Soap and Water Discharge Instruction: May shower and wash wound with dial antibacterial soap and water prior to dressing change. Wound Cleanser Discharge Instruction: Cleanse the wound with wound cleanser prior to applying [name] clean dressing using gauze sponges, not tissue or cotton balls. Peri-Wound  Care Topical Primary Dressing MediHoney Gel, tube 1.5 (oz) Discharge Instruction: IF SANTYL NOT AVAILABLE - Apply to wound bed as instructed Santyl Ointment Discharge Instruction: Apply nickel thick amount to wound bed as instructed Secondary Dressing ABD Pad, 5x9 Discharge Instruction: Apply over primary dressing as directed. Woven Gauze Sponge, Non-Sterile 4x4 in Discharge Instruction: Moisten gauze with saline and apply over primary dressing as directed. Secured With Elastic Bandage 4 inch (ACE bandage) Discharge Instruction: Secure with ACE bandage as directed. Kerlix Roll Sterile, 4.5x3.1 (in/yd) Discharge Instruction: Secure with Kerlix as directed. Transpore Surgical Tape, 2x10 (in/yd) Discharge Instruction: Secure dressing with tape as directed. Compression Wrap Compression Stockings Add-Ons Electronic Signature(s) Signed: 09/22/2023 4:58:03 PM By: Zenaida Deed RN, BSN Entered By: Zenaida Deed on 09/22/2023 15:33:16 -------------------------------------------------------------------------------- Wound Assessment Details Patient Name: Date of Service: [name] [name], [name] [name]. 09/22/2023 1:15 PM Medical Record Number: [id number] Patient  Account Number: [id number] Date of Birth/Sex: Treating RN: 1950-09-22 (73 y.o. M) [name] [name], [name] [name] ([id number]) [id number] Page 9 of 10 Primary Care Aariya Ferrick: Doreen Beam Other Clinician: Referring Sevon Rotert: Treating Shawnique Mariotti/Extender: Vonzella Nipple, Dhruv Weeks in Treatment: 8 Wound Status Wound Number: 3 Primary Diabetic Wound/Ulcer of the Lower Extremity Etiology: Wound Location: Left T Great oe Secondary Arterial Insufficiency Ulcer Wounding Event: Gradually Appeared Etiology: Date Acquired: 06/07/2023 Wound Open Weeks Of Treatment: 8 Status: Clustered Wound: No Comorbid Anemia, Hypertension, Peripheral Arterial Disease, Type II History: Diabetes, Neuropathy, Confinement Anxiety Photos Wound  Measurements Length: (cm) 1.5 Width: (cm) 3.8 Depth: (cm) 0.2 Area: (cm) 4.477 Volume: (cm) 0.895 % Reduction in Area: 74.7% % Reduction in Volume: 49.3% Epithelialization: Small (1-33%) Tunneling: No Undermining: No Wound Description Classification: Grade 4 Wound Margin: Flat and Intact Exudate Amount: Medium Exudate Type: Serosanguineous Exudate Color: red, brown Foul Odor After Cleansing: No Slough/Fibrino Yes Wound Bed Granulation Amount: Small (1-33%) Exposed Structure Granulation Quality: Pink Fascia Exposed: No Necrotic Amount: Large (67-100%) Fat Layer (Subcutaneous Tissue) Exposed: Yes Necrotic Quality: Adherent Slough Tendon Exposed: No Muscle Exposed: No Joint Exposed: No Bone Exposed: No Periwound Skin Texture Texture Color No Abnormalities Noted: Yes No Abnormalities Noted: Yes Moisture Temperature / Pain No Abnormalities Noted: Yes Temperature: No Abnormality Tenderness on Palpation: Yes Treatment Notes Wound #3 (Toe Great) Wound Laterality: Left Cleanser Soap and Water Discharge Instruction: May shower and wash wound with dial antibacterial soap and water prior to dressing change. Wound Cleanser Discharge Instruction: Cleanse the wound with wound cleanser prior to applying [name] clean dressing using gauze sponges, not tissue or cotton balls. Peri-Wound Care Topical Primary Dressing MediHoney Gel, tube 1.5 (oz) Kiernan, Elijiah [name] ([id number]) [id number] Page 10 of 10 Discharge Instruction: IF SANTYL NOT AVAILABLE - Apply to wound bed as instructed Santyl Ointment Discharge Instruction: Apply nickel thick amount to wound bed as instructed Secondary Dressing ABD Pad, 5x9 Discharge Instruction: Apply over primary dressing as directed. Woven Gauze Sponge, Non-Sterile 4x4 in Discharge Instruction: Moisten gauze with saline and apply over primary dressing as directed. Secured With Elastic Bandage 4 inch (ACE bandage) Discharge  Instruction: Secure with ACE bandage as directed. Kerlix Roll Sterile, 4.5x3.1 (in/yd) Discharge Instruction: Secure with Kerlix as directed. Transpore Surgical Tape, 2x10 (in/yd) Discharge Instruction: Secure dressing with tape as directed. Compression Wrap Compression Stockings Add-Ons Electronic Signature(s) Signed: 09/22/2023 4:58:03 PM By: Zenaida Deed RN, BSN Entered By: Zenaida Deed on 09/22/2023 15:33:51 -------------------------------------------------------------------------------- Vitals Details Patient Name: Date of Service: [name] [name], [name] [name]. 09/22/2023 1:15 PM Medical Record Number: [id number] Patient Account Number: [id number] Date of Birth/Sex: Treating RN: [birth year] (73 y.o. M) Primary Care Slade Pierpoint: Doreen Beam Other Clinician: Referring Lore Polka: Treating Maddux Vanscyoc/Extender: Vonzella Nipple, Dhruv Weeks in Treatment: 8 Vital Signs Time Taken: 03:12 Temperature (F): 97.5 Height (in): 68 Pulse (bpm): 74 Weight (lbs): 198 Respiratory Rate (breaths/min): 18 Body Mass Index (BMI): 30.1 Blood Pressure (mmHg): 175/80 Capillary Blood Glucose (mg/dl): 854 Reference Range: 80 - 120 mg / dl Electronic Signature(s) Signed: 09/22/2023 4:36:47 PM By: Dayton Scrape Entered By: Dayton Scrape on 09/22/2023 15:12:25

## 2023-09-22 NOTE — Progress Notes (Signed)
Pinheiro, Shamarion A ([id number]) [phone].pdf Page 1 of 2 Visit Report for 09/22/2023 Arrival Information Details Patient Name: Date of Service: [name] [name], [name] [name] 09/22/2023 11:15 A M Medical Record Number: [id number] Patient Account Number: [id number] Date of Birth/Sex: Treating RN: 1951/03/14 (73 y.o. Harlon Flor, Millard.Loa Primary Care Unnamed Hino: Doreen Beam Other Clinician: Haywood Pao Referring Sharise Lippy: Treating Teigan Manner/Extender: Teofilo Pod Weeks in Treatment: 8 Visit Information History Since Last Visit All ordered tests and consults were completed: Yes Patient Arrived: Dan Humphreys Added or deleted any medications: No Arrival Time: 11:22 Any new allergies or adverse reactions: No Accompanied By: self Had a fall or experienced change in No Transfer Assistance: None activities of daily living that may affect Patient Identification Verified: Yes risk of falls: Secondary Verification Process Completed: Yes Signs or symptoms of abuse/neglect since last visito No Patient Requires Transmission-Based Precautions: No Hospitalized since last visit: No Patient Has Alerts: Yes Implantable device outside of the clinic excluding No Patient Alerts: ABI R 0.68 (07/15/23) cellular tissue based products placed in the center ABI L 1.13 (07/15/23) since last visit: Pain Present Now: No Electronic Signature(s) Signed: 09/22/2023 2:33:29 PM By: Haywood Pao CHT EMT BS , , Entered By: Haywood Pao on 09/22/2023 14:33:29 -------------------------------------------------------------------------------- Encounter Discharge Information Details Patient Name: Date of Service: [name] [name], [name] A. 09/22/2023 11:15 A M Medical Record Number: [id number] Patient Account Number: [id number] Date of Birth/Sex: Treating RN: 1951-04-04 (73 y.o. Tammy Sours Primary Care Paeton Latouche: Doreen Beam Other Clinician: Haywood Pao Referring Lynsee Wands: Treating  Arianna Delsanto/Extender: Teofilo Pod Weeks in Treatment: 8 Encounter Discharge Information Items Discharge Condition: Stable Ambulatory Status: Walker Discharge Destination: Other (Note Required) Transportation: Other Accompanied By: staff Schedule Follow-up Appointment: No Clinical Summary of Care: Electronic Signature(s) Signed: 09/22/2023 3:38:06 PM By: Haywood Pao CHT EMT BS , , Entered By: Haywood Pao on 09/22/2023 15:38:06 [name] [name], [name] A ([id number]) [id number].pdf Page 2 of 2 -------------------------------------------------------------------------------- Vitals Details Patient Name: Date of Service: [name] [name], [name] [name] 09/22/2023 11:15 A M Medical Record Number: [id number] Patient Account Number: [id number] Date of Birth/Sex: Treating RN: [birth year] (73 y.o. Tammy Sours Primary Care Ramsie Ostrander: Doreen Beam Other Clinician: Haywood Pao Referring Yen Wandell: Treating Rayneisha Bouza/Extender: Teofilo Pod Weeks in Treatment: 8 Vital Signs Time Taken: 11:27 Temperature (F): 98.6 Height (in): 68 Pulse (bpm): 81 Weight (lbs): 198 Respiratory Rate (breaths/min): 18 Body Mass Index (BMI): 30.1 Blood Pressure (mmHg): 135/81 Capillary Blood Glucose (mg/dl): 220 Reference Range: 80 - 120 mg / dl Electronic Signature(s) Signed: 09/22/2023 2:33:53 PM By: Haywood Pao CHT EMT BS , , Entered By: Haywood Pao on 09/22/2023 14:33:53

## 2023-09-22 NOTE — Progress Notes (Addendum)
[name], Jaja [name] ([id number]) [id number] Page 1 of 12 Visit Report for 09/22/2023 Chief Complaint Document Details Patient Name: Date of Service: [name] [name], [name] [name] 09/22/2023 1:15 PM Medical Record Number: [id number] Patient Account Number: [id number] Date of Birth/Sex: Treating RN: February 19, 1951 (73 y.o. M) Primary Care Provider: Doreen Beam Other Clinician: Referring Provider: Treating Provider/Extender: Vonzella Nipple, Dhruv Weeks in Treatment: 8 Information Obtained from: Patient Chief Complaint Patients presents for treatment of open diabetic ulcers in the setting of severe peripheral vascular disease Electronic Signature(s) Signed: 09/22/2023 3:34:18 PM By: Duanne Guess MD FACS Entered By: Duanne Guess on 09/22/2023 15:34:17 -------------------------------------------------------------------------------- Debridement Details Patient Name: Date of Service: [name] [name], Milen [name]. 09/22/2023 1:15 PM Medical Record Number: [id number] Patient Account Number: [id number] Date of Birth/Sex: Treating RN: 06/08/1951 (73 y.o. Damaris Schooner Primary Care Provider: Doreen Beam Other Clinician: Referring Provider: Treating Provider/Extender: Teofilo Pod Weeks in Treatment: 8 Debridement Performed for Assessment: Wound #2 Left Amputation Site - Toe Performed By: Physician Duanne Guess, MD The following information was scribed by: Zenaida Deed The information was scribed for: Duanne Guess Debridement Type: Debridement Severity of Tissue Pre Debridement: Necrosis of muscle Level of Consciousness (Pre-procedure): Awake and Alert Pre-procedure Verification/Time Out Yes - 15:40 Taken: Start Time: 15:40 Pain Control: Lidocaine 4% T opical Solution Percent of Wound Bed Debrided: 100% T Area Debrided (cm): otal 15.61 Tissue and other material debrided: Viable, Non-Viable, Slough, Subcutaneous, Tendon, Skin: Epidermis,  Slough Level: Skin/Subcutaneous Tissue/Muscle Debridement Description: Excisional Instrument: Curette, Forceps, Scissors Bleeding: Minimum Hemostasis Achieved: Pressure Procedural Pain: 3 Post Procedural Pain: 1 Response to Treatment: Procedure was tolerated well Level of Consciousness (Post- Awake and Alert procedure): Post Debridement Measurements of Total Wound Length: (cm) 3.9 Width: (cm) 5.1 Depth: (cm) 0.3 Volume: (cm) 4.686 Whitby, Deven [name] ([id number]) [id number] Page 2 of 12 Character of Wound/Ulcer Post Debridement: Improved Severity of Tissue Post Debridement: Necrosis of muscle Post Procedure Diagnosis Same as Pre-procedure Electronic Signature(s) Signed: 09/22/2023 4:19:29 PM By: Duanne Guess MD FACS Signed: 09/22/2023 4:58:03 PM By: Zenaida Deed RN, BSN Entered By: Zenaida Deed on 09/22/2023 15:45:20 -------------------------------------------------------------------------------- Debridement Details Patient Name: Date of Service: [name] [name], [name] [name]. 09/22/2023 1:15 PM Medical Record Number: [id number] Patient Account Number: [id number] Date of Birth/Sex: Treating RN: 02-Nov-1950 (73 y.o. Damaris Schooner Primary Care Provider: Doreen Beam Other Clinician: Referring Provider: Treating Provider/Extender: Teofilo Pod Weeks in Treatment: 8 Debridement Performed for Assessment: Wound #3 Left T Great oe Performed By: Physician Duanne Guess, MD The following information was scribed by: Zenaida Deed The information was scribed for: Duanne Guess Debridement Type: Debridement Severity of Tissue Pre Debridement: Fat layer exposed Level of Consciousness (Pre-procedure): Awake and Alert Pre-procedure Verification/Time Out Yes - 15:40 Taken: Start Time: 15:40 Pain Control: Lidocaine 4% T opical Solution Percent of Wound Bed Debrided: 100% T Area Debrided (cm): otal 4.47 Tissue and other material  debrided: Viable, Non-Viable, Slough, Subcutaneous, Skin: Epidermis, Slough Level: Skin/Subcutaneous Tissue Debridement Description: Excisional Instrument: Curette, Forceps, Scissors Bleeding: Minimum Hemostasis Achieved: Pressure Procedural Pain: 3 Post Procedural Pain: 1 Response to Treatment: Procedure was tolerated well Level of Consciousness (Post- Awake and Alert procedure): Post Debridement Measurements of Total Wound Length: (cm) 1.5 Width: (cm) 3.8 Depth: (cm) 0.2 Volume: (cm) 0.895 Character of Wound/Ulcer Post Debridement: Improved Severity of Tissue Post Debridement: Fat layer exposed Post Procedure Diagnosis Same as Pre-procedure Electronic Signature(s) Signed: 09/22/2023 4:19:29 PM By: Duanne Guess MD FACS Signed: 09/22/2023 4:58:03 PM By: Zenaida Deed RN,  BSN Entered By: Zenaida Deed on 09/22/2023 15:46:19 Signer, Tymar [name] ([id number]) [id number] Page 3 of 12 -------------------------------------------------------------------------------- HPI Details Patient Name: Date of Service: [name], [name] 09/22/2023 1:15 PM Medical Record Number: [id number] Patient Account Number: [id number] Date of Birth/Sex: Treating RN: 07-Oct-1950 (73 y.o. M) Primary Care Provider: Doreen Beam Other Clinician: Referring Provider: Treating Provider/Extender: Vonzella Nipple, Dhruv Weeks in Treatment: 8 History of Present Illness HPI Description: ADMISSION 07/22/2023 ***POST-REVASCULARIZATION ABI/DOPPLER:*** ABI: +--------+------------------+-----+----------+--------+ Right Rt Pressure (mmHg)IndexWaveform Comment  +--------+------------------+-----+----------+--------+ [id number]     +--------+------------------+-----+----------+--------+ PTA 91 0.64 monophasic  +--------+------------------+-----+----------+--------+ DP 97 0.68 monophasic   +--------+------------------+-----+----------+--------+ +--------+------------------+-----+----------+-------+ Left Lt Pressure (mmHg)IndexWaveform Comment +--------+------------------+-----+----------+-------+ [id number]     +--------+------------------+-----+----------+-------+ PTA 160 1.13 biphasic   +--------+------------------+-----+----------+-------+ DP 101 0.71 monophasic  +--------+------------------+-----+----------+-------+ LLE Duplex: +----------+--------+-----+---------------+--------+--------+ LEFT PSV cm/sRatioStenosis WaveformComments +----------+--------+-----+---------------+--------+--------+ CFA Distal204  50-74% stenosis   +----------+--------+-----+---------------+--------+--------+ SFA Prox 202  50-74% stenosis   +----------+--------+-----+---------------+--------+--------+ SFA Mid 152  30-49% stenosis   +----------+--------+-----+---------------+--------+--------+ Left Graft #1: mid SFA to posterior tibial +--------------------+--------+---------------+--------+----------------+  PSV cm/sStenosis WaveformComments  +--------------------+--------+---------------+--------+----------------+ Inflow 152     +--------------------+--------+---------------+--------+----------------+ Proximal Anastomosis360 >70% stenosis    +--------------------+--------+---------------+--------+----------------+ Proximal Graft 180 50-70% stenosis low end of range +--------------------+--------+---------------+--------+----------------+ Mid Graft 136     +--------------------+--------+---------------+--------+----------------+ Distal Graft 141     +--------------------+--------+---------------+--------+----------------+ Distal Anastomosis 183 50-70% stenosis   +--------------------+--------+---------------+--------+----------------+ Outflow 224 50-74% stenosis    +--------------------+--------+---------------+--------+----------------+ This is [name] 73 year old type II diabetic (last hemoglobin A1c 6.7% on June 07, 2023) with severe peripheral vascular occlusive disease. 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 Preoperative MRI did not demonstrate evidence of osteomyelitis. Unfortunately, ABIs were not performed prior to that operation and postoperatively, ABIs were done that showed severe bilateral lower extremity arterial occlusive disease with minimal perfusion to the left lower leg. He was transferred to the vascular surgery service at [facility]. An arteriogram was done that showed occlusion of the left common iliac artery. This was stented, but additional inflow was necessary to provide better blood flow to the foot. He subsequently underwent [name] femoral to tibial artery bypass with saphenous vein graft on October 8. 1 month post bypass arterial studies are copied above. He continued to receive wound care at [facility]. His last visit there was on 12 November. Their note describes the base of the ray amputation appearing viable with buds of granulation appearing. The surrounding digits appeared to be marginal with ischemic eschar formation. They have been doing Dakin's wet-to-dry dressings. He was referred to our center to be evaluated for hyperbaric oxygen therapy. 07/29/2023: The dorsal foot wound is essentially unchanged. I can no longer probe to bone at the amputation site. Tendon is exposed on the dorsal aspect of the great toe, but the black eschar has not reaccumulated. Santyl was cost prohibitive so they have been using Medihoney. We are working on getting him approved for hyperbaric oxygen therapy. 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 This was in October 2024. He was referred down to our center for nonhealing wounds to consider hyperbaric oxygen. Since the patient was last here on 11/22 he was admitted to hospital from 08/06/2019 4 through 12/3 to/24 he was found to have [name] GI bleed. His hemoglobin was slightly  over 4 on presentation he was transfused. 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 09/22/2023: It has been almost 2 months since I have seen this wound. He started hyperbaric oxygen therapy today; he was meant to start last week, but developed confinement anxiety while in the chamber and had to be brought out. His PCP has prescribed him an anxiolytic which he successfully used today to complete his treatment. The wound has quite [name] lot of good granulation tissue forming underneath [name] layer of slough. There is nonviable tendon present at the dorsal base and tip of the great toe. No malodor or purulent drainage. Electronic Signature(s) Signed: 09/22/2023 4:00:25 PM By: Duanne Guess MD FACS Entered By: Duanne Guess on 09/22/2023 16:00:25 -------------------------------------------------------------------------------- Physical Exam Details Patient Name: Date of Service: Marrian Salvage [name]. 09/22/2023 1:15 PM Medical Record Number: [id number] Patient Account Number: [id number] Date of Birth/Sex: Treating RN: 1950-10-01 (73 y.o. M) Primary Care Provider: Doreen Beam Other Clinician: Referring Provider: Treating Provider/Extender: Vonzella Nipple, Dhruv Weeks in Treatment: 8 Constitutional Hypertensive, asymptomatic. . . . no acute distress. Respiratory Normal work of breathing on room air.. Notes 09/22/2023: The wound has quite [name] lot of good granulation tissue forming underneath [name] layer of slough. There is nonviable tendon  present at the dorsal base and tip of the great toe. No malodor or purulent drainage. Electronic Signature(s) Signed: 09/22/2023 4:04:09 PM By: Duanne Guess MD FACS Entered By: Duanne Guess on 09/22/2023 16:04:09 -------------------------------------------------------------------------------- Physician Orders Details Patient Name: Date of Service: [name] [name], Waris [name]. 09/22/2023 1:15 PM Medical Record Number: [id number] Patient Account Number: [id number] Date of Birth/Sex: Treating RN: May 10, 1951 (73 y.o. 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 - Dr. Lady Gary Return [name] 1/27 @ 10:00 am prior to HBO Anesthetic (In clinic) Topical Lidocaine 4% applied to wound bed Bathing/ Shower/ Hygiene May shower and wash wound with soap and water. Edema Control - Orders / Instructions Elevate legs to the level of the heart or above for 30 minutes daily and/or when sitting for 3-4 times [name] day throughout the day. Avoid  standing for long periods of time. Additional Orders / Instructions Follow Nutritious Diet - 70-100 grams of protein [name] day - can supplement with protein shakes ensure/premier vitamin C start 500 mg once [name] day and if tolerated then take twice [name] day zinc 30-50 mg once [name] day Home Health No change in wound care orders this week; continue Home Health for wound care. May utilize formulary equivalent dressing for wound treatment orders unless otherwise specified. Dressing changes to be completed by Home Health on Monday / Wednesday / Friday except when patient has scheduled visit at  Wound Care Center. - ONLY USE MEDIHONEY IF SANYL IS NOT AVAILABLE Other Home Health Orders/Instructions: - [facility] O[facility] Therapy Evaluate for HBO Therapy Indication: - Wagner 4 diabetic foot ulcer If appropriate for treatment, begin HBOT per protocol: 2.5 ATA for 90 Minutes with 2 Five (5) Minute [name] Breaks ir Total Number of Treatments: - 40 One treatments per day (delivered Monday through Friday unless otherwise specified in Special Instructions below): Finger stick Blood Glucose Pre- and Post- HBOT Treatment. Follow Hyperbaric Oxygen Glycemia Protocol Give two 4oz orange juices in addition to Glucerna when the glycemic protocol is used. [name] frin (Oxymetazoline HCL) 0.05% nasal spray - 1 spray in both nostrils daily as needed prior to HBO treatment for difficulty clearing ears Wound Treatment Wound #2 - Amputation Site - Toe Wound Laterality: Left Cleanser: Soap and Water 1 x Per Day/30 Days Discharge Instructions: May shower and wash wound with dial antibacterial soap and water prior to dressing change. Cleanser: Wound Cleanser 1 x Per Day/30 Days Discharge Instructions: Cleanse the wound with wound cleanser prior to applying [name] clean dressing using gauze sponges, not tissue or cotton balls. Prim Dressing: MediHoney Gel, tube 1.5 (oz) 1 x Per Day/30 Days ary Discharge Instructions: IF SANTYL NOT  AVAILABLE - Apply to wound bed as instructed Prim Dressing: Santyl Ointment 1 x Per Day/30 Days ary Discharge Instructions: Apply nickel thick amount to wound bed as instructed Secondary Dressing: ABD Pad, 5x9 1 x Per Day/30 Days Discharge Instructions: Apply over primary dressing as directed. Secondary Dressing: Woven Gauze Sponge, Non-Sterile 4x4 in 1 x Per Day/30 Days Discharge Instructions: Moisten gauze with saline and apply over primary dressing as directed. Secured With: Elastic Bandage 4 inch (ACE bandage) 1 x Per Day/30 Days Discharge Instructions: Secure with ACE bandage as directed. Secured With: [employer], 4.5x3.1 (in/yd) 1 x Per Day/30 Days Discharge Instructions: Secure with Kerlix as directed. Secured With: Transpore Surgical Tape, 2x10 (in/yd) 1 x Per Day/30 Days Discharge Instructions: Secure dressing with tape as directed. Wound #3 - T Great oe Wound Laterality: Left Cleanser: Soap and Water 1 x Per Day/30 Days Discharge Instructions: May shower and wash wound with dial antibacterial soap and water prior to dressing change. Cleanser: Wound Cleanser 1 x Per Day/30 Days Discharge Instructions: Cleanse the wound with wound cleanser prior to applying [name] clean dressing using gauze sponges, not tissue or cotton balls. Geter, Jaxiel [name] ([id number]) [id number].pdf Page 6 of 12 Prim Dressing: MediHoney Gel, tube 1.5 (oz) 1 x Per Day/30 Days ary Discharge Instructions: IF SANTYL NOT AVAILABLE - Apply to wound bed as instructed Prim Dressing: Santyl Ointment 1 x Per Day/30 Days ary Discharge Instructions: Apply nickel thick amount to wound bed as instructed Secondary Dressing: ABD Pad, 5x9 1 x Per Day/30 Days Discharge Instructions: Apply over primary dressing as directed. Secondary Dressing: Woven Gauze Sponge, Non-Sterile 4x4 in 1 x Per Day/30 Days Discharge Instructions: Moisten gauze with saline and apply over primary dressing as  directed. Secured With: Elastic Bandage 4 inch (ACE bandage) 1 x Per Day/30 Days Discharge Instructions: Secure with ACE bandage as directed. Secured With: [employer], 4.5x3.1 (in/yd) 1 x Per Day/30 Days Discharge Instructions: Secure with Kerlix as directed. Secured With: Transpore Surgical Tape, 2x10 (in/yd) 1 x Per Day/30 Days Discharge Instructions: Secure dressing with tape as directed. GLYCEMIA INTERVENTIONS PROTOCOL PRE-HBO GLYCEMIA INTERVENTIONS ACTION INTERVENTION Obtain pre-HBO capillary blood glucose (ensure 1 physician order is in chart). [name]. Notify HBO physician and await physician  orders. 2 If result is 70 mg/dl or below: B. If the result meets the hospital definition of [name] critical result, follow hospital policy. [name]. Give patient an 8 ounce Glucerna Shake, an 8 ounce Ensure, or 8 ounces of [name] Glucerna/Ensure equivalent dietary supplement*. B. Wait 30 minutes. If result is 71 mg/dl to 332 mg/dl: C. Retest patients capillary blood glucose (CBG). D. If result greater than or equal to 110 mg/dl, proceed with HBO. If result less than 110 mg/dl, notify HBO physician and consider holding HBO. If result is 131 mg/dl to 951 mg/dl: [name]. Proceed with HBO. [name]. Notify HBO physician and await physician orders. B. It is recommended to hold HBO and do If result is 250 mg/dl or greater: blood/urine ketone testing. C. If the result meets the hospital definition of [name] critical result, follow hospital policy. POST-HBO GLYCEMIA INTERVENTIONS ACTION INTERVENTION Obtain post HBO capillary blood glucose (ensure 1 physician order is in chart). [name]. Notify HBO physician and await physician orders. 2 If result is 70 mg/dl or below: B. If the result meets the hospital definition of [name] critical result, follow hospital policy. [name]. Give patient an 8 ounce Glucerna Shake, an 8 ounce Ensure, or 8 ounces of [name] Glucerna/Ensure equivalent dietary supplement*. B. Wait 15 minutes for symptoms  of If result is 71 mg/dl to 884 mg/dl: hypoglycemia (i.e. nervousness, anxiety, sweating, chills, clamminess, irritability, confusion, tachycardia or dizziness). C. If patient asymptomatic, discharge patient. If patient symptomatic, repeat capillary blood glucose (CBG) and notify HBO physician. If result is 101 mg/dl to 166 mg/dl: [name]. Discharge patient. [name]. Notify HBO physician and await physician orders. B. It is recommended to do blood/urine ketone If result is 250 mg/dl or greater: testing. C. If the result meets the hospital definition of [name] critical result, follow hospital policy. *Juice or candies are NOT equivalent products. If patient refuses the Glucerna or Ensure, please consult the hospital dietitian for an appropriate substitute. Electronic Signature(s) Signed: 09/22/2023 4:19:29 PM By: Duanne Guess MD FACS Entered By: Duanne Guess on 09/22/2023 16:05:01 Noy, Merick [name] ([id number]) [id number] Page 7 of 12 -------------------------------------------------------------------------------- Problem List Details Patient Name: Date of Service: MELROY, SCHNACKENBERG 09/22/2023 1:15 PM Medical Record Number: [id number] Patient Account Number: [id number] Date of Birth/Sex: Treating RN: [birth year] (73 y.o. 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 07/22/2023 No Yes without evidence of necrosis L97.523 Non-pressure chronic ulcer of other part of left foot with necrosis of muscle 07/22/2023 No Yes L97.522 Non-pressure chronic ulcer of other part of left foot with fat layer exposed 07/22/2023 No Yes E11.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  07/22/2023 No Yes E11.621 Type 2 diabetes mellitus with foot ulcer 07/22/2023 No Yes I73.9 Peripheral vascular disease, unspecified 07/22/2023 No Yes I70.245 Atherosclerosis of native arteries of left leg with ulceration of other part of 07/22/2023 No Yes foot Inactive Problems Resolved Problems Electronic Signature(s) Signed: 09/22/2023 3:22:44 PM By: Duanne Guess MD FACS Entered By: Duanne Guess on 09/22/2023 15:22:44 -------------------------------------------------------------------------------- Progress Note Details Patient Name: Date of Service: [name] [name], Lemon [name]. 09/22/2023 1:15 PM Medical Record Number: [id number] Patient Account Number: [id number] Date of Birth/Sex: Treating RN: Jan 02, 1951 (73 y.o. M) Primary Care Provider: Doreen Beam Other Clinician: Suto, Tulio [name] ([id number]) [phone].pdf Page 8 of 12 Referring  Provider: Treating Provider/Extender: Vonzella Nipple, Dhruv Weeks in Treatment: 8 Subjective Chief Complaint Information obtained from Patient Patients presents for treatment of open diabetic ulcers in the setting of severe peripheral vascular disease History of Present Illness (HPI) ADMISSION 07/22/2023 ***POST-REVASCULARIZATION ABI/DOPPLER:*** ABI: +--------+------------------+-----+----------+--------+ Right Rt Pressure (mmHg)IndexWaveform Comment  +--------+------------------+-----+----------+--------+ [id number]     +--------+------------------+-----+----------+--------+ PTA 91 0.64 monophasic  +--------+------------------+-----+----------+--------+ DP 97 0.68 monophasic  +--------+------------------+-----+----------+--------+ +--------+------------------+-----+----------+-------+ Left Lt Pressure (mmHg)IndexWaveform Comment +--------+------------------+-----+----------+-------+ [id number]     +--------+------------------+-----+----------+-------+ PTA 160 1.13 biphasic    +--------+------------------+-----+----------+-------+ DP 101 0.71 monophasic  +--------+------------------+-----+----------+-------+ LLE Duplex: +----------+--------+-----+---------------+--------+--------+ LEFT PSV cm/sRatioStenosis WaveformComments +----------+--------+-----+---------------+--------+--------+ CFA Distal204  50-74% stenosis   +----------+--------+-----+---------------+--------+--------+ SFA Prox 202  50-74% stenosis   +----------+--------+-----+---------------+--------+--------+ SFA Mid 152  30-49% stenosis   +----------+--------+-----+---------------+--------+--------+ Left Graft #1: mid SFA to posterior tibial +--------------------+--------+---------------+--------+----------------+  PSV cm/sStenosis WaveformComments  +--------------------+--------+---------------+--------+----------------+ Inflow 152     +--------------------+--------+---------------+--------+----------------+ Proximal Anastomosis360 >70% stenosis    +--------------------+--------+---------------+--------+----------------+ Proximal Graft 180 50-70% stenosis low end of range +--------------------+--------+---------------+--------+----------------+ Mid Graft 136     +--------------------+--------+---------------+--------+----------------+ Distal Graft 141     +--------------------+--------+---------------+--------+----------------+ Distal Anastomosis 183 50-70% stenosis   +--------------------+--------+---------------+--------+----------------+ Outflow 224 50-74% stenosis   +--------------------+--------+---------------+--------+----------------+ This is [name] 73 year old type II diabetic (last hemoglobin A1c 6.7% on June 07, 2023) with severe peripheral vascular occlusive disease. 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 Preoperative MRI did not  demonstrate evidence of osteomyelitis. Unfortunately, ABIs were not performed prior to that operation and postoperatively, ABIs were done that showed severe bilateral lower extremity arterial occlusive disease with minimal perfusion to the left lower leg. He was transferred to the vascular surgery service at [facility]. An arteriogram was done that showed occlusion of the left common iliac artery. This was stented, but additional inflow was necessary to provide better blood flow to the foot. He subsequently underwent [name] femoral to tibial artery bypass with saphenous vein graft on October 8. 1 month post bypass arterial studies are copied above. He continued to receive wound care at [facility]. His last visit there was on 12 November. Their note describes the base of the ray amputation appearing viable with buds of granulation appearing. The surrounding digits appeared to be marginal with ischemic eschar formation. They have been doing Dakin's wet-to-dry dressings. He was referred to our center to be evaluated for hyperbaric oxygen therapy. 07/29/2023: The dorsal foot wound is essentially unchanged. I can no longer probe to bone at the amputation site. Tendon is exposed on the dorsal aspect of the great toe, but the black eschar has not reaccumulated. Santyl was cost prohibitive so they have been using Medihoney. We are working on getting him approved for hyperbaric oxygen therapy. 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 This was in October 2024. He was referred down to our center for nonhealing wounds to consider hyperbaric oxygen. Since the patient was last here on 11/22 he was admitted to hospital from 08/06/2019 4 through 12/3 to/24 he was found to have [name] GI bleed. His hemoglobin was slightly  over 4 on presentation he was transfused. 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 09/22/2023: It has been almost 2 months since I have seen this wound. He started hyperbaric oxygen therapy today; he was meant to start  last week, but Acres, Farrell [name] ([id number]) [id number].pdf Page 9 of 12 developed confinement anxiety while in the chamber and had to be brought out. His PCP has prescribed him an anxiolytic which he successfully used today to complete his treatment. The wound has quite [name] lot of good granulation tissue forming underneath [name] layer of slough. There is nonviable tendon present at the dorsal base and tip of the great toe. No malodor or purulent drainage. Objective Constitutional Hypertensive, asymptomatic. no acute distress. Vitals Time Taken: 3:12 AM, Height: 68 in, Weight: 198 lbs, BMI: 30.1, Temperature: 97.5 F, Pulse: 74 bpm, Respiratory Rate: 18 breaths/min, Blood Pressure: 175/80 mmHg, Capillary Blood Glucose: 113 mg/dl. Respiratory Normal work of breathing on room air.. General Notes: 09/22/2023: The wound has quite [name] lot of good granulation tissue forming underneath [name] layer of slough. There is nonviable tendon present at the dorsal base and tip of the great toe. No malodor or purulent drainage. Integumentary (Hair, Skin) Wound #1 status is Healed - Epithelialized. Original cause of wound was Gradually Appeared. The date acquired was: 06/02/2023. The wound has been in treatment 8 weeks. The wound is located on the Left,Dorsal Foot. The wound measures 0cm length x 0cm width x 0cm depth; 0cm^2 area and 0cm^3 volume. There is no tunneling or undermining noted. There is [name] none present amount of drainage noted. There is no granulation within the wound bed. There is no necrotic tissue within the wound bed. The periwound skin appearance had no abnormalities noted for texture. The periwound skin appearance had no abnormalities  noted for color. The periwound skin appearance exhibited: Dry/Scaly. Periwound temperature was noted as No Abnormality. Wound #2 status is Open. Original cause of wound was Surgical Injury. The date acquired was: 06/02/2023. The wound has been in treatment 8 weeks. The wound is located on the Left Amputation Site - T The wound measures 3.9cm length x 5.1cm width x 0.3cm depth; 15.622cm^2 area and 4.686cm^3 volume. oe. There is tendon and Fat Layer (Subcutaneous Tissue) exposed. There is no tunneling or undermining noted. There is [name] medium amount of serosanguineous drainage noted. The wound margin is flat and intact. There is medium (34-66%) red granulation within the wound bed. There is [name] medium (34-66%) amount of necrotic tissue within the wound bed including Adherent Slough. The periwound skin appearance had no abnormalities noted for texture. The periwound skin appearance had no abnormalities noted for color. The periwound skin appearance did not exhibit: Dry/Scaly. Periwound temperature was noted as No Abnormality. The periwound has tenderness on palpation. Wound #3 status is Open. Original cause of wound was Gradually Appeared. The date acquired was: 06/07/2023. The wound has been in treatment 8 weeks. The wound is located on the Left T Great. The wound measures 1.5cm length x 3.8cm width x 0.2cm depth; 4.477cm^2 area and 0.895cm^3 volume. There is Fat oe Layer (Subcutaneous Tissue) exposed. There is no tunneling or undermining noted. There is [name] medium amount of serosanguineous drainage noted. The wound margin is flat and intact. There is small (1-33%) pink granulation within the wound bed. There is [name] large (67-100%) amount of necrotic tissue within the wound bed including Adherent Slough. The periwound skin appearance had no abnormalities noted for texture. The periwound skin appearance had no abnormalities noted for moisture. The periwound skin appearance had no abnormalities noted for color.  Periwound temperature was noted as No Abnormality. The periwound has tenderness on palpation. 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 There was [name] Excisional Skin/Subcutaneous Tissue/Muscle Debridement with [name] total area of 15.61 sq cm performed by Duanne Guess, MD. With the following instrument(s): Curette, Forceps, and Scissors to remove Viable and Non-Viable tissue/material. Material removed includes Tendon, Subcutaneous Tissue, Slough, and Skin: Epidermis after achieving pain control using Lidocaine 4% T opical Solution. No specimens were taken. [name] time out was conducted at 15:40, prior to the start of the procedure. [name] Minimum amount of bleeding was controlled with Pressure. The procedure was tolerated well with [name] pain level of 3 throughout and [name] pain level of 1 following the procedure. Post Debridement Measurements: 3.9cm length x 5.1cm width x 0.3cm depth; 4.686cm^3 volume. Character of Wound/Ulcer Post Debridement is improved. Severity of Tissue Post Debridement is: Necrosis of muscle. Post procedure Diagnosis Wound #2: Same as Pre-Procedure Wound #3 Pre-procedure diagnosis of Wound #3 is [name] Diabetic Wound/Ulcer of the Lower Extremity located on the Left T Great .Severity of Tissue Pre  Debridement is: oe Gandolfo, Meryl [name] ([id number]) [id number].pdf Page 10 of 12 Fat layer exposed. There was [name] Excisional Skin/Subcutaneous Tissue Debridement with [name] total area of 4.47 sq cm performed by Duanne Guess, MD. With the following instrument(s): Curette, Forceps, and Scissors to remove Viable and Non-Viable tissue/material. Material removed includes Subcutaneous Tissue, Slough, and Skin: Epidermis after achieving pain control using Lidocaine 4% Topical Solution. No specimens were taken. [name] time out was conducted at 15:40, prior to the start of the procedure. [name] Minimum amount of bleeding was controlled with Pressure. The procedure was tolerated well with [name] pain level of 3 throughout and [name] pain level of 1 following the procedure. Post Debridement Measurements: 1.5cm length x 3.8cm width x 0.2cm depth; 0.895cm^3 volume. Character of Wound/Ulcer Post Debridement is improved. Severity of Tissue Post Debridement is: Fat layer exposed. Post procedure Diagnosis Wound #3: Same as Pre-Procedure Plan Follow-up Appointments: Return Appointment in 1 week. - Dr. Lady Gary 1/27 @ 10:00 am prior to HBO Anesthetic: (In clinic) Topical Lidocaine 4% applied to wound bed Bathing/ Shower/ Hygiene: May shower and wash wound with soap and water. Edema Control - Orders / Instructions: Elevate legs to the level of the heart or above for 30 minutes daily and/or when sitting for 3-4 times [name] day throughout the day. Avoid standing for long periods of time. Additional Orders / Instructions: Follow Nutritious Diet - 70-100 grams of protein [name] day - can supplement with protein shakes ensure/premier vitamin C start 500 mg once [name] day and if tolerated then take twice [name] day zinc 30-50 mg once [name] day Home Health: No change in wound care orders this week; continue Home Health for wound care. May utilize formulary equivalent dressing for wound treatment orders unless otherwise specified. Dressing  changes to be completed by Home Health on Monday / Wednesday / Friday except when patient has scheduled visit at [facility]. - ONLY USE MEDIHONEY IF [facility] IS  NOT AVAILABLE Other Home Health Orders/Instructions: - [facility] O[facility] Therapy: Evaluate for HBO Therapy Indication: - Wagner 4 diabetic foot ulcer If appropriate for treatment, begin HBOT per protocol: 2.5 ATA for 90 Minutes with 2 Five (5) Minute Air Breaks T Number of Treatments: - 40 otal One treatments per day (delivered Monday through Friday unless otherwise specified in Special Instructions below): Finger stick Blood Glucose Pre- and Post- HBOT Treatment. Follow Hyperbaric Oxygen Glycemia Protocol Give two 4oz orange juices in addition to Glucerna when the glycemic protocol is used. Afrin (Oxymetazoline HCL) 0.05% nasal spray - 1 spray in both nostrils daily as needed prior to HBO treatment for difficulty clearing ears WOUND #2: - Amputation Site - T oe Wound Laterality: Left Cleanser: Soap and Water 1 x Per Day/30 Days Discharge Instructions: May shower and wash wound with dial antibacterial soap and water prior to dressing change. Cleanser: Wound Cleanser 1 x Per Day/30 Days Discharge Instructions: Cleanse the wound with wound cleanser prior to applying [name] clean dressing using gauze sponges, not tissue or cotton balls. Prim Dressing: MediHoney Gel, tube 1.5 (oz) 1 x Per Day/30 Days ary Discharge Instructions: IF SANTYL NOT AVAILABLE - Apply to wound bed as instructed Prim Dressing: Santyl Ointment 1 x Per Day/30 Days ary Discharge Instructions: Apply nickel thick amount to wound bed as instructed Secondary Dressing: ABD Pad, 5x9 1 x Per Day/30 Days Discharge Instructions: Apply over primary dressing as directed. Secondary Dressing: Woven Gauze Sponge, Non-Sterile 4x4 in 1 x Per Day/30 Days Discharge Instructions: Moisten gauze with saline and apply over primary dressing as directed. Secured With: Elastic  Bandage 4 inch (ACE bandage) 1 x Per Day/30 Days Discharge Instructions: Secure with ACE bandage as directed. Secured With: [employer], 4.5x3.1 (in/yd) 1 x Per Day/30 Days Discharge Instructions: Secure with Kerlix as directed. Secured With: Transpore Surgical T ape, 2x10 (in/yd) 1 x Per Day/30 Days Discharge Instructions: Secure dressing with tape as directed. WOUND #3: - T Great Wound Laterality: Left oe Cleanser: Soap and Water 1 x Per Day/30 Days Discharge Instructions: May shower and wash wound with dial antibacterial soap and water prior to dressing change. Cleanser: Wound Cleanser 1 x Per Day/30 Days Discharge Instructions: Cleanse the wound with wound cleanser prior to applying [name] clean dressing using gauze sponges, not tissue or cotton balls. Prim Dressing: MediHoney Gel, tube 1.5 (oz) 1 x Per Day/30 Days ary Discharge Instructions: IF SANTYL NOT AVAILABLE - Apply to wound bed as instructed Prim Dressing: Santyl Ointment 1 x Per Day/30 Days ary Discharge Instructions: Apply nickel thick amount to wound bed as instructed Secondary Dressing: ABD Pad, 5x9 1 x Per Day/30 Days Discharge Instructions: Apply over primary dressing as directed. Secondary Dressing: Woven Gauze Sponge, Non-Sterile 4x4 in 1 x Per Day/30 Days Discharge Instructions: Moisten gauze with saline and apply over primary dressing as directed. Secured With: Elastic Bandage 4 inch (ACE bandage) 1 x Per Day/30 Days Discharge Instructions: Secure with ACE bandage as directed. Secured With: [employer], 4.5x3.1 (in/yd) 1 x Per Day/30 Days Discharge Instructions: Secure with Kerlix as directed. Secured With: Transpore Surgical T ape, 2x10 (in/yd) 1 x Per Day/30 Days Discharge Instructions: Secure dressing with tape as directed. 09/22/2023: It has been almost 2 months since I have seen this wound. He started hyperbaric oxygen therapy today; he was meant to start last week, but developed confinement anxiety  while in the chamber and had to be brought out. His PCP has prescribed him an  anxiolytic which he successfully used today to complete his treatment. The wound has quite [name] lot of good granulation tissue forming underneath [name] layer of slough. There is nonviable tendon present at the dorsal base and tip of the great toe. No malodor or purulent drainage. Halloran, Leldon [name] ([id number]) [id number].pdf Page 11 of 12 I used [name] combination of curette, scissors, and forceps to debride slough, subcutaneous tissue, and necrotic tendon from the wound. We are using Santyl here in clinic and they are using Medihoney at home, due to cost. We will continue this course of treatment. He will continue hyperbaric oxygen therapy. Follow-up for wound care visit in 1 week. Electronic Signature(s) Signed: 09/22/2023 4:06:06 PM By: Duanne Guess MD FACS Entered By: Duanne Guess on 09/22/2023 16:06:06 -------------------------------------------------------------------------------- SuperBill Details Patient Name: Date of Service: [name] [name], Fredric [name]. 09/22/2023 Medical Record Number: [id number] Patient Account Number: [id number] Date of Birth/Sex: Treating RN: 1951-08-12 (73 y.o. M) Primary Care Provider: Doreen Beam Other Clinician: Referring Provider: Treating Provider/Extender: Vonzella Nipple, Dhruv Weeks in Treatment: 8 Diagnosis Coding ICD-10 Codes Code Description [phone] Non-pressure chronic ulcer of other part of left foot with bone involvement without evidence of necrosis L97.523 Non-pressure chronic ulcer of other part of left foot with necrosis of muscle L97.522 Non-pressure chronic ulcer of other 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 E11.621 Type 2 diabetes mellitus with foot ulcer I73.9 Peripheral vascular disease, unspecified I70.245 Atherosclerosis of native arteries of left leg with  ulceration of other part of foot Facility Procedures : CPT4 Code: 21308657 1 Description: 1042 - DEB SUBQ TISSUE 20 SQ CM/< ICD-10 Diagnosis Description 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 with Modifier: out evidence of necr Quantity: 1 osis : CPT4 Code: 84696295 1 Description: 1043 - DEB MUSC/FASCIA 20 SQ CM/< ICD-10 Diagnosis Description L97.523 Non-pressure chronic ulcer of other part of left foot with necrosis of muscle Modifier: Quantity: 1 Physician Procedures : CPT4 Code Description Modifier [id number] 99214 - WC PHYS LEVEL 4 - EST PT ICD-10 Diagnosis Description 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t evidence of necro L97.523 Non-pressure chronic ulcer of  other part of left foot with necrosis of muscle E11.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 E11.621 Type 2 diabetes mellitus with foot ulcer Quantity: 1 sis : [id number] 11042 - WC PHYS SUBQ TISS 20 SQ CM ICD-10 Diagnosis Description 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t evidence of necro Quantity: 1 sis Electronic Signature(s) Signed: 09/22/2023 4:06:50 PM By: Duanne Guess MD FACS Entered By: Duanne Guess on 09/22/2023 16:06:50

## 2023-09-23 ENCOUNTER — Encounter (HOSPITAL_BASED_OUTPATIENT_CLINIC_OR_DEPARTMENT_OTHER): Payer: Medicare HMO | Admitting: General Surgery

## 2023-09-23 DIAGNOSIS — E11621 Type 2 diabetes mellitus with foot ulcer: Secondary | ICD-10-CM | POA: Diagnosis not present

## 2023-09-23 DIAGNOSIS — L97524 Non-pressure chronic ulcer of other part of left foot with necrosis of bone: Secondary | ICD-10-CM | POA: Diagnosis not present

## 2023-09-23 LAB — GLUCOSE, CAPILLARY: Glucose-Capillary: 113 mg/dL — ABNORMAL HIGH (ref 70–99)

## 2023-09-23 NOTE — Progress Notes (Addendum)
Avitabile, Hanif [name] ([id number]) [phone].pdf Page 1 of 2 Visit Report for 09/23/2023 Arrival Information Details Patient Name: Date of Service: [name] [name], [name] [name] 09/23/2023 9:30 [name] Medical Record Number: [id number] Patient Account Number: [id number] Date of Birth/Sex: Treating RN: 06-29-1951 (73 y.o. [name] [name], [name] [name] Primary Care [name] [name]: [name] [name] Other Clinician: Haywood [name] Referring [name] [name]: Treating [name] [name]/Extender: [name] [name] Weeks in Treatment: 9 Visit Information History Since Last Visit All ordered tests and consults were completed: Yes Patient Arrived: Ambulatory Added or deleted any medications: No Arrival Time: 09:21 Any new allergies or adverse reactions: No Accompanied By: self Had [name] fall or experienced change in No Transfer Assistance: None activities of daily living that may affect Patient Identification Verified: Yes risk of falls: Secondary Verification Process Completed: Yes Signs or symptoms of abuse/neglect since last visito No Patient Requires Transmission-Based Precautions: No Hospitalized since last visit: No Patient Has Alerts: Yes Implantable device outside of the clinic excluding No Patient Alerts: ABI R 0.68 (07/15/23) cellular tissue based products placed in the center ABI L 1.13 (07/15/23) since last visit: Pain Present Now: No Electronic Signature(s) Signed: 09/23/2023 12:10:31 PM By: [name] [name] CHT EMT BS , , Entered By: [name] [name] on 09/23/2023 12:10:31 -------------------------------------------------------------------------------- Encounter Discharge Information Details Patient Name: Date of Service: [name] [name], [name] [name]. 09/23/2023 9:30 [name] Medical Record Number: [id number] Patient Account Number: [id number] Date of Birth/Sex: Treating RN: 1951/04/04 (73 y.o. [name] [name] Primary Care [name] [name]: [name] [name] Other Clinician: Haywood [name] Referring [name] [name]: Treating  [name] [name]/Extender: [name] [name] Weeks in Treatment: 9 Encounter Discharge Information Items Discharge Condition: Stable Ambulatory Status: Walker Discharge Destination: Home Transportation: Other Accompanied By: driver Schedule Follow-up Appointment: No Clinical Summary of Care: Electronic Signature(s) Signed: 09/23/2023 2:04:36 PM By: [name] [name] CHT EMT BS , , Entered By: [name] [name] on 09/23/2023 14:04:36 [name] [name], [name] [name] ([id number]) [id number] Page 2 of 2 -------------------------------------------------------------------------------- Vitals Details Patient Name: Date of Service: [name] [name], [name] [name] 09/23/2023 9:30 [name] Medical Record Number: [id number] Patient Account Number: [id number] Date of Birth/Sex: Treating RN: June 10, 1951 (73 y.o. [name] [name] Primary Care [name] [name]: [name] [name] Other Clinician: Haywood [name] Referring [name] [name]: Treating [name] [name]/Extender: [name] [name] Weeks in Treatment: 9 Vital Signs Time Taken: 09:27 Temperature (F): 98.3 Height (in): 68 Pulse (bpm): 84 Weight (lbs): 198 Respiratory Rate (breaths/min): 18 Body Mass Index (BMI): 30.1 Blood Pressure (mmHg): 143/77 Capillary Blood Glucose (mg/dl): 242 Reference Range: 80 - 120 mg / dl Electronic Signature(s) Signed: 09/23/2023 12:14:13 PM By: [name] [name] CHT EMT BS , , Entered By: [name] [name] on 09/23/2023 12:14:13

## 2023-09-23 NOTE — Progress Notes (Signed)
Desire, [name] [name] ([id number]) [id number].pdf Page 1 of 2 Visit Report for 09/23/2023 HBO Details Patient Name: Date of Service: [name] [name], [name] [name] 09/23/2023 9:30 [name] Medical Record Number: [id number] Patient Account Number: [id number] Date of Birth/Sex: Treating RN: 1951-01-19 (73 y.o. 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 T Great oe HBO Treatment Details Treatment Number: 3 Patient Type: Outpatient Chamber Type: Monoplace Chamber Serial #: [id number] Treatment Protocol: 2.5 ATA with 90 minutes oxygen, with two 5 minute air breaks Treatment Details Compression Rate Down: 1.0 psi / minute De-Compression Rate Up: 2.5 psi / minute [name] breaks and breathing ir Compress Tx Pressure periods Decompress Decompress Begins Reached (leave unused spaces Begins Ends blank) Chamber Pressure (ATA 1 2.5 2.5 2.5 2.5 2.5 - - 2.5 1 ) Clock Time (24 hr) 10:09 10:27 10:57 11:02 11:32 11:37 - - 12:07 12:17 Treatment Length: 128 (minutes) Treatment Segments: 4 Vital Signs Capillary Blood Glucose Reference Range: 80 - 120 mg / dl HBO Diabetic Blood Glucose Intervention Range: <131 mg/dl or >782 mg/dl Type: Time Vitals Blood Respiratory Capillary Blood Glucose Pulse Action Pulse: Temperature: Taken: Pressure: Rate: Glucose (mg/dl): Meter #: Oximetry (%) Taken: Pre 09:27 143/77 84 18 98.3 147 none per protocol Post 12:34 169/83 73 18 98 114 none per protocol Treatment Response Treatment Toleration: Well Treatment Completion Status: Treatment Completed without Adverse Event Treatment Notes [name] arrived  with normal vital signs Patient self-administered Afrin. After performing [name] safety check, patient was placed in the chamber which was compressed at [name] rate set of 1 psi/min decreased to 0.74 psi/min by setting ventilation rate at 450 L/min. Upon reaching 8 psig, ventilation rate was decreased back to 275 L/min, and increased once more to 1.5 psi/min at 16 psig. He tolerated the treatment and subsequent decompression at the rate of approximately 2.75 psi/min after confirming normal ear equalization. Increased speed was due to urgency as patient stated that he needed to use the restroom. Post- treatment vital signs were within normal range. [name] denied any issues with ear equalization and/or pain associated with barotrauma. Patient was stable upon discharge with driver. Additional Procedure Documentation Tissue Sevierity: Necrosis of bone Physician HBO Attestation: I certify that I supervised this HBO treatment in accordance with Medicare guidelines. [name] trained emergency response team is readily available per Yes hospital policies and procedures. Continue HBOT as ordered. Yes Electronic Signature(s) Signed: 09/26/2023 7:51:06 AM By: Duanne Guess MD FACS Previous Signature: 09/23/2023 2:03:26 PM Version By: [name] [name] , , [name] [name] ([id number]) [id number] Page 2 of 2 Entered By: Duanne [name] on 09/26/2023 07:51:05 -------------------------------------------------------------------------------- HBO Safety Checklist Details Patient Name: Date of Service: [name] [name], [name] [name] 09/23/2023 9:30 [name] Medical Record Number: [id number] Patient Account Number: [id number] Date of Birth/Sex: Treating RN: 02-06-1951 (73 y.o. [name] [name] Primary Care [name] [name]: [name] [name] Other Clinician: Haywood [name] Referring [name] [name]: Treating [name] [name]/Extender: [name] [name] Weeks in Treatment: 9 HBO Safety Checklist Items Safety  Checklist Consent Form Signed Patient voided / foley secured and emptied When did you last eato 0745 - [place] Sausage and Boiled Egg, OJ Last dose of injectable or oral agent n/[name] Ostomy pouch emptied and vented if applicable  NA All implantable devices assessed, documented and approved NA Intravenous access site secured and place NA Valuables secured Linens and cotton and cotton/polyester blend (less than 51% polyester) Personal oil-based products / skin lotions / body lotions removed Wigs or hairpieces removed NA Smoking or tobacco materials removed NA Books / newspapers / magazines / loose paper removed Cologne, aftershave, perfume and deodorant removed Jewelry removed (may wrap wedding band) Make-up removed NA Hair care products removed Battery operated devices (external) removed Heating patches and chemical warmers removed Titanium eyewear removed Nail polish cured greater than 10 hours NA Casting material cured greater than 10 hours NA Hearing aids removed NA Loose dentures or partials removed dentures removed Prosthetics have been removed NA Patient demonstrates correct use of air break device (if applicable) Patient concerns have been addressed Patient grounding bracelet on and cord attached to chamber Specifics for Inpatients (complete in addition to above) Medication sheet sent with patient NA Intravenous medications needed or due during therapy sent with patient NA Drainage tubes (e.g. nasogastric tube or chest tube secured and vented) NA Endotracheal or Tracheotomy tube secured NA Cuff deflated of air and inflated with saline NA Airway suctioned NA Notes Paper version used prior to treatment start. Electronic Signature(s) Signed: 09/23/2023 12:15:49 PM By: [name] [name] , , Entered By: [name] [name] on 09/23/2023 12:15:48

## 2023-09-26 ENCOUNTER — Encounter (HOSPITAL_BASED_OUTPATIENT_CLINIC_OR_DEPARTMENT_OTHER): Payer: Medicare HMO | Admitting: General Surgery

## 2023-09-26 DIAGNOSIS — E11621 Type 2 diabetes mellitus with foot ulcer: Secondary | ICD-10-CM | POA: Diagnosis not present

## 2023-09-26 DIAGNOSIS — Z7902 Long term (current) use of antithrombotics/antiplatelets: Secondary | ICD-10-CM | POA: Diagnosis not present

## 2023-09-26 DIAGNOSIS — E1151 Type 2 diabetes mellitus with diabetic peripheral angiopathy without gangrene: Secondary | ICD-10-CM | POA: Diagnosis not present

## 2023-09-26 DIAGNOSIS — I10 Essential (primary) hypertension: Secondary | ICD-10-CM | POA: Diagnosis not present

## 2023-09-26 DIAGNOSIS — I70222 Atherosclerosis of native arteries of extremities with rest pain, left leg: Secondary | ICD-10-CM | POA: Diagnosis not present

## 2023-09-26 DIAGNOSIS — Z4781 Encounter for orthopedic aftercare following surgical amputation: Secondary | ICD-10-CM | POA: Diagnosis not present

## 2023-09-26 DIAGNOSIS — Z4801 Encounter for change or removal of surgical wound dressing: Secondary | ICD-10-CM | POA: Diagnosis not present

## 2023-09-26 DIAGNOSIS — M48062 Spinal stenosis, lumbar region with neurogenic claudication: Secondary | ICD-10-CM | POA: Diagnosis not present

## 2023-09-26 DIAGNOSIS — L97524 Non-pressure chronic ulcer of other part of left foot with necrosis of bone: Secondary | ICD-10-CM | POA: Diagnosis not present

## 2023-09-26 DIAGNOSIS — Z9181 History of falling: Secondary | ICD-10-CM | POA: Diagnosis not present

## 2023-09-26 DIAGNOSIS — Z89422 Acquired absence of other left toe(s): Secondary | ICD-10-CM | POA: Diagnosis not present

## 2023-09-26 DIAGNOSIS — Z720 Tobacco use: Secondary | ICD-10-CM | POA: Diagnosis not present

## 2023-09-26 DIAGNOSIS — K5520 Angiodysplasia of colon without hemorrhage: Secondary | ICD-10-CM | POA: Diagnosis not present

## 2023-09-26 DIAGNOSIS — D62 Acute posthemorrhagic anemia: Secondary | ICD-10-CM | POA: Diagnosis not present

## 2023-09-26 DIAGNOSIS — Z7982 Long term (current) use of aspirin: Secondary | ICD-10-CM | POA: Diagnosis not present

## 2023-09-26 DIAGNOSIS — K921 Melena: Secondary | ICD-10-CM | POA: Diagnosis not present

## 2023-09-26 LAB — GLUCOSE, CAPILLARY
Glucose-Capillary: 147 mg/dL — ABNORMAL HIGH (ref 70–99)
Glucose-Capillary: 114 mg/dL — ABNORMAL HIGH (ref 70–99)
Glucose-Capillary: 118 mg/dL — ABNORMAL HIGH (ref 70–99)

## 2023-09-26 NOTE — Progress Notes (Signed)
Trick, Aadit A ([id number]) [phone].pdf Page 1 of 1 Visit Report for 09/23/2023 SuperBill Details Patient Name: Date of Service: [name] [name], [name] [name] 09/23/2023 Medical Record Number: [id number] Patient Account Number: [id number] Date of Birth/Sex: Treating RN: 1951/05/08 (73 y.o. 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 E11.621 Type 2 diabetes mellitus with foot ulcer I73.9 Peripheral vascular disease, unspecified I70.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 Facility Procedures CPT4 Code Description Modifier Quantity [id number] G0277-(Facility Use Only) HBOT full body chamber, , 4 ICD-10 Diagnosis Description E11.621 Type 2 diabetes mellitus with foot ulcer 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 L97.522 Non-pressure chronic ulcer of other part of left foot with fat layer exposed Physician Procedures Quantity CPT4 Code Description Modifier [id number] 99183 - WC PHYS HYPERBARIC OXYGEN THERAPY 1 ICD-10 Diagnosis Description E11.621 Type 2 diabetes mellitus with foot ulcer 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

## 2023-09-26 NOTE — Progress Notes (Signed)
Zettel, Lam A ([id number]) [id number].pdf Page 1 of 1 Visit Report for 09/26/2023 SuperBill Details Patient Name: Date of Service: [name] [name], [name] [name] 09/26/2023 Medical Record Number: [id number] Patient Account Number: [id number] Date of Birth/Sex: Treating RN: [birth year] (73 y.o. 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 E11.621 Type 2 diabetes mellitus with foot ulcer I73.9 Peripheral vascular disease, unspecified I70.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 Facility Procedures CPT4 Code Description Modifier Quantity [id number] G0277-(Facility Use Only) HBOT full body chamber, , 4 ICD-10 Diagnosis Description E11.621 Type 2 diabetes mellitus with foot ulcer 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 L97.522 Non-pressure chronic ulcer of other part of left foot with fat layer exposed Physician Procedures Quantity CPT4 Code Description Modifier [id number] 99183 - WC PHYS HYPERBARIC OXYGEN THERAPY 1 ICD-10 Diagnosis Description E11.621 Type 2 diabetes mellitus with foot ulcer 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

## 2023-09-26 NOTE — Progress Notes (Addendum)
Meloy, Conard [name] ([id number]) [phone].pdf Page 1 of 2 Visit Report for 09/26/2023 Arrival Information Details Patient Name: Date of Service: [name] [name], [name] [name] 09/26/2023 11:30 [name] Medical Record Number: [id number] Patient Account Number: [id number] Date of Birth/Sex: Treating RN: 1951/08/16 (73 y.o. [name] Primary Care Zali Kamaka: Doreen Beam Other Clinician: Haywood Pao Referring Conlan Miceli: Treating Deionte Spivack/Extender: Teofilo Pod Weeks in Treatment: 9 Visit Information History Since Last Visit All ordered tests and consults were completed: Yes Patient Arrived: Ambulatory Added or deleted any medications: No Arrival Time: 11:25 Any new allergies or adverse reactions: No Accompanied By: walker Had [name] fall or experienced change in No Transfer Assistance: None activities of daily living that may affect Patient Identification Verified: Yes risk of falls: Secondary Verification Process Completed: Yes Signs or symptoms of abuse/neglect since last visito No Patient Requires Transmission-Based Precautions: No Hospitalized since last visit: No Patient Has Alerts: Yes Implantable device outside of the clinic excluding No Patient Alerts: ABI R 0.68 (07/15/23) cellular tissue based products placed in the center ABI L 1.13 (07/15/23) since last visit: Pain Present Now: No Electronic Signature(s) Signed: 09/26/2023 2:08:53 PM By: Haywood Pao CHT EMT BS , , Entered By: Haywood Pao on 09/26/2023 14:08:53 -------------------------------------------------------------------------------- Encounter Discharge Information Details Patient Name: Date of Service: [name] [name], [name] [name]. 09/26/2023 11:30 [name] Medical Record Number: [id number] Patient Account Number: [id number] Date of Birth/Sex: Treating RN: 1950-10-28 (73 y.o. [name] Primary Care Shantrice Rodenberg: Doreen Beam Other Clinician: Haywood Pao Referring Chyna Kneece: Treating  Rhianne Soman/Extender: Teofilo Pod Weeks in Treatment: 9 Encounter Discharge Information Items Discharge Condition: Stable Ambulatory Status: Walker Discharge Destination: Home Transportation: Other Accompanied By: driver Schedule Follow-up Appointment: No Clinical Summary of Care: Electronic Signature(s) Signed: 09/26/2023 3:41:32 PM By: Haywood Pao CHT EMT BS , , Entered By: Haywood Pao on 09/26/2023 15:41:32 [name] [name], [name] [name] ([id number]) [id number].pdf Page 2 of 2 -------------------------------------------------------------------------------- Vitals Details Patient Name: Date of Service: [name] [name], [name] [name] 09/26/2023 11:30 [name] Medical Record Number: [id number] Patient Account Number: [id number] Date of Birth/Sex: Treating RN: [birth year] (73 y.o. [name] Primary Care Majesty Oehlert: Doreen Beam Other Clinician: Haywood Pao Referring Nikiya Starn: Treating Jadin Kagel/Extender: Teofilo Pod Weeks in Treatment: 9 Vital Signs Time Taken: 11:54 Temperature (F): 98.7 Height (in): 68 Pulse (bpm): 62 Weight (lbs): 198 Respiratory Rate (breaths/min): 18 Body Mass Index (BMI): 30.1 Blood Pressure (mmHg): 157/69 Capillary Blood Glucose (mg/dl): 948 Reference Range: 80 - 120 mg / dl Electronic Signature(s) Signed: 09/26/2023 2:09:39 PM By: Haywood Pao CHT EMT BS , , Entered By: Haywood Pao on 09/26/2023 14:09:39

## 2023-09-26 NOTE — Progress Notes (Addendum)
Schoenfeld, Reon [name] ([id number]) [id number].pdf Page 1 of 2 Visit Report for 09/26/2023 HBO Details Patient Name: Date of Service: [name] [name], [name] [name] 09/26/2023 11:30 [name] Medical Record Number: [id number] Patient Account Number: [id number] Date of Birth/Sex: Treating RN: 1951-01-13 (73 y.o. 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 HBO Treatment Details Treatment Number: 4 Patient Type: Outpatient Chamber Type: Monoplace Chamber Serial #: [id number] Treatment Protocol: 2.5 ATA with 90 minutes oxygen, with two 5 minute air breaks Treatment Details Compression Rate Down: 1.0 psi / minute De-Compression Rate Up: 2.0 psi / minute [name] breaks and breathing ir Compress Tx Pressure periods Decompress Decompress Begins Reached (leave unused spaces Begins Ends blank) Chamber Pressure (ATA 1 2.5 2.5 2.5 2.5 2.5 - - 2.5 1 ) Clock Time (24 hr) 12:28 12:49 13:19 13:24 13:54 13:59 - - 14:29 14:43 Treatment Length: 135 (minutes) Treatment Segments: 4 Vital Signs Capillary Blood Glucose Reference Range: 80 - 120 mg / dl HBO Diabetic Blood Glucose Intervention Range: <131 mg/dl or >295 mg/dl Type: Time Vitals Blood Respiratory Capillary Blood Glucose Pulse Action Pulse: Temperature: Taken: Pressure: Rate: Glucose (mg/dl): Meter #: Oximetry (%) Taken: Pre 11:54 157/69 62 18 98.7 118 none per protocol Post 14:46 164/83 65 18 98.1 134 none per protocol Treatment Response Treatment Toleration: Well Treatment Completion Status: Treatment Completed without Adverse Event Treatment Notes [name] [name]  arrived with normal vital signs Patient self-administered Tylenol Sinus outside of clinic and Afrin in hyperbaric suite after asking Dr. Lady Gary. After performing [name] safety check, patient was placed in the chamber which was compressed at [name] rate set of 1 psi/min decreased to 0.74 psi/min by setting ventilation rate at 450 L/min. Upon reaching 8 psig, ventilation rate was decreased back to 275 L/min, and increased once more to 1.5 psi/min at 16 psig. He tolerated the treatment and subsequent decompression at the rate of approximately 2 psi/min after confirming normal ear equalization. Post-treatment vital signs were within normal range. [name] [name] denied any issues with ear equalization and/or pain associated with barotrauma. Patient was stable upon discharge with driver. Additional Procedure Documentation Tissue Sevierity: Necrosis of bone Physician HBO Attestation: I certify that I supervised this HBO treatment in accordance with Medicare guidelines. [name] trained emergency response team is readily available per Yes hospital policies and procedures. Continue HBOT as ordered. Yes Electronic Signature(s) Signed: 09/26/2023 4:00:31 PM By: Duanne Guess MD FACS Previous Signature: 09/26/2023 3:40:42 PM Version By: Haywood Pao CHT EMT BS , , Entered By: Duanne Guess on 09/26/2023 16:00:31 [name] [name], [name] [name] ([id number]) [id number] Page 2 of 2 -------------------------------------------------------------------------------- HBO Safety Checklist Details Patient Name: Date of Service: [name] [name] 09/26/2023 11:30 [name] Medical Record Number: [id number] Patient Account Number: [id number] Date of Birth/Sex: Treating RN: 07/03/1951 (73 y.o. [name] Primary Care Roberth Berling: Doreen Beam Other Clinician: Haywood Pao Referring Wake Conlee: Treating Akyra Bouchie/Extender: Teofilo Pod Weeks in Treatment: 9 HBO Safety Checklist Items Safety  Checklist Consent Form Signed Patient voided / foley secured and emptied When did you last eato 0900, 2 boiled eggs, 2 [place] sausage patties Last dose of injectable or oral agent n/[name] Ostomy pouch emptied and vented if applicable NA All implantable devices  assessed, documented and approved NA Intravenous access site secured and place NA Valuables secured Linens and cotton and cotton/polyester blend (less than 51% polyester) Personal oil-based products / skin lotions / body lotions removed Wigs or hairpieces removed NA Smoking or tobacco materials removed NA Books / newspapers / magazines / loose paper removed Cologne, aftershave, perfume and deodorant removed Jewelry removed (may wrap wedding band) Make-up removed Hair care products removed Battery operated devices (external) removed Heating patches and chemical warmers removed Titanium eyewear removed Nail polish cured greater than 10 hours NA Casting material cured greater than 10 hours NA Hearing aids removed NA Loose dentures or partials removed dentures removed Prosthetics have been removed NA Patient demonstrates correct use of air break device (if applicable) Patient concerns have been addressed Patient grounding bracelet on and cord attached to chamber Specifics for Inpatients (complete in addition to above) Medication sheet sent with patient NA Intravenous medications needed or due during therapy sent with patient NA Drainage tubes (e.g. nasogastric tube or chest tube secured and vented) NA Endotracheal or Tracheotomy tube secured NA Cuff deflated of air and inflated with saline NA Airway suctioned NA Notes Paper version used prior to treatment start. Electronic Signature(s) Signed: 09/26/2023 2:11:38 PM By: Haywood Pao CHT EMT BS , , Entered By: Haywood Pao on 09/26/2023 14:11:38

## 2023-09-27 ENCOUNTER — Encounter (HOSPITAL_BASED_OUTPATIENT_CLINIC_OR_DEPARTMENT_OTHER): Payer: Medicare HMO | Admitting: General Surgery

## 2023-09-27 LAB — GLUCOSE, CAPILLARY
Glucose-Capillary: 144 mg/dL — ABNORMAL HIGH (ref 70–99)
Glucose-Capillary: 134 mg/dL — ABNORMAL HIGH (ref 70–99)

## 2023-09-28 ENCOUNTER — Encounter (HOSPITAL_BASED_OUTPATIENT_CLINIC_OR_DEPARTMENT_OTHER): Payer: Medicare HMO | Admitting: General Surgery

## 2023-09-29 ENCOUNTER — Encounter (HOSPITAL_BASED_OUTPATIENT_CLINIC_OR_DEPARTMENT_OTHER): Payer: Medicare HMO | Admitting: General Surgery

## 2023-09-29 DIAGNOSIS — I70222 Atherosclerosis of native arteries of extremities with rest pain, left leg: Secondary | ICD-10-CM | POA: Diagnosis not present

## 2023-09-29 DIAGNOSIS — Z4781 Encounter for orthopedic aftercare following surgical amputation: Secondary | ICD-10-CM | POA: Diagnosis not present

## 2023-09-29 DIAGNOSIS — K5520 Angiodysplasia of colon without hemorrhage: Secondary | ICD-10-CM | POA: Diagnosis not present

## 2023-09-29 DIAGNOSIS — Z89422 Acquired absence of other left toe(s): Secondary | ICD-10-CM | POA: Diagnosis not present

## 2023-09-29 DIAGNOSIS — M48062 Spinal stenosis, lumbar region with neurogenic claudication: Secondary | ICD-10-CM | POA: Diagnosis not present

## 2023-09-29 DIAGNOSIS — Z9181 History of falling: Secondary | ICD-10-CM | POA: Diagnosis not present

## 2023-09-29 DIAGNOSIS — D62 Acute posthemorrhagic anemia: Secondary | ICD-10-CM | POA: Diagnosis not present

## 2023-09-29 DIAGNOSIS — Z7982 Long term (current) use of aspirin: Secondary | ICD-10-CM | POA: Diagnosis not present

## 2023-09-29 DIAGNOSIS — I10 Essential (primary) hypertension: Secondary | ICD-10-CM | POA: Diagnosis not present

## 2023-09-29 DIAGNOSIS — E1151 Type 2 diabetes mellitus with diabetic peripheral angiopathy without gangrene: Secondary | ICD-10-CM | POA: Diagnosis not present

## 2023-09-29 DIAGNOSIS — Z7902 Long term (current) use of antithrombotics/antiplatelets: Secondary | ICD-10-CM | POA: Diagnosis not present

## 2023-09-29 DIAGNOSIS — K921 Melena: Secondary | ICD-10-CM | POA: Diagnosis not present

## 2023-09-29 DIAGNOSIS — Z4801 Encounter for change or removal of surgical wound dressing: Secondary | ICD-10-CM | POA: Diagnosis not present

## 2023-09-29 DIAGNOSIS — Z720 Tobacco use: Secondary | ICD-10-CM | POA: Diagnosis not present

## 2023-09-30 ENCOUNTER — Encounter (HOSPITAL_BASED_OUTPATIENT_CLINIC_OR_DEPARTMENT_OTHER): Payer: Medicare HMO | Admitting: General Surgery

## 2023-10-03 ENCOUNTER — Ambulatory Visit (HOSPITAL_BASED_OUTPATIENT_CLINIC_OR_DEPARTMENT_OTHER): Payer: Medicare HMO | Admitting: General Surgery

## 2023-10-03 ENCOUNTER — Encounter (HOSPITAL_BASED_OUTPATIENT_CLINIC_OR_DEPARTMENT_OTHER): Payer: Medicare HMO | Admitting: General Surgery

## 2023-10-04 ENCOUNTER — Encounter (HOSPITAL_BASED_OUTPATIENT_CLINIC_OR_DEPARTMENT_OTHER): Payer: Medicare HMO | Admitting: General Surgery

## 2023-10-04 DIAGNOSIS — M48062 Spinal stenosis, lumbar region with neurogenic claudication: Secondary | ICD-10-CM | POA: Diagnosis not present

## 2023-10-04 DIAGNOSIS — Z4801 Encounter for change or removal of surgical wound dressing: Secondary | ICD-10-CM | POA: Diagnosis not present

## 2023-10-04 DIAGNOSIS — I10 Essential (primary) hypertension: Secondary | ICD-10-CM | POA: Diagnosis not present

## 2023-10-04 DIAGNOSIS — L97524 Non-pressure chronic ulcer of other part of left foot with necrosis of bone: Secondary | ICD-10-CM | POA: Diagnosis not present

## 2023-10-04 DIAGNOSIS — E11621 Type 2 diabetes mellitus with foot ulcer: Secondary | ICD-10-CM | POA: Diagnosis not present

## 2023-10-04 DIAGNOSIS — Z7982 Long term (current) use of aspirin: Secondary | ICD-10-CM | POA: Diagnosis not present

## 2023-10-04 DIAGNOSIS — Z4781 Encounter for orthopedic aftercare following surgical amputation: Secondary | ICD-10-CM | POA: Diagnosis not present

## 2023-10-04 DIAGNOSIS — E1151 Type 2 diabetes mellitus with diabetic peripheral angiopathy without gangrene: Secondary | ICD-10-CM | POA: Diagnosis not present

## 2023-10-04 DIAGNOSIS — I70222 Atherosclerosis of native arteries of extremities with rest pain, left leg: Secondary | ICD-10-CM | POA: Diagnosis not present

## 2023-10-04 DIAGNOSIS — K921 Melena: Secondary | ICD-10-CM | POA: Diagnosis not present

## 2023-10-04 DIAGNOSIS — Z720 Tobacco use: Secondary | ICD-10-CM | POA: Diagnosis not present

## 2023-10-04 DIAGNOSIS — Z89422 Acquired absence of other left toe(s): Secondary | ICD-10-CM | POA: Diagnosis not present

## 2023-10-04 DIAGNOSIS — D62 Acute posthemorrhagic anemia: Secondary | ICD-10-CM | POA: Diagnosis not present

## 2023-10-04 DIAGNOSIS — K5520 Angiodysplasia of colon without hemorrhage: Secondary | ICD-10-CM | POA: Diagnosis not present

## 2023-10-04 DIAGNOSIS — Z7902 Long term (current) use of antithrombotics/antiplatelets: Secondary | ICD-10-CM | POA: Diagnosis not present

## 2023-10-04 DIAGNOSIS — Z9181 History of falling: Secondary | ICD-10-CM | POA: Diagnosis not present

## 2023-10-04 LAB — GLUCOSE, CAPILLARY
Glucose-Capillary: 138 mg/dL — ABNORMAL HIGH (ref 70–99)
Glucose-Capillary: 99 mg/dL (ref 70–99)

## 2023-10-05 ENCOUNTER — Encounter (HOSPITAL_BASED_OUTPATIENT_CLINIC_OR_DEPARTMENT_OTHER): Payer: Medicare HMO | Admitting: General Surgery

## 2023-10-05 DIAGNOSIS — L97524 Non-pressure chronic ulcer of other part of left foot with necrosis of bone: Secondary | ICD-10-CM | POA: Diagnosis not present

## 2023-10-05 DIAGNOSIS — E11621 Type 2 diabetes mellitus with foot ulcer: Secondary | ICD-10-CM | POA: Diagnosis not present

## 2023-10-05 LAB — GLUCOSE, CAPILLARY
Glucose-Capillary: 140 mg/dL — ABNORMAL HIGH (ref 70–99)
Glucose-Capillary: 95 mg/dL (ref 70–99)

## 2023-10-06 ENCOUNTER — Encounter (HOSPITAL_BASED_OUTPATIENT_CLINIC_OR_DEPARTMENT_OTHER): Payer: Medicare HMO | Admitting: General Surgery

## 2023-10-06 DIAGNOSIS — E11621 Type 2 diabetes mellitus with foot ulcer: Secondary | ICD-10-CM | POA: Diagnosis not present

## 2023-10-06 DIAGNOSIS — Z720 Tobacco use: Secondary | ICD-10-CM | POA: Diagnosis not present

## 2023-10-06 DIAGNOSIS — K5520 Angiodysplasia of colon without hemorrhage: Secondary | ICD-10-CM | POA: Diagnosis not present

## 2023-10-06 DIAGNOSIS — Z7982 Long term (current) use of aspirin: Secondary | ICD-10-CM | POA: Diagnosis not present

## 2023-10-06 DIAGNOSIS — Z4801 Encounter for change or removal of surgical wound dressing: Secondary | ICD-10-CM | POA: Diagnosis not present

## 2023-10-06 DIAGNOSIS — Z7902 Long term (current) use of antithrombotics/antiplatelets: Secondary | ICD-10-CM | POA: Diagnosis not present

## 2023-10-06 DIAGNOSIS — M48062 Spinal stenosis, lumbar region with neurogenic claudication: Secondary | ICD-10-CM | POA: Diagnosis not present

## 2023-10-06 DIAGNOSIS — Z89422 Acquired absence of other left toe(s): Secondary | ICD-10-CM | POA: Diagnosis not present

## 2023-10-06 DIAGNOSIS — D62 Acute posthemorrhagic anemia: Secondary | ICD-10-CM | POA: Diagnosis not present

## 2023-10-06 DIAGNOSIS — I10 Essential (primary) hypertension: Secondary | ICD-10-CM | POA: Diagnosis not present

## 2023-10-06 DIAGNOSIS — E1151 Type 2 diabetes mellitus with diabetic peripheral angiopathy without gangrene: Secondary | ICD-10-CM | POA: Diagnosis not present

## 2023-10-06 DIAGNOSIS — Z4781 Encounter for orthopedic aftercare following surgical amputation: Secondary | ICD-10-CM | POA: Diagnosis not present

## 2023-10-06 DIAGNOSIS — Z9181 History of falling: Secondary | ICD-10-CM | POA: Diagnosis not present

## 2023-10-06 DIAGNOSIS — L97524 Non-pressure chronic ulcer of other part of left foot with necrosis of bone: Secondary | ICD-10-CM | POA: Diagnosis not present

## 2023-10-06 DIAGNOSIS — I70222 Atherosclerosis of native arteries of extremities with rest pain, left leg: Secondary | ICD-10-CM | POA: Diagnosis not present

## 2023-10-06 DIAGNOSIS — K921 Melena: Secondary | ICD-10-CM | POA: Diagnosis not present

## 2023-10-06 LAB — GLUCOSE, CAPILLARY
Glucose-Capillary: 154 mg/dL — ABNORMAL HIGH (ref 70–99)
Glucose-Capillary: 106 mg/dL — ABNORMAL HIGH (ref 70–99)

## 2023-10-07 ENCOUNTER — Encounter (HOSPITAL_BASED_OUTPATIENT_CLINIC_OR_DEPARTMENT_OTHER): Payer: Medicare HMO | Admitting: General Surgery

## 2023-10-07 DIAGNOSIS — E11621 Type 2 diabetes mellitus with foot ulcer: Secondary | ICD-10-CM | POA: Diagnosis not present

## 2023-10-07 DIAGNOSIS — L97524 Non-pressure chronic ulcer of other part of left foot with necrosis of bone: Secondary | ICD-10-CM | POA: Diagnosis not present

## 2023-10-07 LAB — GLUCOSE, CAPILLARY
Glucose-Capillary: 173 mg/dL — ABNORMAL HIGH (ref 70–99)
Glucose-Capillary: 124 mg/dL — ABNORMAL HIGH (ref 70–99)

## 2023-10-10 ENCOUNTER — Encounter (HOSPITAL_BASED_OUTPATIENT_CLINIC_OR_DEPARTMENT_OTHER): Payer: Medicare HMO | Attending: General Surgery | Admitting: General Surgery

## 2023-10-10 ENCOUNTER — Ambulatory Visit (HOSPITAL_BASED_OUTPATIENT_CLINIC_OR_DEPARTMENT_OTHER): Payer: Medicare HMO | Admitting: General Surgery

## 2023-10-10 DIAGNOSIS — I70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: Secondary | ICD-10-CM | POA: Insufficient documentation

## 2023-10-10 DIAGNOSIS — L97522 Non-pressure chronic ulcer of other part of left foot with fat layer exposed: Secondary | ICD-10-CM | POA: Diagnosis not present

## 2023-10-10 DIAGNOSIS — E11621 Type 2 diabetes mellitus with foot ulcer: Secondary | ICD-10-CM | POA: Insufficient documentation

## 2023-10-10 DIAGNOSIS — L97524 Non-pressure chronic ulcer of other part of left foot with necrosis of bone: Secondary | ICD-10-CM | POA: Diagnosis not present

## 2023-10-10 DIAGNOSIS — L97523 Non-pressure chronic ulcer of other part of left foot with necrosis of muscle: Secondary | ICD-10-CM | POA: Diagnosis not present

## 2023-10-10 DIAGNOSIS — L97526 Non-pressure chronic ulcer of other part of left foot with bone involvement without evidence of necrosis: Secondary | ICD-10-CM | POA: Diagnosis not present

## 2023-10-10 DIAGNOSIS — E11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: Secondary | ICD-10-CM | POA: Insufficient documentation

## 2023-10-10 DIAGNOSIS — Z89422 Acquired absence of other left toe(s): Secondary | ICD-10-CM | POA: Insufficient documentation

## 2023-10-10 LAB — GLUCOSE, CAPILLARY
Glucose-Capillary: 134 mg/dL — ABNORMAL HIGH (ref 70–99)
Glucose-Capillary: 102 mg/dL — ABNORMAL HIGH (ref 70–99)

## 2023-10-11 ENCOUNTER — Encounter (HOSPITAL_BASED_OUTPATIENT_CLINIC_OR_DEPARTMENT_OTHER): Payer: Medicare HMO | Admitting: General Surgery

## 2023-10-11 DIAGNOSIS — E11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: Secondary | ICD-10-CM | POA: Diagnosis not present

## 2023-10-11 DIAGNOSIS — L97524 Non-pressure chronic ulcer of other part of left foot with necrosis of bone: Secondary | ICD-10-CM | POA: Diagnosis not present

## 2023-10-11 DIAGNOSIS — E11621 Type 2 diabetes mellitus with foot ulcer: Secondary | ICD-10-CM | POA: Diagnosis not present

## 2023-10-11 LAB — GLUCOSE, CAPILLARY
Glucose-Capillary: 156 mg/dL — ABNORMAL HIGH (ref 70–99)
Glucose-Capillary: 101 mg/dL — ABNORMAL HIGH (ref 70–99)

## 2023-10-12 ENCOUNTER — Encounter (HOSPITAL_BASED_OUTPATIENT_CLINIC_OR_DEPARTMENT_OTHER): Payer: Medicare HMO | Admitting: General Surgery

## 2023-10-12 DIAGNOSIS — L97524 Non-pressure chronic ulcer of other part of left foot with necrosis of bone: Secondary | ICD-10-CM | POA: Diagnosis not present

## 2023-10-12 DIAGNOSIS — E11621 Type 2 diabetes mellitus with foot ulcer: Secondary | ICD-10-CM | POA: Diagnosis not present

## 2023-10-12 DIAGNOSIS — E11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: Secondary | ICD-10-CM | POA: Diagnosis not present

## 2023-10-12 LAB — GLUCOSE, CAPILLARY
Glucose-Capillary: 151 mg/dL — ABNORMAL HIGH (ref 70–99)
Glucose-Capillary: 112 mg/dL — ABNORMAL HIGH (ref 70–99)

## 2023-10-12 NOTE — Progress Notes (Signed)
 Patient ID: [name] [name], male   DOB: 1951/07/20, 73 y.o.   MRN: [id number]  Reason for Consult: Follow-up   Referred by Rosamond Leta NOVAK, MD  Subjective:     HPI  [name] [name] is a 73 y.o. male with a history of hypertension, diabetes and severe PAD who underwent left SFA to posterior tibial bypass with reverse saphenous vein on 06/20/2023 for left foot wounds.  He has been undergoing hyperbaric oxygen  therapy at the wound care center and reports his toe wound has been healing appropriately and the wound care doctors have been pleased with his results.  He remains compliant with aspirin  and Plavix   Past Medical History:  Diagnosis Date   Arthritis    Diabetes mellitus without complication (HCC)    GERD (gastroesophageal reflux disease)    Headache    Peripheral vascular disease (HCC)    Family History  Problem Relation Age of Onset   Stroke Mother    Cancer Father    Heart disease Sister    Cancer Sister    Past Surgical History:  Procedure Laterality Date   ABDOMINAL AORTOGRAM W/LOWER EXTREMITY N/A 06/08/2023   Procedure: ABDOMINAL AORTOGRAM W/LOWER EXTREMITY;  Surgeon: Lanis Fonda BRAVO, MD;  Location: [facility] INVASIVE CV LAB;  Service: Cardiovascular;  Laterality: N/A;   APPENDECTOMY  1956   BACK SURGERY     BIOPSY  08/07/2023   Procedure: BIOPSY;  Surgeon: Federico Rosario BROCKS, MD;  Location: [facility] ENDOSCOPY;  Service: Gastroenterology;;   COLONOSCOPY  1997   COLONOSCOPY WITH PROPOFOL  N/A 08/09/2023   Procedure: COLONOSCOPY WITH PROPOFOL ;  Surgeon: Leigh Elspeth SQUIBB, MD;  Location: [facility] ENDOSCOPY;  Service: Gastroenterology;  Laterality: N/A;   ESOPHAGOGASTRODUODENOSCOPY (EGD) WITH PROPOFOL  N/A 08/07/2023   Procedure: ESOPHAGOGASTRODUODENOSCOPY (EGD) WITH PROPOFOL ;  Surgeon: Federico Rosario BROCKS, MD;  Location: [facility] ENDOSCOPY;  Service: Gastroenterology;  Laterality: N/A;   FEMORAL-TIBIAL BYPASS GRAFT Left 06/14/2023   Procedure: LEFT SUPERFICIAL FEMORAL-POSTERIOR TIBIAL ARTERY BYPASS;   Surgeon: Pearline [name] RAMAN, MD;  Location: [facility] OR;  Service: Vascular;  Laterality: Left;   HOT HEMOSTASIS N/A 08/09/2023   Procedure: HOT HEMOSTASIS (ARGON PLASMA COAGULATION/BICAP);  Surgeon: Leigh Elspeth SQUIBB, MD;  Location: [facility] ENDOSCOPY;  Service: Gastroenterology;  Laterality: N/A;  APC-circumfrential   KNEE ARTHROSCOPY Left    LUMBAR LAMINECTOMY/DECOMPRESSION MICRODISCECTOMY Bilateral 10/18/2017   Procedure: Bilateral Lumbar Two-Three Lumbar Three-Four Laminotomies;  Surgeon: Colon Shove, MD;  Location: [facility] OR;  Service: Neurosurgery;  Laterality: Bilateral;  Bilateral L2-3 L3-4 Laminotomies   NECK SURGERY  2001   PERIPHERAL VASCULAR INTERVENTION  06/08/2023   Procedure: PERIPHERAL VASCULAR INTERVENTION;  Surgeon: Lanis Fonda BRAVO, MD;  Location: [facility] INVASIVE CV LAB;  Service: Cardiovascular;;  Lt Common Iliac   VEIN HARVEST Left 06/14/2023   Procedure: SAPHENOUS VEIN HARVEST;  Surgeon: Pearline [name] RAMAN, MD;  Location: [facility] OR;  Service: Vascular;  Laterality: Left;   WOUND DEBRIDEMENT Left 06/20/2023   Procedure: DEBRIDEMENT WOUND;  Surgeon: Pearline [name] RAMAN, MD;  Location: [facility] OR;  Service: Vascular;  Laterality: Left;    Short Social History:  Social History   Tobacco Use   Smoking status: Former    Current packs/day: 0.00    Average packs/day: 0.3 packs/day for 59.0 years (14.8 ttl pk-yrs)    Types: Cigarettes    Start date: 48    Quit date: 06/01/2023    Years since quitting: 0.3   Smokeless tobacco: Never  Substance Use Topics   Alcohol  use: Not Currently  Comment: occ    Allergies  Allergen Reactions   Gadobutrol  Hives, Itching and Swelling    After injection of MRI contrast Gadavist  pt experienced facial itching, stuffy nose, eye swelling   Iodinated Contrast Media Hives    Current Outpatient Medications  Medication Sig Dispense Refill   amLODipine  (NORVASC ) 5 MG tablet Take 1 tablet (5 mg total) by mouth daily. 30 tablet 0   Ascorbic Acid  1000 MG TBCR Take 1,000  mg by mouth in the morning and at bedtime.     aspirin  81 MG chewable tablet Chew 1 tablet (81 mg total) by mouth daily.     atorvastatin  (LIPITOR) 40 MG tablet Take 1 tablet (40 mg total) by mouth daily. 30 tablet 1   ferrous sulfate 325 (65 FE) MG tablet Take 325 mg by mouth daily with breakfast.     fluconazole  (DIFLUCAN ) 200 MG tablet Take 2 tablets by mouth the first day then 1 tablet daily until gone 15 tablet 0   gabapentin  (NEURONTIN ) 300 MG capsule Take 300 mg by mouth 3 (three) times daily.     pantoprazole  (PROTONIX ) 40 MG tablet Take 1 tablet (40 mg total) by mouth 2 (two) times daily. 60 tablet 2   vitamin B-12 (CYANOCOBALAMIN ) 1000 MCG tablet Take 1,000 mcg by mouth daily.     vitamin E 100 UNIT capsule Take 100 Units by mouth daily.     Wound Dressings ([employer] MANUK[employer] HONEY WOUND EX) Apply 1 Application topically as needed (for bandage changes).     Zinc  Acetate 50 MG CAPS Take 50 mg by mouth in the morning.     clopidogrel  (PLAVIX ) 75 MG tablet Take 1 tablet (75 mg total) by mouth daily with breakfast. 90 tablet 3   No current facility-administered medications for this visit.   Facility-Administered Medications Ordered in Other Visits  Medication Dose Route Frequency Provider Last Rate Last Admin   ondansetron  (ZOFRAN ) 4 mg in sodium chloride  0.9 % 50 mL IVPB  4 mg Intravenous Q6H PRN Colon Shove, MD        REVIEW OF SYSTEMS   All other systems were reviewed and are negative     Objective:  Objective   Vitals:   10/14/23 1004  BP: (!) 167/79  Pulse: 65  Resp: 20  Temp: 98.2 F (36.8 C)  SpO2: 97%  Weight: 213 lb (96.6 kg)  Height: 5' 9 (1.753 m)   Body mass index is 31.45 kg/m.  Physical Exam General: no acute distress Cardiac: hemodynamically stable Pulm: normal work of breathing Neuro: alert, no focal deficit Extremities: Left foot bandage clean dry and intact    Data: ABI +---------+------------------+-----+----------+--------+  Right   Rt  Pressure (mmHg)IndexWaveform  Comment   +---------+------------------+-----+----------+--------+  Brachial 175                                        +---------+------------------+-----+----------+--------+  PTA     67                0.38 monophasic          +---------+------------------+-----+----------+--------+  DP      106               0.61 monophasic          +---------+------------------+-----+----------+--------+  Great Toe17                0.10 Abnormal            +---------+------------------+-----+----------+--------+   +---------+------------------+-----+----------+-------+  Left    Lt Pressure (mmHg)IndexWaveform  Comment  +---------+------------------+-----+----------+-------+  Brachial 167                                       +---------+------------------+-----+----------+-------+  PTA     160               0.91 monophasic         +---------+------------------+-----+----------+-------+  DP      164               0.94 monophasic         +---------+------------------+-----+----------+-------+  Great Toe                       Absent    bandage  +---------+------------------+-----+----------+-------+   Bypass duplex +--------------------+--------+---------------+--------+--------+                     PSV cm/sStenosis       WaveformComments  +--------------------+--------+---------------+--------+--------+  Inflow             111                    biphasic          +--------------------+--------+---------------+--------+--------+  Proximal Anastomosis285     50-70% stenosisbiphasic          +--------------------+--------+---------------+--------+--------+  Proximal Graft      197     50-70% stenosisbiphasic          +--------------------+--------+---------------+--------+--------+  Mid Graft           169                    biphasic           +--------------------+--------+---------------+--------+--------+  Distal Graft        153                    biphasic          +--------------------+--------+---------------+--------+--------+  Distal Anastomosis  185     50-70% stenosisbiphasic          +--------------------+--------+---------------+--------+--------+  Outflow            256     50-74% stenosisbiphasic          +--------------------+--------+---------------+--------+--------+   Reviewed most recent note from wound care center     Assessment/Plan:     [name] [name] is a 73 y.o. 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  His ABI is stable and the elevated velocity at the proximal anastomosis that was seen at his 1 month visit is now decreased to 285 from 360.  He continues to be followed at the wound care center is undergoing hyperbaric oxygen  therapy.  Encouraged to continue.  Will continue to defer wound care to the wound care center And plan to continue surveillance with follow-up in 3 months with an ABI and bypass duplex.    Recommendations to optimize cardiovascular risk: Abstinence from all tobacco products. Blood glucose control with goal A1c < 7%. Blood pressure control with goal blood pressure < 140/90 mmHg. Lipid reduction therapy with goal LDL-C <100 mg/dL  Aspirin  81mg  PO QD.  Atorvastatin  40-80mg  PO QD (or other high intensity statin therapy).     [name] GORMAN Serve  MD [facility]

## 2023-10-13 ENCOUNTER — Encounter (HOSPITAL_BASED_OUTPATIENT_CLINIC_OR_DEPARTMENT_OTHER): Payer: Medicare HMO | Admitting: General Surgery

## 2023-10-13 DIAGNOSIS — E11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: Secondary | ICD-10-CM | POA: Diagnosis not present

## 2023-10-13 DIAGNOSIS — L97524 Non-pressure chronic ulcer of other part of left foot with necrosis of bone: Secondary | ICD-10-CM | POA: Diagnosis not present

## 2023-10-13 DIAGNOSIS — E11621 Type 2 diabetes mellitus with foot ulcer: Secondary | ICD-10-CM | POA: Diagnosis not present

## 2023-10-13 LAB — GLUCOSE, CAPILLARY
Glucose-Capillary: 186 mg/dL — ABNORMAL HIGH (ref 70–99)
Glucose-Capillary: 109 mg/dL — ABNORMAL HIGH (ref 70–99)

## 2023-10-14 ENCOUNTER — Ambulatory Visit (INDEPENDENT_AMBULATORY_CARE_PROVIDER_SITE_OTHER)
Admission: RE | Admit: 2023-10-14 | Discharge: 2023-10-14 | Disposition: A | Discharge status: Home / Self Care | Payer: Medicare HMO | Source: Ambulatory Visit | Attending: Vascular Surgery | Admitting: Vascular Surgery

## 2023-10-14 ENCOUNTER — Encounter: Payer: Self-pay | Admitting: Vascular Surgery

## 2023-10-14 ENCOUNTER — Ambulatory Visit (INDEPENDENT_AMBULATORY_CARE_PROVIDER_SITE_OTHER): Payer: Medicare HMO | Admitting: Vascular Surgery

## 2023-10-14 ENCOUNTER — Ambulatory Visit (HOSPITAL_COMMUNITY)
Admission: RE | Admit: 2023-10-14 | Discharge: 2023-10-14 | Disposition: A | Discharge status: Home / Self Care | Payer: Medicare HMO | Source: Ambulatory Visit | Attending: Vascular Surgery | Admitting: Vascular Surgery

## 2023-10-14 VITALS — BP 167/79 | HR 65 | Temp 98.2°F | Resp 20 | Ht 69.0 in | Wt 213.0 lb

## 2023-10-14 DIAGNOSIS — I70222 Atherosclerosis of native arteries of extremities with rest pain, left leg: Secondary | ICD-10-CM

## 2023-10-14 DIAGNOSIS — I70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: Secondary | ICD-10-CM | POA: Diagnosis not present

## 2023-10-14 LAB — VAS US ABI WITH/WO TBI
Left ABI: 0.94
Right ABI: 0.61

## 2023-10-14 MED ORDER — CLOPIDOGREL BISULFATE 75 MG PO TABS: 75.0000 mg | ORAL_TABLET | Freq: Every day | ORAL | 3 refills | Status: DC

## 2023-10-17 ENCOUNTER — Encounter (HOSPITAL_BASED_OUTPATIENT_CLINIC_OR_DEPARTMENT_OTHER): Payer: Medicare HMO | Admitting: General Surgery

## 2023-10-17 DIAGNOSIS — E11621 Type 2 diabetes mellitus with foot ulcer: Secondary | ICD-10-CM | POA: Diagnosis not present

## 2023-10-17 DIAGNOSIS — E11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: Secondary | ICD-10-CM | POA: Diagnosis not present

## 2023-10-17 DIAGNOSIS — L97522 Non-pressure chronic ulcer of other part of left foot with fat layer exposed: Secondary | ICD-10-CM | POA: Diagnosis not present

## 2023-10-17 DIAGNOSIS — L97524 Non-pressure chronic ulcer of other part of left foot with necrosis of bone: Secondary | ICD-10-CM | POA: Diagnosis not present

## 2023-10-17 LAB — GLUCOSE, CAPILLARY
Glucose-Capillary: 145 mg/dL — ABNORMAL HIGH (ref 70–99)
Glucose-Capillary: 122 mg/dL — ABNORMAL HIGH (ref 70–99)

## 2023-10-18 ENCOUNTER — Encounter (HOSPITAL_BASED_OUTPATIENT_CLINIC_OR_DEPARTMENT_OTHER): Payer: Medicare HMO | Admitting: General Surgery

## 2023-10-18 DIAGNOSIS — E11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: Secondary | ICD-10-CM | POA: Diagnosis not present

## 2023-10-18 DIAGNOSIS — E11621 Type 2 diabetes mellitus with foot ulcer: Secondary | ICD-10-CM | POA: Diagnosis not present

## 2023-10-18 DIAGNOSIS — L97524 Non-pressure chronic ulcer of other part of left foot with necrosis of bone: Secondary | ICD-10-CM | POA: Diagnosis not present

## 2023-10-18 LAB — GLUCOSE, CAPILLARY
Glucose-Capillary: 159 mg/dL — ABNORMAL HIGH (ref 70–99)
Glucose-Capillary: 163 mg/dL — ABNORMAL HIGH (ref 70–99)
Glucose-Capillary: 112 mg/dL — ABNORMAL HIGH (ref 70–99)

## 2023-10-19 ENCOUNTER — Encounter (HOSPITAL_BASED_OUTPATIENT_CLINIC_OR_DEPARTMENT_OTHER): Payer: Medicare HMO | Admitting: General Surgery

## 2023-10-19 DIAGNOSIS — E11621 Type 2 diabetes mellitus with foot ulcer: Secondary | ICD-10-CM | POA: Diagnosis not present

## 2023-10-19 DIAGNOSIS — E11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: Secondary | ICD-10-CM | POA: Diagnosis not present

## 2023-10-19 DIAGNOSIS — L97524 Non-pressure chronic ulcer of other part of left foot with necrosis of bone: Secondary | ICD-10-CM | POA: Diagnosis not present

## 2023-10-19 LAB — GLUCOSE, CAPILLARY
Glucose-Capillary: 137 mg/dL — ABNORMAL HIGH (ref 70–99)
Glucose-Capillary: 123 mg/dL — ABNORMAL HIGH (ref 70–99)

## 2023-10-20 ENCOUNTER — Encounter (HOSPITAL_BASED_OUTPATIENT_CLINIC_OR_DEPARTMENT_OTHER): Payer: Medicare HMO | Admitting: General Surgery

## 2023-10-21 ENCOUNTER — Encounter (HOSPITAL_BASED_OUTPATIENT_CLINIC_OR_DEPARTMENT_OTHER): Payer: Medicare HMO | Admitting: General Surgery

## 2023-10-21 DIAGNOSIS — L97524 Non-pressure chronic ulcer of other part of left foot with necrosis of bone: Secondary | ICD-10-CM | POA: Diagnosis not present

## 2023-10-21 DIAGNOSIS — E11621 Type 2 diabetes mellitus with foot ulcer: Secondary | ICD-10-CM | POA: Diagnosis not present

## 2023-10-21 DIAGNOSIS — E11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: Secondary | ICD-10-CM | POA: Diagnosis not present

## 2023-10-21 LAB — GLUCOSE, CAPILLARY
Glucose-Capillary: 183 mg/dL — ABNORMAL HIGH (ref 70–99)
Glucose-Capillary: 127 mg/dL — ABNORMAL HIGH (ref 70–99)

## 2023-10-24 ENCOUNTER — Encounter (HOSPITAL_BASED_OUTPATIENT_CLINIC_OR_DEPARTMENT_OTHER): Payer: Medicare HMO | Admitting: General Surgery

## 2023-10-24 DIAGNOSIS — L97522 Non-pressure chronic ulcer of other part of left foot with fat layer exposed: Secondary | ICD-10-CM | POA: Diagnosis not present

## 2023-10-24 DIAGNOSIS — E11621 Type 2 diabetes mellitus with foot ulcer: Secondary | ICD-10-CM | POA: Diagnosis not present

## 2023-10-24 DIAGNOSIS — E11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: Secondary | ICD-10-CM | POA: Diagnosis not present

## 2023-10-24 DIAGNOSIS — L97524 Non-pressure chronic ulcer of other part of left foot with necrosis of bone: Secondary | ICD-10-CM | POA: Diagnosis not present

## 2023-10-24 LAB — GLUCOSE, CAPILLARY: Glucose-Capillary: 156 mg/dL — ABNORMAL HIGH (ref 70–99)

## 2023-10-25 ENCOUNTER — Encounter (HOSPITAL_BASED_OUTPATIENT_CLINIC_OR_DEPARTMENT_OTHER): Payer: Medicare HMO | Admitting: General Surgery

## 2023-10-25 DIAGNOSIS — L97524 Non-pressure chronic ulcer of other part of left foot with necrosis of bone: Secondary | ICD-10-CM | POA: Diagnosis not present

## 2023-10-25 DIAGNOSIS — E11621 Type 2 diabetes mellitus with foot ulcer: Secondary | ICD-10-CM | POA: Diagnosis not present

## 2023-10-25 DIAGNOSIS — E11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: Secondary | ICD-10-CM | POA: Diagnosis not present

## 2023-10-25 LAB — GLUCOSE, CAPILLARY
Glucose-Capillary: 108 mg/dL — ABNORMAL HIGH (ref 70–99)
Glucose-Capillary: 140 mg/dL — ABNORMAL HIGH (ref 70–99)
Glucose-Capillary: 92 mg/dL (ref 70–99)

## 2023-10-26 ENCOUNTER — Encounter (HOSPITAL_BASED_OUTPATIENT_CLINIC_OR_DEPARTMENT_OTHER): Payer: Medicare HMO | Admitting: General Surgery

## 2023-10-27 ENCOUNTER — Other Ambulatory Visit: Payer: Self-pay | Admitting: *Deleted

## 2023-10-27 ENCOUNTER — Encounter (HOSPITAL_BASED_OUTPATIENT_CLINIC_OR_DEPARTMENT_OTHER): Payer: Medicare HMO | Admitting: General Surgery

## 2023-10-27 DIAGNOSIS — I70222 Atherosclerosis of native arteries of extremities with rest pain, left leg: Secondary | ICD-10-CM

## 2023-10-28 ENCOUNTER — Encounter (HOSPITAL_BASED_OUTPATIENT_CLINIC_OR_DEPARTMENT_OTHER): Payer: Medicare HMO | Admitting: General Surgery

## 2023-10-31 ENCOUNTER — Encounter (HOSPITAL_BASED_OUTPATIENT_CLINIC_OR_DEPARTMENT_OTHER): Payer: Medicare HMO | Admitting: General Surgery

## 2023-10-31 ENCOUNTER — Encounter (HOSPITAL_BASED_OUTPATIENT_CLINIC_OR_DEPARTMENT_OTHER): Payer: Self-pay

## 2023-10-31 DIAGNOSIS — E11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: Secondary | ICD-10-CM | POA: Diagnosis not present

## 2023-10-31 DIAGNOSIS — E11621 Type 2 diabetes mellitus with foot ulcer: Secondary | ICD-10-CM | POA: Diagnosis not present

## 2023-10-31 DIAGNOSIS — L97522 Non-pressure chronic ulcer of other part of left foot with fat layer exposed: Secondary | ICD-10-CM | POA: Diagnosis not present

## 2023-10-31 LAB — GLUCOSE, CAPILLARY: Glucose-Capillary: 124 mg/dL — ABNORMAL HIGH (ref 70–99)

## 2023-11-01 ENCOUNTER — Other Ambulatory Visit: Payer: Self-pay | Admitting: Physician Assistant

## 2023-11-01 ENCOUNTER — Encounter (HOSPITAL_BASED_OUTPATIENT_CLINIC_OR_DEPARTMENT_OTHER): Payer: Medicare HMO | Admitting: General Surgery

## 2023-11-01 DIAGNOSIS — E11621 Type 2 diabetes mellitus with foot ulcer: Secondary | ICD-10-CM | POA: Diagnosis not present

## 2023-11-01 DIAGNOSIS — L97524 Non-pressure chronic ulcer of other part of left foot with necrosis of bone: Secondary | ICD-10-CM | POA: Diagnosis not present

## 2023-11-01 DIAGNOSIS — E11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: Secondary | ICD-10-CM | POA: Diagnosis not present

## 2023-11-01 LAB — GLUCOSE, CAPILLARY
Glucose-Capillary: 151 mg/dL — ABNORMAL HIGH (ref 70–99)
Glucose-Capillary: 98 mg/dL (ref 70–99)

## 2023-11-02 ENCOUNTER — Encounter (HOSPITAL_BASED_OUTPATIENT_CLINIC_OR_DEPARTMENT_OTHER): Payer: Medicare HMO | Admitting: General Surgery

## 2023-11-02 DIAGNOSIS — E11621 Type 2 diabetes mellitus with foot ulcer: Secondary | ICD-10-CM | POA: Diagnosis not present

## 2023-11-02 DIAGNOSIS — E11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: Secondary | ICD-10-CM | POA: Diagnosis not present

## 2023-11-02 DIAGNOSIS — L97524 Non-pressure chronic ulcer of other part of left foot with necrosis of bone: Secondary | ICD-10-CM | POA: Diagnosis not present

## 2023-11-02 LAB — GLUCOSE, CAPILLARY
Glucose-Capillary: 121 mg/dL — ABNORMAL HIGH (ref 70–99)
Glucose-Capillary: 137 mg/dL — ABNORMAL HIGH (ref 70–99)
Glucose-Capillary: 122 mg/dL — ABNORMAL HIGH (ref 70–99)

## 2023-11-03 ENCOUNTER — Encounter (HOSPITAL_BASED_OUTPATIENT_CLINIC_OR_DEPARTMENT_OTHER): Payer: Medicare HMO | Admitting: General Surgery

## 2023-11-03 DIAGNOSIS — L97524 Non-pressure chronic ulcer of other part of left foot with necrosis of bone: Secondary | ICD-10-CM | POA: Diagnosis not present

## 2023-11-03 DIAGNOSIS — E11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: Secondary | ICD-10-CM | POA: Diagnosis not present

## 2023-11-03 DIAGNOSIS — E11621 Type 2 diabetes mellitus with foot ulcer: Secondary | ICD-10-CM | POA: Diagnosis not present

## 2023-11-03 LAB — GLUCOSE, CAPILLARY
Glucose-Capillary: 122 mg/dL — ABNORMAL HIGH (ref 70–99)
Glucose-Capillary: 134 mg/dL — ABNORMAL HIGH (ref 70–99)
Glucose-Capillary: 103 mg/dL — ABNORMAL HIGH (ref 70–99)

## 2023-11-04 ENCOUNTER — Encounter (HOSPITAL_BASED_OUTPATIENT_CLINIC_OR_DEPARTMENT_OTHER): Payer: Medicare HMO | Admitting: General Surgery

## 2023-11-04 DIAGNOSIS — L97524 Non-pressure chronic ulcer of other part of left foot with necrosis of bone: Secondary | ICD-10-CM | POA: Diagnosis not present

## 2023-11-04 DIAGNOSIS — E11621 Type 2 diabetes mellitus with foot ulcer: Secondary | ICD-10-CM | POA: Diagnosis not present

## 2023-11-04 DIAGNOSIS — E11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: Secondary | ICD-10-CM | POA: Diagnosis not present

## 2023-11-04 LAB — GLUCOSE, CAPILLARY
Glucose-Capillary: 194 mg/dL — ABNORMAL HIGH (ref 70–99)
Glucose-Capillary: 110 mg/dL — ABNORMAL HIGH (ref 70–99)

## 2023-11-07 ENCOUNTER — Encounter (HOSPITAL_BASED_OUTPATIENT_CLINIC_OR_DEPARTMENT_OTHER): Payer: Medicare HMO | Attending: General Surgery | Admitting: General Surgery

## 2023-11-07 DIAGNOSIS — L97525 Non-pressure chronic ulcer of other part of left foot with muscle involvement without evidence of necrosis: Secondary | ICD-10-CM | POA: Insufficient documentation

## 2023-11-07 DIAGNOSIS — L97523 Non-pressure chronic ulcer of other part of left foot with necrosis of muscle: Secondary | ICD-10-CM | POA: Diagnosis not present

## 2023-11-07 DIAGNOSIS — E11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: Secondary | ICD-10-CM | POA: Insufficient documentation

## 2023-11-07 DIAGNOSIS — I70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: Secondary | ICD-10-CM | POA: Diagnosis not present

## 2023-11-07 DIAGNOSIS — L97524 Non-pressure chronic ulcer of other part of left foot with necrosis of bone: Secondary | ICD-10-CM | POA: Diagnosis not present

## 2023-11-07 DIAGNOSIS — E11621 Type 2 diabetes mellitus with foot ulcer: Secondary | ICD-10-CM | POA: Insufficient documentation

## 2023-11-07 LAB — GLUCOSE, CAPILLARY
Glucose-Capillary: 162 mg/dL — ABNORMAL HIGH (ref 70–99)
Glucose-Capillary: 111 mg/dL — ABNORMAL HIGH (ref 70–99)

## 2023-11-08 ENCOUNTER — Encounter (HOSPITAL_BASED_OUTPATIENT_CLINIC_OR_DEPARTMENT_OTHER): Payer: Medicare HMO | Admitting: General Surgery

## 2023-11-08 DIAGNOSIS — E11621 Type 2 diabetes mellitus with foot ulcer: Secondary | ICD-10-CM | POA: Diagnosis not present

## 2023-11-08 DIAGNOSIS — L97524 Non-pressure chronic ulcer of other part of left foot with necrosis of bone: Secondary | ICD-10-CM | POA: Diagnosis not present

## 2023-11-08 LAB — GLUCOSE, CAPILLARY
Glucose-Capillary: 129 mg/dL — ABNORMAL HIGH (ref 70–99)
Glucose-Capillary: 140 mg/dL — ABNORMAL HIGH (ref 70–99)
Glucose-Capillary: 105 mg/dL — ABNORMAL HIGH (ref 70–99)

## 2023-11-09 ENCOUNTER — Other Ambulatory Visit: Payer: Self-pay

## 2023-11-09 ENCOUNTER — Encounter (HOSPITAL_BASED_OUTPATIENT_CLINIC_OR_DEPARTMENT_OTHER): Payer: Medicare HMO | Admitting: General Surgery

## 2023-11-09 DIAGNOSIS — E11621 Type 2 diabetes mellitus with foot ulcer: Secondary | ICD-10-CM | POA: Diagnosis not present

## 2023-11-09 DIAGNOSIS — L97524 Non-pressure chronic ulcer of other part of left foot with necrosis of bone: Secondary | ICD-10-CM | POA: Diagnosis not present

## 2023-11-09 LAB — GLUCOSE, CAPILLARY
Glucose-Capillary: 150 mg/dL — ABNORMAL HIGH (ref 70–99)
Glucose-Capillary: 118 mg/dL — ABNORMAL HIGH (ref 70–99)

## 2023-11-09 MED ORDER — CLOPIDOGREL BISULFATE 75 MG PO TABS: 75.0000 mg | ORAL_TABLET | Freq: Every day | ORAL | Status: AC

## 2023-11-10 ENCOUNTER — Ambulatory Visit (HOSPITAL_BASED_OUTPATIENT_CLINIC_OR_DEPARTMENT_OTHER): Payer: Medicare HMO | Admitting: Internal Medicine

## 2023-11-10 ENCOUNTER — Encounter (HOSPITAL_BASED_OUTPATIENT_CLINIC_OR_DEPARTMENT_OTHER): Payer: Medicare HMO | Admitting: Internal Medicine

## 2023-11-11 ENCOUNTER — Encounter (HOSPITAL_BASED_OUTPATIENT_CLINIC_OR_DEPARTMENT_OTHER): Payer: Medicare HMO | Admitting: General Surgery

## 2023-11-11 DIAGNOSIS — E11621 Type 2 diabetes mellitus with foot ulcer: Secondary | ICD-10-CM | POA: Diagnosis not present

## 2023-11-11 DIAGNOSIS — L97524 Non-pressure chronic ulcer of other part of left foot with necrosis of bone: Secondary | ICD-10-CM | POA: Diagnosis not present

## 2023-11-11 LAB — GLUCOSE, CAPILLARY
Glucose-Capillary: 133 mg/dL — ABNORMAL HIGH (ref 70–99)
Glucose-Capillary: 116 mg/dL — ABNORMAL HIGH (ref 70–99)

## 2023-11-14 ENCOUNTER — Encounter (HOSPITAL_BASED_OUTPATIENT_CLINIC_OR_DEPARTMENT_OTHER): Payer: Medicare HMO | Admitting: General Surgery

## 2023-11-14 ENCOUNTER — Encounter (HOSPITAL_BASED_OUTPATIENT_CLINIC_OR_DEPARTMENT_OTHER): Admitting: General Surgery

## 2023-11-14 DIAGNOSIS — E11621 Type 2 diabetes mellitus with foot ulcer: Secondary | ICD-10-CM | POA: Diagnosis not present

## 2023-11-14 DIAGNOSIS — L97524 Non-pressure chronic ulcer of other part of left foot with necrosis of bone: Secondary | ICD-10-CM | POA: Diagnosis not present

## 2023-11-14 DIAGNOSIS — L97522 Non-pressure chronic ulcer of other part of left foot with fat layer exposed: Secondary | ICD-10-CM | POA: Diagnosis not present

## 2023-11-14 LAB — GLUCOSE, CAPILLARY
Glucose-Capillary: 122 mg/dL — ABNORMAL HIGH (ref 70–99)
Glucose-Capillary: 158 mg/dL — ABNORMAL HIGH (ref 70–99)
Glucose-Capillary: 130 mg/dL — ABNORMAL HIGH (ref 70–99)

## 2023-11-15 ENCOUNTER — Encounter (HOSPITAL_BASED_OUTPATIENT_CLINIC_OR_DEPARTMENT_OTHER): Payer: Medicare HMO | Admitting: General Surgery

## 2023-11-16 ENCOUNTER — Encounter (HOSPITAL_BASED_OUTPATIENT_CLINIC_OR_DEPARTMENT_OTHER): Admitting: Internal Medicine

## 2023-11-17 ENCOUNTER — Encounter (HOSPITAL_BASED_OUTPATIENT_CLINIC_OR_DEPARTMENT_OTHER): Admitting: Internal Medicine

## 2023-11-18 ENCOUNTER — Encounter (HOSPITAL_BASED_OUTPATIENT_CLINIC_OR_DEPARTMENT_OTHER): Payer: Medicare HMO | Admitting: Internal Medicine

## 2023-11-21 ENCOUNTER — Encounter (HOSPITAL_BASED_OUTPATIENT_CLINIC_OR_DEPARTMENT_OTHER): Admitting: Internal Medicine

## 2023-11-21 ENCOUNTER — Encounter (HOSPITAL_BASED_OUTPATIENT_CLINIC_OR_DEPARTMENT_OTHER): Payer: Medicare HMO | Admitting: Internal Medicine

## 2023-11-21 DIAGNOSIS — L97524 Non-pressure chronic ulcer of other part of left foot with necrosis of bone: Secondary | ICD-10-CM | POA: Diagnosis not present

## 2023-11-21 DIAGNOSIS — E11621 Type 2 diabetes mellitus with foot ulcer: Secondary | ICD-10-CM | POA: Diagnosis not present

## 2023-11-21 DIAGNOSIS — S91102A Unspecified open wound of left great toe without damage to nail, initial encounter: Secondary | ICD-10-CM | POA: Diagnosis not present

## 2023-11-21 DIAGNOSIS — L97522 Non-pressure chronic ulcer of other part of left foot with fat layer exposed: Secondary | ICD-10-CM | POA: Diagnosis not present

## 2023-11-21 LAB — GLUCOSE, CAPILLARY
Glucose-Capillary: 140 mg/dL — ABNORMAL HIGH (ref 70–99)
Glucose-Capillary: 118 mg/dL — ABNORMAL HIGH (ref 70–99)

## 2023-11-22 ENCOUNTER — Encounter (HOSPITAL_BASED_OUTPATIENT_CLINIC_OR_DEPARTMENT_OTHER): Payer: Medicare HMO | Admitting: General Surgery

## 2023-11-22 DIAGNOSIS — L97524 Non-pressure chronic ulcer of other part of left foot with necrosis of bone: Secondary | ICD-10-CM | POA: Diagnosis not present

## 2023-11-22 DIAGNOSIS — E11621 Type 2 diabetes mellitus with foot ulcer: Secondary | ICD-10-CM | POA: Diagnosis not present

## 2023-11-22 LAB — GLUCOSE, CAPILLARY
Glucose-Capillary: 131 mg/dL — ABNORMAL HIGH (ref 70–99)
Glucose-Capillary: 108 mg/dL — ABNORMAL HIGH (ref 70–99)

## 2023-11-23 ENCOUNTER — Encounter (HOSPITAL_BASED_OUTPATIENT_CLINIC_OR_DEPARTMENT_OTHER): Payer: Medicare HMO | Admitting: General Surgery

## 2023-11-23 DIAGNOSIS — E11621 Type 2 diabetes mellitus with foot ulcer: Secondary | ICD-10-CM | POA: Diagnosis not present

## 2023-11-23 DIAGNOSIS — L97524 Non-pressure chronic ulcer of other part of left foot with necrosis of bone: Secondary | ICD-10-CM | POA: Diagnosis not present

## 2023-11-23 LAB — GLUCOSE, CAPILLARY
Glucose-Capillary: 148 mg/dL — ABNORMAL HIGH (ref 70–99)
Glucose-Capillary: 88 mg/dL (ref 70–99)

## 2023-11-24 ENCOUNTER — Encounter (HOSPITAL_BASED_OUTPATIENT_CLINIC_OR_DEPARTMENT_OTHER): Payer: Medicare HMO | Admitting: General Surgery

## 2023-11-24 ENCOUNTER — Ambulatory Visit: Payer: Medicare HMO | Admitting: Internal Medicine

## 2023-11-24 DIAGNOSIS — E11621 Type 2 diabetes mellitus with foot ulcer: Secondary | ICD-10-CM | POA: Diagnosis not present

## 2023-11-24 DIAGNOSIS — L97524 Non-pressure chronic ulcer of other part of left foot with necrosis of bone: Secondary | ICD-10-CM | POA: Diagnosis not present

## 2023-11-24 LAB — GLUCOSE, CAPILLARY
Glucose-Capillary: 138 mg/dL — ABNORMAL HIGH (ref 70–99)
Glucose-Capillary: 118 mg/dL — ABNORMAL HIGH (ref 70–99)

## 2023-11-25 ENCOUNTER — Encounter (HOSPITAL_BASED_OUTPATIENT_CLINIC_OR_DEPARTMENT_OTHER): Payer: Medicare HMO | Admitting: General Surgery

## 2023-11-25 DIAGNOSIS — L97524 Non-pressure chronic ulcer of other part of left foot with necrosis of bone: Secondary | ICD-10-CM | POA: Diagnosis not present

## 2023-11-25 DIAGNOSIS — E11621 Type 2 diabetes mellitus with foot ulcer: Secondary | ICD-10-CM | POA: Diagnosis not present

## 2023-11-25 LAB — GLUCOSE, CAPILLARY
Glucose-Capillary: 133 mg/dL — ABNORMAL HIGH (ref 70–99)
Glucose-Capillary: 107 mg/dL — ABNORMAL HIGH (ref 70–99)

## 2023-11-28 ENCOUNTER — Encounter (HOSPITAL_BASED_OUTPATIENT_CLINIC_OR_DEPARTMENT_OTHER): Admitting: General Surgery

## 2023-11-28 ENCOUNTER — Encounter (HOSPITAL_BASED_OUTPATIENT_CLINIC_OR_DEPARTMENT_OTHER): Payer: Medicare HMO | Admitting: General Surgery

## 2023-11-28 DIAGNOSIS — S91002A Unspecified open wound, left ankle, initial encounter: Secondary | ICD-10-CM | POA: Diagnosis not present

## 2023-11-28 DIAGNOSIS — L97524 Non-pressure chronic ulcer of other part of left foot with necrosis of bone: Secondary | ICD-10-CM | POA: Diagnosis not present

## 2023-11-28 DIAGNOSIS — L97522 Non-pressure chronic ulcer of other part of left foot with fat layer exposed: Secondary | ICD-10-CM | POA: Diagnosis not present

## 2023-11-28 DIAGNOSIS — E11621 Type 2 diabetes mellitus with foot ulcer: Secondary | ICD-10-CM | POA: Diagnosis not present

## 2023-11-28 LAB — GLUCOSE, CAPILLARY
Glucose-Capillary: 145 mg/dL — ABNORMAL HIGH (ref 70–99)
Glucose-Capillary: 127 mg/dL — ABNORMAL HIGH (ref 70–99)

## 2023-11-29 ENCOUNTER — Encounter (HOSPITAL_BASED_OUTPATIENT_CLINIC_OR_DEPARTMENT_OTHER): Payer: Medicare HMO | Admitting: General Surgery

## 2023-11-29 DIAGNOSIS — L97524 Non-pressure chronic ulcer of other part of left foot with necrosis of bone: Secondary | ICD-10-CM | POA: Diagnosis not present

## 2023-11-29 DIAGNOSIS — E11621 Type 2 diabetes mellitus with foot ulcer: Secondary | ICD-10-CM | POA: Diagnosis not present

## 2023-11-29 LAB — GLUCOSE, CAPILLARY
Glucose-Capillary: 119 mg/dL — ABNORMAL HIGH (ref 70–99)
Glucose-Capillary: 140 mg/dL — ABNORMAL HIGH (ref 70–99)
Glucose-Capillary: 102 mg/dL — ABNORMAL HIGH (ref 70–99)

## 2023-11-30 ENCOUNTER — Encounter (HOSPITAL_BASED_OUTPATIENT_CLINIC_OR_DEPARTMENT_OTHER): Payer: Medicare HMO | Admitting: General Surgery

## 2023-11-30 DIAGNOSIS — L97524 Non-pressure chronic ulcer of other part of left foot with necrosis of bone: Secondary | ICD-10-CM | POA: Diagnosis not present

## 2023-11-30 DIAGNOSIS — E11621 Type 2 diabetes mellitus with foot ulcer: Secondary | ICD-10-CM | POA: Diagnosis not present

## 2023-11-30 LAB — GLUCOSE, CAPILLARY
Glucose-Capillary: 174 mg/dL — ABNORMAL HIGH (ref 70–99)
Glucose-Capillary: 130 mg/dL — ABNORMAL HIGH (ref 70–99)

## 2023-12-01 ENCOUNTER — Encounter (HOSPITAL_BASED_OUTPATIENT_CLINIC_OR_DEPARTMENT_OTHER): Payer: Medicare HMO | Admitting: General Surgery

## 2023-12-01 DIAGNOSIS — L97524 Non-pressure chronic ulcer of other part of left foot with necrosis of bone: Secondary | ICD-10-CM | POA: Diagnosis not present

## 2023-12-01 DIAGNOSIS — E11621 Type 2 diabetes mellitus with foot ulcer: Secondary | ICD-10-CM | POA: Diagnosis not present

## 2023-12-01 LAB — GLUCOSE, CAPILLARY
Glucose-Capillary: 165 mg/dL — ABNORMAL HIGH (ref 70–99)
Glucose-Capillary: 102 mg/dL — ABNORMAL HIGH (ref 70–99)

## 2023-12-02 ENCOUNTER — Encounter (HOSPITAL_BASED_OUTPATIENT_CLINIC_OR_DEPARTMENT_OTHER): Payer: Medicare HMO | Admitting: General Surgery

## 2023-12-02 DIAGNOSIS — L97524 Non-pressure chronic ulcer of other part of left foot with necrosis of bone: Secondary | ICD-10-CM | POA: Diagnosis not present

## 2023-12-02 DIAGNOSIS — E11621 Type 2 diabetes mellitus with foot ulcer: Secondary | ICD-10-CM | POA: Diagnosis not present

## 2023-12-02 LAB — GLUCOSE, CAPILLARY
Glucose-Capillary: 147 mg/dL — ABNORMAL HIGH (ref 70–99)
Glucose-Capillary: 125 mg/dL — ABNORMAL HIGH (ref 70–99)

## 2023-12-05 ENCOUNTER — Encounter (HOSPITAL_BASED_OUTPATIENT_CLINIC_OR_DEPARTMENT_OTHER): Payer: Medicare HMO | Admitting: General Surgery

## 2023-12-05 ENCOUNTER — Encounter (HOSPITAL_BASED_OUTPATIENT_CLINIC_OR_DEPARTMENT_OTHER): Admitting: General Surgery

## 2023-12-05 DIAGNOSIS — L97524 Non-pressure chronic ulcer of other part of left foot with necrosis of bone: Secondary | ICD-10-CM | POA: Diagnosis not present

## 2023-12-05 DIAGNOSIS — E11621 Type 2 diabetes mellitus with foot ulcer: Secondary | ICD-10-CM | POA: Diagnosis not present

## 2023-12-05 LAB — GLUCOSE, CAPILLARY
Glucose-Capillary: 147 mg/dL — ABNORMAL HIGH (ref 70–99)
Glucose-Capillary: 129 mg/dL — ABNORMAL HIGH (ref 70–99)
Glucose-Capillary: 106 mg/dL — ABNORMAL HIGH (ref 70–99)

## 2023-12-06 ENCOUNTER — Encounter (HOSPITAL_BASED_OUTPATIENT_CLINIC_OR_DEPARTMENT_OTHER): Attending: General Surgery | Admitting: General Surgery

## 2023-12-06 DIAGNOSIS — L97523 Non-pressure chronic ulcer of other part of left foot with necrosis of muscle: Secondary | ICD-10-CM | POA: Diagnosis not present

## 2023-12-06 DIAGNOSIS — E11621 Type 2 diabetes mellitus with foot ulcer: Secondary | ICD-10-CM | POA: Diagnosis not present

## 2023-12-06 DIAGNOSIS — I70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: Secondary | ICD-10-CM | POA: Diagnosis present

## 2023-12-06 DIAGNOSIS — E11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: Secondary | ICD-10-CM | POA: Insufficient documentation

## 2023-12-06 DIAGNOSIS — L97526 Non-pressure chronic ulcer of other part of left foot with bone involvement without evidence of necrosis: Secondary | ICD-10-CM | POA: Insufficient documentation

## 2023-12-06 DIAGNOSIS — L97524 Non-pressure chronic ulcer of other part of left foot with necrosis of bone: Secondary | ICD-10-CM | POA: Diagnosis not present

## 2023-12-06 LAB — GLUCOSE, CAPILLARY
Glucose-Capillary: 121 mg/dL — ABNORMAL HIGH (ref 70–99)
Glucose-Capillary: 130 mg/dL — ABNORMAL HIGH (ref 70–99)
Glucose-Capillary: 111 mg/dL — ABNORMAL HIGH (ref 70–99)

## 2023-12-07 ENCOUNTER — Encounter (HOSPITAL_BASED_OUTPATIENT_CLINIC_OR_DEPARTMENT_OTHER): Admitting: General Surgery

## 2023-12-07 DIAGNOSIS — I70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: Secondary | ICD-10-CM | POA: Diagnosis not present

## 2023-12-07 DIAGNOSIS — L97524 Non-pressure chronic ulcer of other part of left foot with necrosis of bone: Secondary | ICD-10-CM | POA: Diagnosis not present

## 2023-12-07 DIAGNOSIS — E11621 Type 2 diabetes mellitus with foot ulcer: Secondary | ICD-10-CM | POA: Diagnosis not present

## 2023-12-07 LAB — GLUCOSE, CAPILLARY
Glucose-Capillary: 121 mg/dL — ABNORMAL HIGH (ref 70–99)
Glucose-Capillary: 138 mg/dL — ABNORMAL HIGH (ref 70–99)
Glucose-Capillary: 99 mg/dL (ref 70–99)

## 2023-12-09 ENCOUNTER — Encounter (HOSPITAL_BASED_OUTPATIENT_CLINIC_OR_DEPARTMENT_OTHER): Admitting: General Surgery

## 2023-12-09 DIAGNOSIS — L97524 Non-pressure chronic ulcer of other part of left foot with necrosis of bone: Secondary | ICD-10-CM | POA: Diagnosis not present

## 2023-12-09 DIAGNOSIS — E11621 Type 2 diabetes mellitus with foot ulcer: Secondary | ICD-10-CM | POA: Diagnosis not present

## 2023-12-09 DIAGNOSIS — I70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: Secondary | ICD-10-CM | POA: Diagnosis not present

## 2023-12-09 LAB — GLUCOSE, CAPILLARY
Glucose-Capillary: 123 mg/dL — ABNORMAL HIGH (ref 70–99)
Glucose-Capillary: 134 mg/dL — ABNORMAL HIGH (ref 70–99)
Glucose-Capillary: 114 mg/dL — ABNORMAL HIGH (ref 70–99)

## 2023-12-12 ENCOUNTER — Encounter (HOSPITAL_BASED_OUTPATIENT_CLINIC_OR_DEPARTMENT_OTHER): Admitting: General Surgery

## 2023-12-12 DIAGNOSIS — L97522 Non-pressure chronic ulcer of other part of left foot with fat layer exposed: Secondary | ICD-10-CM | POA: Diagnosis not present

## 2023-12-12 DIAGNOSIS — I739 Peripheral vascular disease, unspecified: Secondary | ICD-10-CM | POA: Diagnosis not present

## 2023-12-12 DIAGNOSIS — E11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: Secondary | ICD-10-CM | POA: Diagnosis not present

## 2023-12-12 DIAGNOSIS — I70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: Secondary | ICD-10-CM | POA: Diagnosis not present

## 2023-12-12 DIAGNOSIS — S81802A Unspecified open wound, left lower leg, initial encounter: Secondary | ICD-10-CM | POA: Diagnosis not present

## 2023-12-12 DIAGNOSIS — L97523 Non-pressure chronic ulcer of other part of left foot with necrosis of muscle: Secondary | ICD-10-CM | POA: Diagnosis not present

## 2023-12-12 DIAGNOSIS — L97524 Non-pressure chronic ulcer of other part of left foot with necrosis of bone: Secondary | ICD-10-CM | POA: Diagnosis not present

## 2023-12-12 DIAGNOSIS — E11621 Type 2 diabetes mellitus with foot ulcer: Secondary | ICD-10-CM | POA: Diagnosis not present

## 2023-12-12 LAB — GLUCOSE, CAPILLARY
Glucose-Capillary: 142 mg/dL — ABNORMAL HIGH (ref 70–99)
Glucose-Capillary: 139 mg/dL — ABNORMAL HIGH (ref 70–99)
Glucose-Capillary: 93 mg/dL (ref 70–99)

## 2023-12-13 ENCOUNTER — Encounter (HOSPITAL_BASED_OUTPATIENT_CLINIC_OR_DEPARTMENT_OTHER): Admitting: General Surgery

## 2023-12-13 DIAGNOSIS — E11621 Type 2 diabetes mellitus with foot ulcer: Secondary | ICD-10-CM | POA: Diagnosis not present

## 2023-12-13 DIAGNOSIS — L97523 Non-pressure chronic ulcer of other part of left foot with necrosis of muscle: Secondary | ICD-10-CM | POA: Diagnosis not present

## 2023-12-13 DIAGNOSIS — L97526 Non-pressure chronic ulcer of other part of left foot with bone involvement without evidence of necrosis: Secondary | ICD-10-CM | POA: Diagnosis not present

## 2023-12-13 DIAGNOSIS — I70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: Secondary | ICD-10-CM | POA: Diagnosis not present

## 2023-12-13 LAB — GLUCOSE, CAPILLARY
Glucose-Capillary: 147 mg/dL — ABNORMAL HIGH (ref 70–99)
Glucose-Capillary: 126 mg/dL — ABNORMAL HIGH (ref 70–99)

## 2023-12-14 ENCOUNTER — Encounter (HOSPITAL_BASED_OUTPATIENT_CLINIC_OR_DEPARTMENT_OTHER): Admitting: General Surgery

## 2023-12-14 ENCOUNTER — Telehealth: Payer: Self-pay | Admitting: *Deleted

## 2023-12-14 DIAGNOSIS — L97524 Non-pressure chronic ulcer of other part of left foot with necrosis of bone: Secondary | ICD-10-CM | POA: Diagnosis not present

## 2023-12-14 DIAGNOSIS — E11621 Type 2 diabetes mellitus with foot ulcer: Secondary | ICD-10-CM | POA: Diagnosis not present

## 2023-12-14 DIAGNOSIS — I70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: Secondary | ICD-10-CM | POA: Diagnosis not present

## 2023-12-14 LAB — GLUCOSE, CAPILLARY
Glucose-Capillary: 146 mg/dL — ABNORMAL HIGH (ref 70–99)
Glucose-Capillary: 112 mg/dL — ABNORMAL HIGH (ref 70–99)

## 2023-12-14 NOTE — Telephone Encounter (Signed)
 RNCM consulted from wound care center regarding pt daily round trip costs of $80 for his wound treatments.  RNCM will reach out to transportation companies to help pt compare pricing.

## 2023-12-15 ENCOUNTER — Encounter (HOSPITAL_BASED_OUTPATIENT_CLINIC_OR_DEPARTMENT_OTHER): Admitting: General Surgery

## 2023-12-15 DIAGNOSIS — E11621 Type 2 diabetes mellitus with foot ulcer: Secondary | ICD-10-CM | POA: Diagnosis not present

## 2023-12-15 DIAGNOSIS — L97524 Non-pressure chronic ulcer of other part of left foot with necrosis of bone: Secondary | ICD-10-CM | POA: Diagnosis not present

## 2023-12-15 DIAGNOSIS — I70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: Secondary | ICD-10-CM | POA: Diagnosis not present

## 2023-12-15 LAB — GLUCOSE, CAPILLARY
Glucose-Capillary: 152 mg/dL — ABNORMAL HIGH (ref 70–99)
Glucose-Capillary: 104 mg/dL — ABNORMAL HIGH (ref 70–99)

## 2023-12-16 ENCOUNTER — Encounter (HOSPITAL_BASED_OUTPATIENT_CLINIC_OR_DEPARTMENT_OTHER): Admitting: Internal Medicine

## 2023-12-16 DIAGNOSIS — L97523 Non-pressure chronic ulcer of other part of left foot with necrosis of muscle: Secondary | ICD-10-CM

## 2023-12-16 DIAGNOSIS — E11621 Type 2 diabetes mellitus with foot ulcer: Secondary | ICD-10-CM

## 2023-12-16 DIAGNOSIS — L97526 Non-pressure chronic ulcer of other part of left foot with bone involvement without evidence of necrosis: Secondary | ICD-10-CM | POA: Diagnosis not present

## 2023-12-16 DIAGNOSIS — I70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: Secondary | ICD-10-CM | POA: Diagnosis not present

## 2023-12-16 LAB — GLUCOSE, CAPILLARY
Glucose-Capillary: 167 mg/dL — ABNORMAL HIGH (ref 70–99)
Glucose-Capillary: 104 mg/dL — ABNORMAL HIGH (ref 70–99)

## 2023-12-19 ENCOUNTER — Encounter (HOSPITAL_BASED_OUTPATIENT_CLINIC_OR_DEPARTMENT_OTHER): Admitting: General Surgery

## 2023-12-20 ENCOUNTER — Encounter (HOSPITAL_BASED_OUTPATIENT_CLINIC_OR_DEPARTMENT_OTHER): Admitting: General Surgery

## 2023-12-20 DIAGNOSIS — E11621 Type 2 diabetes mellitus with foot ulcer: Secondary | ICD-10-CM | POA: Diagnosis not present

## 2023-12-20 DIAGNOSIS — L97524 Non-pressure chronic ulcer of other part of left foot with necrosis of bone: Secondary | ICD-10-CM | POA: Diagnosis not present

## 2023-12-20 DIAGNOSIS — I70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: Secondary | ICD-10-CM | POA: Diagnosis not present

## 2023-12-20 LAB — GLUCOSE, CAPILLARY
Glucose-Capillary: 150 mg/dL — ABNORMAL HIGH (ref 70–99)
Glucose-Capillary: 103 mg/dL — ABNORMAL HIGH (ref 70–99)

## 2023-12-21 ENCOUNTER — Encounter (HOSPITAL_BASED_OUTPATIENT_CLINIC_OR_DEPARTMENT_OTHER): Admitting: General Surgery

## 2023-12-21 DIAGNOSIS — E11621 Type 2 diabetes mellitus with foot ulcer: Secondary | ICD-10-CM | POA: Diagnosis not present

## 2023-12-21 DIAGNOSIS — F419 Anxiety disorder, unspecified: Secondary | ICD-10-CM | POA: Diagnosis not present

## 2023-12-21 DIAGNOSIS — I70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: Secondary | ICD-10-CM | POA: Diagnosis not present

## 2023-12-21 DIAGNOSIS — L97524 Non-pressure chronic ulcer of other part of left foot with necrosis of bone: Secondary | ICD-10-CM | POA: Diagnosis not present

## 2023-12-21 LAB — GLUCOSE, CAPILLARY: Glucose-Capillary: 152 mg/dL — ABNORMAL HIGH (ref 70–99)

## 2023-12-22 ENCOUNTER — Encounter (HOSPITAL_BASED_OUTPATIENT_CLINIC_OR_DEPARTMENT_OTHER): Admitting: General Surgery

## 2023-12-22 DIAGNOSIS — E11621 Type 2 diabetes mellitus with foot ulcer: Secondary | ICD-10-CM | POA: Diagnosis not present

## 2023-12-22 DIAGNOSIS — I739 Peripheral vascular disease, unspecified: Secondary | ICD-10-CM | POA: Diagnosis not present

## 2023-12-22 DIAGNOSIS — S81802A Unspecified open wound, left lower leg, initial encounter: Secondary | ICD-10-CM | POA: Diagnosis not present

## 2023-12-22 DIAGNOSIS — I70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: Secondary | ICD-10-CM | POA: Diagnosis not present

## 2023-12-22 DIAGNOSIS — E11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: Secondary | ICD-10-CM | POA: Diagnosis not present

## 2023-12-22 DIAGNOSIS — L97523 Non-pressure chronic ulcer of other part of left foot with necrosis of muscle: Secondary | ICD-10-CM | POA: Diagnosis not present

## 2023-12-22 DIAGNOSIS — L97522 Non-pressure chronic ulcer of other part of left foot with fat layer exposed: Secondary | ICD-10-CM | POA: Diagnosis not present

## 2023-12-23 ENCOUNTER — Encounter (HOSPITAL_BASED_OUTPATIENT_CLINIC_OR_DEPARTMENT_OTHER): Admitting: General Surgery

## 2023-12-26 ENCOUNTER — Encounter (HOSPITAL_BASED_OUTPATIENT_CLINIC_OR_DEPARTMENT_OTHER): Admitting: General Surgery

## 2023-12-26 DIAGNOSIS — E11621 Type 2 diabetes mellitus with foot ulcer: Secondary | ICD-10-CM | POA: Diagnosis not present

## 2023-12-26 DIAGNOSIS — S81802A Unspecified open wound, left lower leg, initial encounter: Secondary | ICD-10-CM | POA: Diagnosis not present

## 2023-12-26 DIAGNOSIS — I70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: Secondary | ICD-10-CM | POA: Diagnosis not present

## 2023-12-26 DIAGNOSIS — E11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: Secondary | ICD-10-CM | POA: Diagnosis not present

## 2023-12-26 DIAGNOSIS — L97523 Non-pressure chronic ulcer of other part of left foot with necrosis of muscle: Secondary | ICD-10-CM | POA: Diagnosis not present

## 2023-12-26 DIAGNOSIS — L97522 Non-pressure chronic ulcer of other part of left foot with fat layer exposed: Secondary | ICD-10-CM | POA: Diagnosis not present

## 2023-12-26 DIAGNOSIS — I739 Peripheral vascular disease, unspecified: Secondary | ICD-10-CM | POA: Diagnosis not present

## 2023-12-27 ENCOUNTER — Encounter (HOSPITAL_BASED_OUTPATIENT_CLINIC_OR_DEPARTMENT_OTHER): Admitting: General Surgery

## 2023-12-27 DIAGNOSIS — E11621 Type 2 diabetes mellitus with foot ulcer: Secondary | ICD-10-CM | POA: Diagnosis not present

## 2023-12-27 DIAGNOSIS — I70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: Secondary | ICD-10-CM | POA: Diagnosis not present

## 2023-12-27 DIAGNOSIS — L97524 Non-pressure chronic ulcer of other part of left foot with necrosis of bone: Secondary | ICD-10-CM | POA: Diagnosis not present

## 2023-12-27 LAB — GLUCOSE, CAPILLARY
Glucose-Capillary: 141 mg/dL — ABNORMAL HIGH (ref 70–99)
Glucose-Capillary: 95 mg/dL (ref 70–99)

## 2023-12-28 ENCOUNTER — Encounter (HOSPITAL_BASED_OUTPATIENT_CLINIC_OR_DEPARTMENT_OTHER): Admitting: General Surgery

## 2023-12-28 DIAGNOSIS — L97524 Non-pressure chronic ulcer of other part of left foot with necrosis of bone: Secondary | ICD-10-CM | POA: Diagnosis not present

## 2023-12-28 DIAGNOSIS — I70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: Secondary | ICD-10-CM | POA: Diagnosis not present

## 2023-12-28 DIAGNOSIS — E11621 Type 2 diabetes mellitus with foot ulcer: Secondary | ICD-10-CM | POA: Diagnosis not present

## 2023-12-28 LAB — GLUCOSE, CAPILLARY
Glucose-Capillary: 157 mg/dL — ABNORMAL HIGH (ref 70–99)
Glucose-Capillary: 117 mg/dL — ABNORMAL HIGH (ref 70–99)

## 2023-12-29 ENCOUNTER — Encounter (HOSPITAL_BASED_OUTPATIENT_CLINIC_OR_DEPARTMENT_OTHER): Admitting: General Surgery

## 2023-12-29 DIAGNOSIS — E11621 Type 2 diabetes mellitus with foot ulcer: Secondary | ICD-10-CM | POA: Diagnosis not present

## 2023-12-29 DIAGNOSIS — L97524 Non-pressure chronic ulcer of other part of left foot with necrosis of bone: Secondary | ICD-10-CM | POA: Diagnosis not present

## 2023-12-29 DIAGNOSIS — I70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: Secondary | ICD-10-CM | POA: Diagnosis not present

## 2023-12-29 LAB — GLUCOSE, CAPILLARY
Glucose-Capillary: 128 mg/dL — ABNORMAL HIGH (ref 70–99)
Glucose-Capillary: 135 mg/dL — ABNORMAL HIGH (ref 70–99)
Glucose-Capillary: 115 mg/dL — ABNORMAL HIGH (ref 70–99)

## 2023-12-30 ENCOUNTER — Encounter (HOSPITAL_BASED_OUTPATIENT_CLINIC_OR_DEPARTMENT_OTHER): Admitting: General Surgery

## 2023-12-30 DIAGNOSIS — E11621 Type 2 diabetes mellitus with foot ulcer: Secondary | ICD-10-CM | POA: Diagnosis not present

## 2023-12-30 DIAGNOSIS — L97524 Non-pressure chronic ulcer of other part of left foot with necrosis of bone: Secondary | ICD-10-CM | POA: Diagnosis not present

## 2023-12-30 DIAGNOSIS — I70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: Secondary | ICD-10-CM | POA: Diagnosis not present

## 2023-12-30 LAB — GLUCOSE, CAPILLARY
Glucose-Capillary: 152 mg/dL — ABNORMAL HIGH (ref 70–99)
Glucose-Capillary: 108 mg/dL — ABNORMAL HIGH (ref 70–99)

## 2024-01-02 ENCOUNTER — Encounter (HOSPITAL_BASED_OUTPATIENT_CLINIC_OR_DEPARTMENT_OTHER): Admitting: General Surgery

## 2024-01-02 DIAGNOSIS — I70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: Secondary | ICD-10-CM | POA: Diagnosis not present

## 2024-01-02 DIAGNOSIS — L97524 Non-pressure chronic ulcer of other part of left foot with necrosis of bone: Secondary | ICD-10-CM | POA: Diagnosis not present

## 2024-01-02 DIAGNOSIS — E11621 Type 2 diabetes mellitus with foot ulcer: Secondary | ICD-10-CM | POA: Diagnosis not present

## 2024-01-02 LAB — GLUCOSE, CAPILLARY
Glucose-Capillary: 125 mg/dL — ABNORMAL HIGH (ref 70–99)
Glucose-Capillary: 151 mg/dL — ABNORMAL HIGH (ref 70–99)
Glucose-Capillary: 130 mg/dL — ABNORMAL HIGH (ref 70–99)

## 2024-01-03 ENCOUNTER — Encounter (HOSPITAL_BASED_OUTPATIENT_CLINIC_OR_DEPARTMENT_OTHER): Admitting: General Surgery

## 2024-01-03 LAB — GLUCOSE, CAPILLARY: Glucose-Capillary: 131 mg/dL — ABNORMAL HIGH (ref 70–99)

## 2024-01-04 ENCOUNTER — Encounter (HOSPITAL_BASED_OUTPATIENT_CLINIC_OR_DEPARTMENT_OTHER): Admitting: General Surgery

## 2024-01-05 ENCOUNTER — Encounter (HOSPITAL_BASED_OUTPATIENT_CLINIC_OR_DEPARTMENT_OTHER): Admitting: General Surgery

## 2024-01-06 ENCOUNTER — Encounter (HOSPITAL_BASED_OUTPATIENT_CLINIC_OR_DEPARTMENT_OTHER): Admitting: General Surgery

## 2024-01-09 ENCOUNTER — Encounter (HOSPITAL_BASED_OUTPATIENT_CLINIC_OR_DEPARTMENT_OTHER): Admitting: General Surgery

## 2024-01-09 DIAGNOSIS — I739 Peripheral vascular disease, unspecified: Secondary | ICD-10-CM | POA: Diagnosis not present

## 2024-01-09 DIAGNOSIS — I70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: Secondary | ICD-10-CM | POA: Diagnosis not present

## 2024-01-09 DIAGNOSIS — E11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: Secondary | ICD-10-CM | POA: Diagnosis not present

## 2024-01-09 DIAGNOSIS — S81802A Unspecified open wound, left lower leg, initial encounter: Secondary | ICD-10-CM | POA: Diagnosis not present

## 2024-01-09 DIAGNOSIS — E11621 Type 2 diabetes mellitus with foot ulcer: Secondary | ICD-10-CM | POA: Diagnosis not present

## 2024-01-09 DIAGNOSIS — L97523 Non-pressure chronic ulcer of other part of left foot with necrosis of muscle: Secondary | ICD-10-CM | POA: Diagnosis not present

## 2024-01-09 DIAGNOSIS — L97522 Non-pressure chronic ulcer of other part of left foot with fat layer exposed: Secondary | ICD-10-CM | POA: Diagnosis not present

## 2024-01-10 ENCOUNTER — Encounter (HOSPITAL_BASED_OUTPATIENT_CLINIC_OR_DEPARTMENT_OTHER): Attending: General Surgery | Admitting: General Surgery

## 2024-01-10 ENCOUNTER — Encounter (HOSPITAL_BASED_OUTPATIENT_CLINIC_OR_DEPARTMENT_OTHER): Admitting: General Surgery

## 2024-01-10 DIAGNOSIS — L97526 Non-pressure chronic ulcer of other part of left foot with bone involvement without evidence of necrosis: Secondary | ICD-10-CM | POA: Diagnosis not present

## 2024-01-10 DIAGNOSIS — E11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: Secondary | ICD-10-CM | POA: Diagnosis not present

## 2024-01-10 DIAGNOSIS — E1151 Type 2 diabetes mellitus with diabetic peripheral angiopathy without gangrene: Secondary | ICD-10-CM | POA: Insufficient documentation

## 2024-01-10 DIAGNOSIS — E11621 Type 2 diabetes mellitus with foot ulcer: Secondary | ICD-10-CM | POA: Diagnosis not present

## 2024-01-10 DIAGNOSIS — L97522 Non-pressure chronic ulcer of other part of left foot with fat layer exposed: Secondary | ICD-10-CM | POA: Diagnosis not present

## 2024-01-10 DIAGNOSIS — L97524 Non-pressure chronic ulcer of other part of left foot with necrosis of bone: Secondary | ICD-10-CM | POA: Diagnosis not present

## 2024-01-10 DIAGNOSIS — I70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: Secondary | ICD-10-CM | POA: Insufficient documentation

## 2024-01-10 LAB — GLUCOSE, CAPILLARY
Glucose-Capillary: 202 mg/dL — ABNORMAL HIGH (ref 70–99)
Glucose-Capillary: 185 mg/dL — ABNORMAL HIGH (ref 70–99)

## 2024-01-11 ENCOUNTER — Encounter (HOSPITAL_BASED_OUTPATIENT_CLINIC_OR_DEPARTMENT_OTHER): Admitting: General Surgery

## 2024-01-11 DIAGNOSIS — E11621 Type 2 diabetes mellitus with foot ulcer: Secondary | ICD-10-CM | POA: Diagnosis not present

## 2024-01-11 DIAGNOSIS — E11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: Secondary | ICD-10-CM | POA: Diagnosis not present

## 2024-01-11 DIAGNOSIS — L97524 Non-pressure chronic ulcer of other part of left foot with necrosis of bone: Secondary | ICD-10-CM | POA: Diagnosis not present

## 2024-01-11 DIAGNOSIS — I70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: Secondary | ICD-10-CM | POA: Diagnosis not present

## 2024-01-11 DIAGNOSIS — L97526 Non-pressure chronic ulcer of other part of left foot with bone involvement without evidence of necrosis: Secondary | ICD-10-CM | POA: Diagnosis not present

## 2024-01-11 DIAGNOSIS — L97522 Non-pressure chronic ulcer of other part of left foot with fat layer exposed: Secondary | ICD-10-CM | POA: Diagnosis not present

## 2024-01-11 DIAGNOSIS — E1151 Type 2 diabetes mellitus with diabetic peripheral angiopathy without gangrene: Secondary | ICD-10-CM | POA: Diagnosis not present

## 2024-01-11 LAB — GLUCOSE, CAPILLARY
Glucose-Capillary: 116 mg/dL — ABNORMAL HIGH (ref 70–99)
Glucose-Capillary: 178 mg/dL — ABNORMAL HIGH (ref 70–99)
Glucose-Capillary: 122 mg/dL — ABNORMAL HIGH (ref 70–99)

## 2024-01-12 ENCOUNTER — Encounter (HOSPITAL_BASED_OUTPATIENT_CLINIC_OR_DEPARTMENT_OTHER): Admitting: Internal Medicine

## 2024-01-12 DIAGNOSIS — E11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: Secondary | ICD-10-CM | POA: Diagnosis not present

## 2024-01-12 DIAGNOSIS — L97526 Non-pressure chronic ulcer of other part of left foot with bone involvement without evidence of necrosis: Secondary | ICD-10-CM | POA: Diagnosis not present

## 2024-01-12 DIAGNOSIS — E11621 Type 2 diabetes mellitus with foot ulcer: Secondary | ICD-10-CM | POA: Diagnosis not present

## 2024-01-12 DIAGNOSIS — L97522 Non-pressure chronic ulcer of other part of left foot with fat layer exposed: Secondary | ICD-10-CM | POA: Diagnosis not present

## 2024-01-12 DIAGNOSIS — E1151 Type 2 diabetes mellitus with diabetic peripheral angiopathy without gangrene: Secondary | ICD-10-CM | POA: Diagnosis not present

## 2024-01-12 DIAGNOSIS — I70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: Secondary | ICD-10-CM | POA: Diagnosis not present

## 2024-01-12 DIAGNOSIS — L97524 Non-pressure chronic ulcer of other part of left foot with necrosis of bone: Secondary | ICD-10-CM | POA: Diagnosis not present

## 2024-01-12 LAB — GLUCOSE, CAPILLARY
Glucose-Capillary: 189 mg/dL — ABNORMAL HIGH (ref 70–99)
Glucose-Capillary: 120 mg/dL — ABNORMAL HIGH (ref 70–99)

## 2024-01-13 ENCOUNTER — Encounter (HOSPITAL_BASED_OUTPATIENT_CLINIC_OR_DEPARTMENT_OTHER): Admitting: Internal Medicine

## 2024-01-13 DIAGNOSIS — L97522 Non-pressure chronic ulcer of other part of left foot with fat layer exposed: Secondary | ICD-10-CM | POA: Diagnosis not present

## 2024-01-13 DIAGNOSIS — E11621 Type 2 diabetes mellitus with foot ulcer: Secondary | ICD-10-CM | POA: Diagnosis not present

## 2024-01-13 DIAGNOSIS — L97526 Non-pressure chronic ulcer of other part of left foot with bone involvement without evidence of necrosis: Secondary | ICD-10-CM | POA: Diagnosis not present

## 2024-01-13 DIAGNOSIS — E11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: Secondary | ICD-10-CM | POA: Diagnosis not present

## 2024-01-13 DIAGNOSIS — I70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: Secondary | ICD-10-CM | POA: Diagnosis not present

## 2024-01-13 DIAGNOSIS — E1151 Type 2 diabetes mellitus with diabetic peripheral angiopathy without gangrene: Secondary | ICD-10-CM | POA: Diagnosis not present

## 2024-01-13 DIAGNOSIS — L97524 Non-pressure chronic ulcer of other part of left foot with necrosis of bone: Secondary | ICD-10-CM | POA: Diagnosis not present

## 2024-01-13 LAB — GLUCOSE, CAPILLARY
Glucose-Capillary: 158 mg/dL — ABNORMAL HIGH (ref 70–99)
Glucose-Capillary: 131 mg/dL — ABNORMAL HIGH (ref 70–99)

## 2024-01-16 ENCOUNTER — Encounter (HOSPITAL_BASED_OUTPATIENT_CLINIC_OR_DEPARTMENT_OTHER): Admitting: Internal Medicine

## 2024-01-16 DIAGNOSIS — L97522 Non-pressure chronic ulcer of other part of left foot with fat layer exposed: Secondary | ICD-10-CM | POA: Diagnosis not present

## 2024-01-16 DIAGNOSIS — L97526 Non-pressure chronic ulcer of other part of left foot with bone involvement without evidence of necrosis: Secondary | ICD-10-CM | POA: Diagnosis not present

## 2024-01-16 DIAGNOSIS — E1151 Type 2 diabetes mellitus with diabetic peripheral angiopathy without gangrene: Secondary | ICD-10-CM | POA: Diagnosis not present

## 2024-01-16 DIAGNOSIS — I70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: Secondary | ICD-10-CM | POA: Diagnosis not present

## 2024-01-16 DIAGNOSIS — L97524 Non-pressure chronic ulcer of other part of left foot with necrosis of bone: Secondary | ICD-10-CM | POA: Diagnosis not present

## 2024-01-16 DIAGNOSIS — E11621 Type 2 diabetes mellitus with foot ulcer: Secondary | ICD-10-CM | POA: Diagnosis not present

## 2024-01-16 DIAGNOSIS — E11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: Secondary | ICD-10-CM | POA: Diagnosis not present

## 2024-01-16 LAB — GLUCOSE, CAPILLARY
Glucose-Capillary: 162 mg/dL — ABNORMAL HIGH (ref 70–99)
Glucose-Capillary: 108 mg/dL — ABNORMAL HIGH (ref 70–99)

## 2024-01-17 ENCOUNTER — Encounter (HOSPITAL_BASED_OUTPATIENT_CLINIC_OR_DEPARTMENT_OTHER): Admitting: Internal Medicine

## 2024-01-17 DIAGNOSIS — L97522 Non-pressure chronic ulcer of other part of left foot with fat layer exposed: Secondary | ICD-10-CM | POA: Diagnosis not present

## 2024-01-17 DIAGNOSIS — L97524 Non-pressure chronic ulcer of other part of left foot with necrosis of bone: Secondary | ICD-10-CM | POA: Diagnosis not present

## 2024-01-17 DIAGNOSIS — E1151 Type 2 diabetes mellitus with diabetic peripheral angiopathy without gangrene: Secondary | ICD-10-CM | POA: Diagnosis not present

## 2024-01-17 DIAGNOSIS — E11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: Secondary | ICD-10-CM | POA: Diagnosis not present

## 2024-01-17 DIAGNOSIS — I70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: Secondary | ICD-10-CM | POA: Diagnosis not present

## 2024-01-17 DIAGNOSIS — L97526 Non-pressure chronic ulcer of other part of left foot with bone involvement without evidence of necrosis: Secondary | ICD-10-CM | POA: Diagnosis not present

## 2024-01-17 DIAGNOSIS — E11621 Type 2 diabetes mellitus with foot ulcer: Secondary | ICD-10-CM | POA: Diagnosis not present

## 2024-01-17 LAB — GLUCOSE, CAPILLARY
Glucose-Capillary: 188 mg/dL — ABNORMAL HIGH (ref 70–99)
Glucose-Capillary: 131 mg/dL — ABNORMAL HIGH (ref 70–99)

## 2024-01-18 ENCOUNTER — Encounter (HOSPITAL_BASED_OUTPATIENT_CLINIC_OR_DEPARTMENT_OTHER): Admitting: Internal Medicine

## 2024-01-18 DIAGNOSIS — L97524 Non-pressure chronic ulcer of other part of left foot with necrosis of bone: Secondary | ICD-10-CM | POA: Diagnosis not present

## 2024-01-18 DIAGNOSIS — E11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: Secondary | ICD-10-CM | POA: Diagnosis not present

## 2024-01-18 DIAGNOSIS — L97522 Non-pressure chronic ulcer of other part of left foot with fat layer exposed: Secondary | ICD-10-CM | POA: Diagnosis not present

## 2024-01-18 DIAGNOSIS — E1151 Type 2 diabetes mellitus with diabetic peripheral angiopathy without gangrene: Secondary | ICD-10-CM | POA: Diagnosis not present

## 2024-01-18 DIAGNOSIS — E11621 Type 2 diabetes mellitus with foot ulcer: Secondary | ICD-10-CM | POA: Diagnosis not present

## 2024-01-18 DIAGNOSIS — L97526 Non-pressure chronic ulcer of other part of left foot with bone involvement without evidence of necrosis: Secondary | ICD-10-CM | POA: Diagnosis not present

## 2024-01-18 DIAGNOSIS — I70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: Secondary | ICD-10-CM | POA: Diagnosis not present

## 2024-01-18 LAB — GLUCOSE, CAPILLARY
Glucose-Capillary: 163 mg/dL — ABNORMAL HIGH (ref 70–99)
Glucose-Capillary: 115 mg/dL — ABNORMAL HIGH (ref 70–99)

## 2024-01-19 ENCOUNTER — Encounter (HOSPITAL_BASED_OUTPATIENT_CLINIC_OR_DEPARTMENT_OTHER): Admitting: Internal Medicine

## 2024-01-19 DIAGNOSIS — E1151 Type 2 diabetes mellitus with diabetic peripheral angiopathy without gangrene: Secondary | ICD-10-CM | POA: Diagnosis not present

## 2024-01-19 DIAGNOSIS — L97524 Non-pressure chronic ulcer of other part of left foot with necrosis of bone: Secondary | ICD-10-CM | POA: Diagnosis not present

## 2024-01-19 DIAGNOSIS — E11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: Secondary | ICD-10-CM | POA: Diagnosis not present

## 2024-01-19 DIAGNOSIS — I70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: Secondary | ICD-10-CM | POA: Diagnosis not present

## 2024-01-19 DIAGNOSIS — L97526 Non-pressure chronic ulcer of other part of left foot with bone involvement without evidence of necrosis: Secondary | ICD-10-CM | POA: Diagnosis not present

## 2024-01-19 DIAGNOSIS — L97522 Non-pressure chronic ulcer of other part of left foot with fat layer exposed: Secondary | ICD-10-CM | POA: Diagnosis not present

## 2024-01-19 DIAGNOSIS — E11621 Type 2 diabetes mellitus with foot ulcer: Secondary | ICD-10-CM | POA: Diagnosis not present

## 2024-01-19 LAB — GLUCOSE, CAPILLARY
Glucose-Capillary: 202 mg/dL — ABNORMAL HIGH (ref 70–99)
Glucose-Capillary: 128 mg/dL — ABNORMAL HIGH (ref 70–99)

## 2024-01-20 ENCOUNTER — Encounter (HOSPITAL_BASED_OUTPATIENT_CLINIC_OR_DEPARTMENT_OTHER): Admitting: Internal Medicine

## 2024-01-20 DIAGNOSIS — E11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: Secondary | ICD-10-CM | POA: Diagnosis not present

## 2024-01-20 DIAGNOSIS — E11621 Type 2 diabetes mellitus with foot ulcer: Secondary | ICD-10-CM | POA: Diagnosis not present

## 2024-01-20 DIAGNOSIS — L97522 Non-pressure chronic ulcer of other part of left foot with fat layer exposed: Secondary | ICD-10-CM | POA: Diagnosis not present

## 2024-01-20 DIAGNOSIS — L97524 Non-pressure chronic ulcer of other part of left foot with necrosis of bone: Secondary | ICD-10-CM | POA: Diagnosis not present

## 2024-01-20 DIAGNOSIS — L97526 Non-pressure chronic ulcer of other part of left foot with bone involvement without evidence of necrosis: Secondary | ICD-10-CM | POA: Diagnosis not present

## 2024-01-20 DIAGNOSIS — I70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: Secondary | ICD-10-CM | POA: Diagnosis not present

## 2024-01-20 DIAGNOSIS — E1151 Type 2 diabetes mellitus with diabetic peripheral angiopathy without gangrene: Secondary | ICD-10-CM | POA: Diagnosis not present

## 2024-01-20 LAB — GLUCOSE, CAPILLARY
Glucose-Capillary: 147 mg/dL — ABNORMAL HIGH (ref 70–99)
Glucose-Capillary: 134 mg/dL — ABNORMAL HIGH (ref 70–99)

## 2024-01-23 ENCOUNTER — Encounter (HOSPITAL_BASED_OUTPATIENT_CLINIC_OR_DEPARTMENT_OTHER): Admitting: General Surgery

## 2024-01-23 DIAGNOSIS — T8789 Other complications of amputation stump: Secondary | ICD-10-CM | POA: Diagnosis not present

## 2024-01-23 DIAGNOSIS — I70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: Secondary | ICD-10-CM | POA: Diagnosis not present

## 2024-01-23 DIAGNOSIS — L97526 Non-pressure chronic ulcer of other part of left foot with bone involvement without evidence of necrosis: Secondary | ICD-10-CM | POA: Diagnosis not present

## 2024-01-23 DIAGNOSIS — L97522 Non-pressure chronic ulcer of other part of left foot with fat layer exposed: Secondary | ICD-10-CM | POA: Diagnosis not present

## 2024-01-23 DIAGNOSIS — L97523 Non-pressure chronic ulcer of other part of left foot with necrosis of muscle: Secondary | ICD-10-CM | POA: Diagnosis not present

## 2024-01-23 DIAGNOSIS — E11621 Type 2 diabetes mellitus with foot ulcer: Secondary | ICD-10-CM | POA: Diagnosis not present

## 2024-01-23 DIAGNOSIS — E1151 Type 2 diabetes mellitus with diabetic peripheral angiopathy without gangrene: Secondary | ICD-10-CM | POA: Diagnosis not present

## 2024-01-23 DIAGNOSIS — E11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: Secondary | ICD-10-CM | POA: Diagnosis not present

## 2024-01-23 LAB — GLUCOSE, CAPILLARY
Glucose-Capillary: 153 mg/dL — ABNORMAL HIGH (ref 70–99)
Glucose-Capillary: 102 mg/dL — ABNORMAL HIGH (ref 70–99)

## 2024-01-24 ENCOUNTER — Encounter (HOSPITAL_BASED_OUTPATIENT_CLINIC_OR_DEPARTMENT_OTHER): Admitting: General Surgery

## 2024-01-24 DIAGNOSIS — E11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: Secondary | ICD-10-CM | POA: Diagnosis not present

## 2024-01-24 DIAGNOSIS — L97526 Non-pressure chronic ulcer of other part of left foot with bone involvement without evidence of necrosis: Secondary | ICD-10-CM | POA: Diagnosis not present

## 2024-01-24 DIAGNOSIS — I70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: Secondary | ICD-10-CM | POA: Diagnosis not present

## 2024-01-24 DIAGNOSIS — L97524 Non-pressure chronic ulcer of other part of left foot with necrosis of bone: Secondary | ICD-10-CM | POA: Diagnosis not present

## 2024-01-24 DIAGNOSIS — E11621 Type 2 diabetes mellitus with foot ulcer: Secondary | ICD-10-CM | POA: Diagnosis not present

## 2024-01-24 DIAGNOSIS — E1151 Type 2 diabetes mellitus with diabetic peripheral angiopathy without gangrene: Secondary | ICD-10-CM | POA: Diagnosis not present

## 2024-01-24 DIAGNOSIS — L97522 Non-pressure chronic ulcer of other part of left foot with fat layer exposed: Secondary | ICD-10-CM | POA: Diagnosis not present

## 2024-01-24 LAB — GLUCOSE, CAPILLARY
Glucose-Capillary: 197 mg/dL — ABNORMAL HIGH (ref 70–99)
Glucose-Capillary: 127 mg/dL — ABNORMAL HIGH (ref 70–99)

## 2024-01-25 ENCOUNTER — Encounter (HOSPITAL_BASED_OUTPATIENT_CLINIC_OR_DEPARTMENT_OTHER): Admitting: General Surgery

## 2024-01-25 DIAGNOSIS — L97524 Non-pressure chronic ulcer of other part of left foot with necrosis of bone: Secondary | ICD-10-CM | POA: Diagnosis not present

## 2024-01-25 DIAGNOSIS — I70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: Secondary | ICD-10-CM | POA: Diagnosis not present

## 2024-01-25 DIAGNOSIS — L97522 Non-pressure chronic ulcer of other part of left foot with fat layer exposed: Secondary | ICD-10-CM | POA: Diagnosis not present

## 2024-01-25 DIAGNOSIS — L97526 Non-pressure chronic ulcer of other part of left foot with bone involvement without evidence of necrosis: Secondary | ICD-10-CM | POA: Diagnosis not present

## 2024-01-25 DIAGNOSIS — E1151 Type 2 diabetes mellitus with diabetic peripheral angiopathy without gangrene: Secondary | ICD-10-CM | POA: Diagnosis not present

## 2024-01-25 DIAGNOSIS — E11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: Secondary | ICD-10-CM | POA: Diagnosis not present

## 2024-01-25 DIAGNOSIS — E11621 Type 2 diabetes mellitus with foot ulcer: Secondary | ICD-10-CM | POA: Diagnosis not present

## 2024-01-25 LAB — GLUCOSE, CAPILLARY
Glucose-Capillary: 202 mg/dL — ABNORMAL HIGH (ref 70–99)
Glucose-Capillary: 124 mg/dL — ABNORMAL HIGH (ref 70–99)

## 2024-01-26 ENCOUNTER — Encounter (HOSPITAL_BASED_OUTPATIENT_CLINIC_OR_DEPARTMENT_OTHER): Admitting: General Surgery

## 2024-01-26 DIAGNOSIS — E11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: Secondary | ICD-10-CM | POA: Diagnosis not present

## 2024-01-26 DIAGNOSIS — L97524 Non-pressure chronic ulcer of other part of left foot with necrosis of bone: Secondary | ICD-10-CM | POA: Diagnosis not present

## 2024-01-26 DIAGNOSIS — E1151 Type 2 diabetes mellitus with diabetic peripheral angiopathy without gangrene: Secondary | ICD-10-CM | POA: Diagnosis not present

## 2024-01-26 DIAGNOSIS — L97522 Non-pressure chronic ulcer of other part of left foot with fat layer exposed: Secondary | ICD-10-CM | POA: Diagnosis not present

## 2024-01-26 DIAGNOSIS — I70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: Secondary | ICD-10-CM | POA: Diagnosis not present

## 2024-01-26 DIAGNOSIS — L97526 Non-pressure chronic ulcer of other part of left foot with bone involvement without evidence of necrosis: Secondary | ICD-10-CM | POA: Diagnosis not present

## 2024-01-26 DIAGNOSIS — E11621 Type 2 diabetes mellitus with foot ulcer: Secondary | ICD-10-CM | POA: Diagnosis not present

## 2024-01-26 LAB — GLUCOSE, CAPILLARY
Glucose-Capillary: 233 mg/dL — ABNORMAL HIGH (ref 70–99)
Glucose-Capillary: 124 mg/dL — ABNORMAL HIGH (ref 70–99)

## 2024-01-27 ENCOUNTER — Encounter (HOSPITAL_BASED_OUTPATIENT_CLINIC_OR_DEPARTMENT_OTHER): Admitting: Internal Medicine

## 2024-01-27 DIAGNOSIS — I70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: Secondary | ICD-10-CM | POA: Diagnosis not present

## 2024-01-27 DIAGNOSIS — E11621 Type 2 diabetes mellitus with foot ulcer: Secondary | ICD-10-CM | POA: Diagnosis not present

## 2024-01-27 DIAGNOSIS — L97526 Non-pressure chronic ulcer of other part of left foot with bone involvement without evidence of necrosis: Secondary | ICD-10-CM | POA: Diagnosis not present

## 2024-01-27 DIAGNOSIS — E11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: Secondary | ICD-10-CM | POA: Diagnosis not present

## 2024-01-27 DIAGNOSIS — L97522 Non-pressure chronic ulcer of other part of left foot with fat layer exposed: Secondary | ICD-10-CM | POA: Diagnosis not present

## 2024-01-27 DIAGNOSIS — L97523 Non-pressure chronic ulcer of other part of left foot with necrosis of muscle: Secondary | ICD-10-CM | POA: Diagnosis not present

## 2024-01-27 DIAGNOSIS — E1151 Type 2 diabetes mellitus with diabetic peripheral angiopathy without gangrene: Secondary | ICD-10-CM | POA: Diagnosis not present

## 2024-01-27 LAB — GLUCOSE, CAPILLARY
Glucose-Capillary: 218 mg/dL — ABNORMAL HIGH (ref 70–99)
Glucose-Capillary: 133 mg/dL — ABNORMAL HIGH (ref 70–99)

## 2024-01-31 ENCOUNTER — Encounter (HOSPITAL_BASED_OUTPATIENT_CLINIC_OR_DEPARTMENT_OTHER): Admitting: General Surgery

## 2024-01-31 DIAGNOSIS — L97526 Non-pressure chronic ulcer of other part of left foot with bone involvement without evidence of necrosis: Secondary | ICD-10-CM | POA: Diagnosis not present

## 2024-01-31 DIAGNOSIS — E11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: Secondary | ICD-10-CM | POA: Diagnosis not present

## 2024-01-31 DIAGNOSIS — E11621 Type 2 diabetes mellitus with foot ulcer: Secondary | ICD-10-CM | POA: Diagnosis not present

## 2024-01-31 DIAGNOSIS — E1151 Type 2 diabetes mellitus with diabetic peripheral angiopathy without gangrene: Secondary | ICD-10-CM | POA: Diagnosis not present

## 2024-01-31 DIAGNOSIS — L97522 Non-pressure chronic ulcer of other part of left foot with fat layer exposed: Secondary | ICD-10-CM | POA: Diagnosis not present

## 2024-01-31 DIAGNOSIS — L97524 Non-pressure chronic ulcer of other part of left foot with necrosis of bone: Secondary | ICD-10-CM | POA: Diagnosis not present

## 2024-01-31 DIAGNOSIS — I70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: Secondary | ICD-10-CM | POA: Diagnosis not present

## 2024-01-31 LAB — GLUCOSE, CAPILLARY: Glucose-Capillary: 167 mg/dL — ABNORMAL HIGH (ref 70–99)

## 2024-02-01 ENCOUNTER — Encounter (HOSPITAL_BASED_OUTPATIENT_CLINIC_OR_DEPARTMENT_OTHER): Admitting: General Surgery

## 2024-02-01 DIAGNOSIS — E11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: Secondary | ICD-10-CM | POA: Diagnosis not present

## 2024-02-01 DIAGNOSIS — I70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: Secondary | ICD-10-CM | POA: Diagnosis not present

## 2024-02-01 DIAGNOSIS — E11621 Type 2 diabetes mellitus with foot ulcer: Secondary | ICD-10-CM | POA: Diagnosis not present

## 2024-02-01 DIAGNOSIS — L97524 Non-pressure chronic ulcer of other part of left foot with necrosis of bone: Secondary | ICD-10-CM | POA: Diagnosis not present

## 2024-02-01 DIAGNOSIS — L97526 Non-pressure chronic ulcer of other part of left foot with bone involvement without evidence of necrosis: Secondary | ICD-10-CM | POA: Diagnosis not present

## 2024-02-01 DIAGNOSIS — E1151 Type 2 diabetes mellitus with diabetic peripheral angiopathy without gangrene: Secondary | ICD-10-CM | POA: Diagnosis not present

## 2024-02-01 DIAGNOSIS — L97522 Non-pressure chronic ulcer of other part of left foot with fat layer exposed: Secondary | ICD-10-CM | POA: Diagnosis not present

## 2024-02-01 LAB — GLUCOSE, CAPILLARY
Glucose-Capillary: 112 mg/dL — ABNORMAL HIGH (ref 70–99)
Glucose-Capillary: 180 mg/dL — ABNORMAL HIGH (ref 70–99)
Glucose-Capillary: 128 mg/dL — ABNORMAL HIGH (ref 70–99)

## 2024-02-02 ENCOUNTER — Encounter (HOSPITAL_BASED_OUTPATIENT_CLINIC_OR_DEPARTMENT_OTHER): Admitting: General Surgery

## 2024-02-02 DIAGNOSIS — E11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: Secondary | ICD-10-CM | POA: Diagnosis not present

## 2024-02-02 DIAGNOSIS — I70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: Secondary | ICD-10-CM | POA: Diagnosis not present

## 2024-02-02 DIAGNOSIS — E1151 Type 2 diabetes mellitus with diabetic peripheral angiopathy without gangrene: Secondary | ICD-10-CM | POA: Diagnosis not present

## 2024-02-02 DIAGNOSIS — L97524 Non-pressure chronic ulcer of other part of left foot with necrosis of bone: Secondary | ICD-10-CM | POA: Diagnosis not present

## 2024-02-02 DIAGNOSIS — E11621 Type 2 diabetes mellitus with foot ulcer: Secondary | ICD-10-CM | POA: Diagnosis not present

## 2024-02-02 DIAGNOSIS — L97526 Non-pressure chronic ulcer of other part of left foot with bone involvement without evidence of necrosis: Secondary | ICD-10-CM | POA: Diagnosis not present

## 2024-02-02 DIAGNOSIS — L97522 Non-pressure chronic ulcer of other part of left foot with fat layer exposed: Secondary | ICD-10-CM | POA: Diagnosis not present

## 2024-02-02 LAB — GLUCOSE, CAPILLARY
Glucose-Capillary: 208 mg/dL — ABNORMAL HIGH (ref 70–99)
Glucose-Capillary: 123 mg/dL — ABNORMAL HIGH (ref 70–99)

## 2024-02-02 NOTE — Progress Notes (Unsigned)
 Patient ID: [name] [name], male   DOB: 10-29-1950, 73 y.o.   MRN: [id number]  Reason for Consult: No chief complaint on file.   Referred by Orlena Bitters, MD  Subjective:  HPI [name] [name] is a 73 y.o. male with a history of hypertension, diabetes and severe PAD who underwent left SFA to posterior tibial bypass with reversed great saphenous vein on 06/20/2023 for left foot wounds.  He continues to be followed by the wound care center and his wound has been healing appropriately.  He remains compliant with aspirin  and Plavix   Past Medical History:  Diagnosis Date   Arthritis    Diabetes mellitus without complication (HCC)    GERD (gastroesophageal reflux disease)    Headache    Peripheral vascular disease (HCC)    Family History  Problem Relation Age of Onset   Stroke Mother    Cancer Father    Heart disease Sister    Cancer Sister    Past Surgical History:  Procedure Laterality Date   ABDOMINAL AORTOGRAM W/LOWER EXTREMITY N/A 06/08/2023   Procedure: ABDOMINAL AORTOGRAM W/LOWER EXTREMITY;  Surgeon: Kayla Part, MD;  Location: [facility] INVASIVE CV LAB;  Service: Cardiovascular;  Laterality: N/A;   APPENDECTOMY  1956   BACK SURGERY     BIOPSY  08/07/2023   Procedure: BIOPSY;  Surgeon: Daina Drum, MD;  Location: [facility] ENDOSCOPY;  Service: Gastroenterology;;   COLONOSCOPY  1997   COLONOSCOPY WITH PROPOFOL  N/A 08/09/2023   Procedure: COLONOSCOPY WITH PROPOFOL ;  Surgeon: Ace Holder, MD;  Location: [facility] ENDOSCOPY;  Service: Gastroenterology;  Laterality: N/A;   ESOPHAGOGASTRODUODENOSCOPY (EGD) WITH PROPOFOL  N/A 08/07/2023   Procedure: ESOPHAGOGASTRODUODENOSCOPY (EGD) WITH PROPOFOL ;  Surgeon: Daina Drum, MD;  Location: [facility] ENDOSCOPY;  Service: Gastroenterology;  Laterality: N/A;   FEMORAL-TIBIAL BYPASS GRAFT Left 06/14/2023   Procedure: LEFT SUPERFICIAL FEMORAL-POSTERIOR TIBIAL ARTERY BYPASS;  Surgeon: Philipp Brawn, MD;  Location: [facility] OR;  Service: Vascular;  Laterality:  Left;   HOT HEMOSTASIS N/A 08/09/2023   Procedure: HOT HEMOSTASIS (ARGON PLASMA COAGULATION/BICAP);  Surgeon: Ace Holder, MD;  Location: [facility] ENDOSCOPY;  Service: Gastroenterology;  Laterality: N/A;  APC-circumfrential   KNEE ARTHROSCOPY Left    LUMBAR LAMINECTOMY/DECOMPRESSION MICRODISCECTOMY Bilateral 10/18/2017   Procedure: Bilateral Lumbar Two-Three Lumbar Three-Four Laminotomies;  Surgeon: Elna Haggis, MD;  Location: [facility] OR;  Service: Neurosurgery;  Laterality: Bilateral;  Bilateral L2-3 L3-4 Laminotomies   NECK SURGERY  2001   PERIPHERAL VASCULAR INTERVENTION  06/08/2023   Procedure: PERIPHERAL VASCULAR INTERVENTION;  Surgeon: Kayla Part, MD;  Location: [facility] INVASIVE CV LAB;  Service: Cardiovascular;;  Lt Common Iliac   VEIN HARVEST Left 06/14/2023   Procedure: SAPHENOUS VEIN HARVEST;  Surgeon: Philipp Brawn, MD;  Location: [facility] OR;  Service: Vascular;  Laterality: Left;   WOUND DEBRIDEMENT Left 06/20/2023   Procedure: DEBRIDEMENT WOUND;  Surgeon: Philipp Brawn, MD;  Location: [facility] OR;  Service: Vascular;  Laterality: Left;    Short Social History:  Social History   Tobacco Use   Smoking status: Former    Current packs/day: 0.00    Average packs/day: 0.3 packs/day for 59.0 years (14.8 ttl pk-yrs)    Types: Cigarettes    Start date: 3    Quit date: 06/01/2023    Years since quitting: 0.6   Smokeless tobacco: Never  Substance Use Topics   Alcohol  use: Not Currently    Comment: occ    Allergies  Allergen Reactions   Gadobutrol  Hives,  Itching and Swelling    After injection of MRI contrast Gadavist  pt experienced facial itching, stuffy nose, eye swelling   Iodinated Contrast Media Hives    Current Outpatient Medications  Medication Sig Dispense Refill   amLODipine  (NORVASC ) 5 MG tablet Take 1 tablet (5 mg total) by mouth daily. 30 tablet 0   Ascorbic Acid  1000 MG TBCR Take 1,000 mg by mouth in the morning and at bedtime.     aspirin  81 MG chewable tablet Chew  1 tablet (81 mg total) by mouth daily.     atorvastatin  (LIPITOR) 40 MG tablet Take 1 tablet (40 mg total) by mouth daily. 30 tablet 1   clopidogrel  (PLAVIX ) 75 MG tablet Take 1 tablet (75 mg total) by mouth daily with breakfast.     ferrous sulfate 325 (65 FE) MG tablet Take 325 mg by mouth daily with breakfast.     fluconazole  (DIFLUCAN ) 200 MG tablet Take 2 tablets by mouth the first day then 1 tablet daily until gone 15 tablet 0   gabapentin  (NEURONTIN ) 300 MG capsule Take 300 mg by mouth 3 (three) times daily.     pantoprazole  (PROTONIX ) 40 MG tablet Take 1 tablet (40 mg total) by mouth 2 (two) times daily. 60 tablet 2   vitamin B-12 (CYANOCOBALAMIN ) 1000 MCG tablet Take 1,000 mcg by mouth daily.     vitamin E 100 UNIT capsule Take 100 Units by mouth daily.     Wound Dressings ([employer] MANUKA HONEY WOUND EX) Apply 1 Application topically as needed (for bandage changes).     Zinc  Acetate 50 MG CAPS Take 50 mg by mouth in the morning.     No current facility-administered medications for this visit.   Facility-Administered Medications Ordered in Other Visits  Medication Dose Route Frequency Provider Last Rate Last Admin   ondansetron  (ZOFRAN ) 4 mg in sodium chloride  0.9 % 50 mL IVPB  4 mg Intravenous Q6H PRN Elna Haggis, MD        REVIEW OF SYSTEMS  All other systems reviewed and are negative  Objective:  Objective   There were no vitals filed for this visit. There is no height or weight on file to calculate BMI.  Physical Exam General: no acute distress Cardiac: hemodynamically stable Pulm: normal work of breathing Neuro: alert, no focal deficit Extremities: Right foot wounds appear to be healing with granulation tissue. Vascular:   Left: Multiphasic DP PT and peroneal  Data: ABI +---------+------------------+-----+----------+--------+  Right   Rt Pressure (mmHg)IndexWaveform  Comment   +---------+------------------+-----+----------+--------+  Brachial 158                                         +---------+------------------+-----+----------+--------+  PTA                                      Absent    +---------+------------------+-----+----------+--------+  DP      107               0.68 monophasic          +---------+------------------+-----+----------+--------+  Great Toe0                 0.00 Absent              +---------+------------------+-----+----------+--------+   +--------+------------------+-----+----------+-------+  Left   Lt Pressure (  mmHg)IndexWaveform  Comment  +--------+------------------+-----+----------+-------+  [id number]                                      +--------+------------------+-----+----------+-------+  PTA    178               1.13 monophasic         +--------+------------------+-----+----------+-------+  DP     163               1.03 monophasic         +--------+------------------+-----+----------+-------+   +-------+-----------+---------------+------------+------------+  ABI/TBIToday's ABIToday's TBI    Previous ABIPrevious TBI  +-------+-----------+---------------+------------+------------+  Right 0.68       0              0.61        0.10          +-------+-----------+---------------+------------+------------+  Left  1.13       N/A Toe Habitus0.94        N/A-Bandage   +-------+-----------+---------------+------------+------------+   Duplex +----------+--------+-----+---------------+--------+--------+  LEFT     PSV cm/sRatioStenosis       WaveformComments  +----------+--------+-----+---------------+--------+--------+  CFA Prox  175          30-49% stenosisbiphasic          +----------+--------+-----+---------------+--------+--------+  DFA      164                         biphasic          +----------+--------+-----+---------------+--------+--------+  SFA Prox  134                         biphasic           +----------+--------+-----+---------------+--------+--------+  SFA Mid   85                          biphasic          +----------+--------+-----+---------------+--------+--------+  PTA Mid   190          50-74% stenosis                  +----------+--------+-----+---------------+--------+--------+  PTA Distal275          50-74% stenosis                  +----------+--------+-----+---------------+--------+--------+     Left Graft #1: Mid SFA-PTA w/ GSV  +--------------------+--------+---------------+--------+-------------+                     PSV cm/sStenosis       WaveformComments       +--------------------+--------+---------------+--------+-------------+  Inflow             85                                            +--------------------+--------+---------------+--------+-------------+  Proximal Anastomosis174     50-70% stenosis        Low end range  +--------------------+--------+---------------+--------+-------------+  Proximal Graft      95                                            +--------------------+--------+---------------+--------+-------------+  Mid Graft           75                                            +--------------------+--------+---------------+--------+-------------+  Distal Graft        71                                            +--------------------+--------+---------------+--------+-------------+  Distal Anastomosis  180     50-70% stenosis        Low end range  +--------------------+--------+---------------+--------+-------------+  Outflow            190     50-74% stenosis        Low end range  +--------------------+--------+---------------+--------+-------------+      Assessment/Plan:   [name] [name] is a 73 y.o. male with male with chronic limb threatening ischemia of left lower extremity status post left SFA to PT artery bypass with reversed great saphenous vein on  06/20/23. His ABI and duplex are stable from prior he has multiphasic signals.  His foot wounds do appear to be healing.  He should continue with the wound care center.  Continue with aspirin  and Plavix . Will plan follow-up in 3 months with repeat ABI duplex   Philipp Brawn MD [facility]

## 2024-02-03 ENCOUNTER — Encounter: Payer: Self-pay | Admitting: Vascular Surgery

## 2024-02-03 ENCOUNTER — Ambulatory Visit (HOSPITAL_COMMUNITY)
Admission: RE | Admit: 2024-02-03 | Discharge: 2024-02-03 | Disposition: A | Discharge status: Home / Self Care | Payer: Medicare HMO | Source: Ambulatory Visit | Attending: Vascular Surgery | Admitting: Vascular Surgery

## 2024-02-03 ENCOUNTER — Ambulatory Visit: Payer: Medicare HMO | Attending: Vascular Surgery | Admitting: Vascular Surgery

## 2024-02-03 VITALS — BP 145/75 | HR 69 | Temp 98.1°F | Resp 20 | Ht 69.0 in | Wt 213.0 lb

## 2024-02-03 DIAGNOSIS — I70222 Atherosclerosis of native arteries of extremities with rest pain, left leg: Secondary | ICD-10-CM | POA: Diagnosis not present

## 2024-02-03 LAB — VAS US ABI WITH/WO TBI
Left ABI: 1.13
Right ABI: 0.68

## 2024-02-06 ENCOUNTER — Encounter (HOSPITAL_BASED_OUTPATIENT_CLINIC_OR_DEPARTMENT_OTHER): Admitting: General Surgery

## 2024-02-06 ENCOUNTER — Other Ambulatory Visit: Payer: Self-pay | Admitting: *Deleted

## 2024-02-06 DIAGNOSIS — I70222 Atherosclerosis of native arteries of extremities with rest pain, left leg: Secondary | ICD-10-CM

## 2024-02-07 ENCOUNTER — Encounter (HOSPITAL_BASED_OUTPATIENT_CLINIC_OR_DEPARTMENT_OTHER): Attending: General Surgery | Admitting: General Surgery

## 2024-02-07 DIAGNOSIS — L97522 Non-pressure chronic ulcer of other part of left foot with fat layer exposed: Secondary | ICD-10-CM | POA: Diagnosis not present

## 2024-02-07 DIAGNOSIS — E11621 Type 2 diabetes mellitus with foot ulcer: Secondary | ICD-10-CM | POA: Insufficient documentation

## 2024-02-07 DIAGNOSIS — E11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: Secondary | ICD-10-CM | POA: Diagnosis not present

## 2024-02-07 DIAGNOSIS — L84 Corns and callosities: Secondary | ICD-10-CM | POA: Insufficient documentation

## 2024-02-07 DIAGNOSIS — L97524 Non-pressure chronic ulcer of other part of left foot with necrosis of bone: Secondary | ICD-10-CM | POA: Diagnosis not present

## 2024-02-07 DIAGNOSIS — I70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: Secondary | ICD-10-CM | POA: Insufficient documentation

## 2024-02-07 LAB — GLUCOSE, CAPILLARY
Glucose-Capillary: 174 mg/dL — ABNORMAL HIGH (ref 70–99)
Glucose-Capillary: 109 mg/dL — ABNORMAL HIGH (ref 70–99)

## 2024-02-08 ENCOUNTER — Encounter (HOSPITAL_BASED_OUTPATIENT_CLINIC_OR_DEPARTMENT_OTHER): Admitting: General Surgery

## 2024-02-08 DIAGNOSIS — E11621 Type 2 diabetes mellitus with foot ulcer: Secondary | ICD-10-CM | POA: Diagnosis not present

## 2024-02-08 DIAGNOSIS — L97524 Non-pressure chronic ulcer of other part of left foot with necrosis of bone: Secondary | ICD-10-CM | POA: Diagnosis not present

## 2024-02-08 LAB — GLUCOSE, CAPILLARY
Glucose-Capillary: 152 mg/dL — ABNORMAL HIGH (ref 70–99)
Glucose-Capillary: 155 mg/dL — ABNORMAL HIGH (ref 70–99)
Glucose-Capillary: 124 mg/dL — ABNORMAL HIGH (ref 70–99)

## 2024-02-09 ENCOUNTER — Encounter (HOSPITAL_BASED_OUTPATIENT_CLINIC_OR_DEPARTMENT_OTHER): Admitting: General Surgery

## 2024-02-09 ENCOUNTER — Ambulatory Visit (HOSPITAL_BASED_OUTPATIENT_CLINIC_OR_DEPARTMENT_OTHER): Admitting: General Surgery

## 2024-02-10 ENCOUNTER — Encounter (HOSPITAL_BASED_OUTPATIENT_CLINIC_OR_DEPARTMENT_OTHER): Admitting: General Surgery

## 2024-02-13 ENCOUNTER — Encounter (HOSPITAL_BASED_OUTPATIENT_CLINIC_OR_DEPARTMENT_OTHER): Admitting: General Surgery

## 2024-02-14 ENCOUNTER — Encounter (HOSPITAL_BASED_OUTPATIENT_CLINIC_OR_DEPARTMENT_OTHER): Admitting: General Surgery

## 2024-02-15 ENCOUNTER — Encounter (HOSPITAL_BASED_OUTPATIENT_CLINIC_OR_DEPARTMENT_OTHER): Admitting: General Surgery

## 2024-02-16 ENCOUNTER — Encounter (HOSPITAL_BASED_OUTPATIENT_CLINIC_OR_DEPARTMENT_OTHER): Admitting: General Surgery

## 2024-02-16 ENCOUNTER — Ambulatory Visit (HOSPITAL_BASED_OUTPATIENT_CLINIC_OR_DEPARTMENT_OTHER): Admitting: General Surgery

## 2024-02-17 ENCOUNTER — Encounter (HOSPITAL_BASED_OUTPATIENT_CLINIC_OR_DEPARTMENT_OTHER): Admitting: General Surgery

## 2024-02-20 ENCOUNTER — Encounter (HOSPITAL_BASED_OUTPATIENT_CLINIC_OR_DEPARTMENT_OTHER): Admitting: General Surgery

## 2024-02-20 DIAGNOSIS — E11621 Type 2 diabetes mellitus with foot ulcer: Secondary | ICD-10-CM | POA: Diagnosis not present

## 2024-02-20 DIAGNOSIS — L84 Corns and callosities: Secondary | ICD-10-CM | POA: Diagnosis not present

## 2024-02-20 DIAGNOSIS — Z89412 Acquired absence of left great toe: Secondary | ICD-10-CM | POA: Diagnosis not present

## 2024-02-20 DIAGNOSIS — L97522 Non-pressure chronic ulcer of other part of left foot with fat layer exposed: Secondary | ICD-10-CM | POA: Diagnosis not present

## 2024-02-20 DIAGNOSIS — T8789 Other complications of amputation stump: Secondary | ICD-10-CM | POA: Diagnosis not present

## 2024-02-20 DIAGNOSIS — E11622 Type 2 diabetes mellitus with other skin ulcer: Secondary | ICD-10-CM | POA: Diagnosis not present

## 2024-02-21 ENCOUNTER — Encounter (HOSPITAL_BASED_OUTPATIENT_CLINIC_OR_DEPARTMENT_OTHER): Admitting: General Surgery

## 2024-02-21 DIAGNOSIS — E11621 Type 2 diabetes mellitus with foot ulcer: Secondary | ICD-10-CM | POA: Diagnosis not present

## 2024-02-21 DIAGNOSIS — L97524 Non-pressure chronic ulcer of other part of left foot with necrosis of bone: Secondary | ICD-10-CM | POA: Diagnosis not present

## 2024-02-21 LAB — GLUCOSE, CAPILLARY
Glucose-Capillary: 124 mg/dL — ABNORMAL HIGH (ref 70–99)
Glucose-Capillary: 121 mg/dL — ABNORMAL HIGH (ref 70–99)
Glucose-Capillary: 124 mg/dL — ABNORMAL HIGH (ref 70–99)
Glucose-Capillary: 110 mg/dL — ABNORMAL HIGH (ref 70–99)

## 2024-02-22 ENCOUNTER — Encounter (HOSPITAL_BASED_OUTPATIENT_CLINIC_OR_DEPARTMENT_OTHER): Admitting: General Surgery

## 2024-02-22 DIAGNOSIS — L97524 Non-pressure chronic ulcer of other part of left foot with necrosis of bone: Secondary | ICD-10-CM | POA: Diagnosis not present

## 2024-02-22 DIAGNOSIS — E11621 Type 2 diabetes mellitus with foot ulcer: Secondary | ICD-10-CM | POA: Diagnosis not present

## 2024-02-22 LAB — GLUCOSE, CAPILLARY
Glucose-Capillary: 149 mg/dL — ABNORMAL HIGH (ref 70–99)
Glucose-Capillary: 107 mg/dL — ABNORMAL HIGH (ref 70–99)

## 2024-02-23 ENCOUNTER — Encounter (HOSPITAL_BASED_OUTPATIENT_CLINIC_OR_DEPARTMENT_OTHER): Admitting: General Surgery

## 2024-02-24 ENCOUNTER — Encounter (HOSPITAL_BASED_OUTPATIENT_CLINIC_OR_DEPARTMENT_OTHER): Admitting: General Surgery

## 2024-02-24 DIAGNOSIS — L97524 Non-pressure chronic ulcer of other part of left foot with necrosis of bone: Secondary | ICD-10-CM | POA: Diagnosis not present

## 2024-02-24 DIAGNOSIS — E11621 Type 2 diabetes mellitus with foot ulcer: Secondary | ICD-10-CM | POA: Diagnosis not present

## 2024-02-24 LAB — GLUCOSE, CAPILLARY
Glucose-Capillary: 199 mg/dL — ABNORMAL HIGH (ref 70–99)
Glucose-Capillary: 126 mg/dL — ABNORMAL HIGH (ref 70–99)

## 2024-02-27 ENCOUNTER — Encounter (HOSPITAL_BASED_OUTPATIENT_CLINIC_OR_DEPARTMENT_OTHER): Admitting: General Surgery

## 2024-02-27 DIAGNOSIS — E11621 Type 2 diabetes mellitus with foot ulcer: Secondary | ICD-10-CM | POA: Diagnosis not present

## 2024-02-27 DIAGNOSIS — L97524 Non-pressure chronic ulcer of other part of left foot with necrosis of bone: Secondary | ICD-10-CM | POA: Diagnosis not present

## 2024-02-27 DIAGNOSIS — L97522 Non-pressure chronic ulcer of other part of left foot with fat layer exposed: Secondary | ICD-10-CM | POA: Diagnosis not present

## 2024-02-27 DIAGNOSIS — Z89412 Acquired absence of left great toe: Secondary | ICD-10-CM | POA: Diagnosis not present

## 2024-02-27 DIAGNOSIS — T8789 Other complications of amputation stump: Secondary | ICD-10-CM | POA: Diagnosis not present

## 2024-02-27 LAB — GLUCOSE, CAPILLARY
Glucose-Capillary: 159 mg/dL — ABNORMAL HIGH (ref 70–99)
Glucose-Capillary: 137 mg/dL — ABNORMAL HIGH (ref 70–99)
Glucose-Capillary: 143 mg/dL — ABNORMAL HIGH (ref 70–99)
Glucose-Capillary: 110 mg/dL — ABNORMAL HIGH (ref 70–99)

## 2024-02-28 ENCOUNTER — Encounter (HOSPITAL_BASED_OUTPATIENT_CLINIC_OR_DEPARTMENT_OTHER): Admitting: General Surgery

## 2024-02-28 DIAGNOSIS — E11621 Type 2 diabetes mellitus with foot ulcer: Secondary | ICD-10-CM | POA: Diagnosis not present

## 2024-02-28 DIAGNOSIS — L97524 Non-pressure chronic ulcer of other part of left foot with necrosis of bone: Secondary | ICD-10-CM | POA: Diagnosis not present

## 2024-02-28 LAB — GLUCOSE, CAPILLARY
Glucose-Capillary: 157 mg/dL — ABNORMAL HIGH (ref 70–99)
Glucose-Capillary: 130 mg/dL — ABNORMAL HIGH (ref 70–99)

## 2024-02-29 ENCOUNTER — Encounter (HOSPITAL_BASED_OUTPATIENT_CLINIC_OR_DEPARTMENT_OTHER): Admitting: General Surgery

## 2024-02-29 DIAGNOSIS — E11621 Type 2 diabetes mellitus with foot ulcer: Secondary | ICD-10-CM | POA: Diagnosis not present

## 2024-02-29 DIAGNOSIS — L97524 Non-pressure chronic ulcer of other part of left foot with necrosis of bone: Secondary | ICD-10-CM | POA: Diagnosis not present

## 2024-02-29 LAB — GLUCOSE, CAPILLARY
Glucose-Capillary: 152 mg/dL — ABNORMAL HIGH (ref 70–99)
Glucose-Capillary: 121 mg/dL — ABNORMAL HIGH (ref 70–99)

## 2024-03-01 ENCOUNTER — Encounter (HOSPITAL_BASED_OUTPATIENT_CLINIC_OR_DEPARTMENT_OTHER): Admitting: General Surgery

## 2024-03-01 DIAGNOSIS — L97524 Non-pressure chronic ulcer of other part of left foot with necrosis of bone: Secondary | ICD-10-CM | POA: Diagnosis not present

## 2024-03-01 DIAGNOSIS — E11621 Type 2 diabetes mellitus with foot ulcer: Secondary | ICD-10-CM | POA: Diagnosis not present

## 2024-03-01 LAB — GLUCOSE, CAPILLARY
Glucose-Capillary: 168 mg/dL — ABNORMAL HIGH (ref 70–99)
Glucose-Capillary: 113 mg/dL — ABNORMAL HIGH (ref 70–99)

## 2024-03-02 ENCOUNTER — Encounter (HOSPITAL_BASED_OUTPATIENT_CLINIC_OR_DEPARTMENT_OTHER): Admitting: General Surgery

## 2024-03-02 DIAGNOSIS — E11621 Type 2 diabetes mellitus with foot ulcer: Secondary | ICD-10-CM | POA: Diagnosis not present

## 2024-03-02 DIAGNOSIS — L97524 Non-pressure chronic ulcer of other part of left foot with necrosis of bone: Secondary | ICD-10-CM | POA: Diagnosis not present

## 2024-03-02 LAB — GLUCOSE, CAPILLARY
Glucose-Capillary: 184 mg/dL — ABNORMAL HIGH (ref 70–99)
Glucose-Capillary: 116 mg/dL — ABNORMAL HIGH (ref 70–99)

## 2024-03-05 ENCOUNTER — Encounter (HOSPITAL_BASED_OUTPATIENT_CLINIC_OR_DEPARTMENT_OTHER): Admitting: General Surgery

## 2024-03-05 DIAGNOSIS — T8789 Other complications of amputation stump: Secondary | ICD-10-CM | POA: Diagnosis not present

## 2024-03-05 DIAGNOSIS — L97522 Non-pressure chronic ulcer of other part of left foot with fat layer exposed: Secondary | ICD-10-CM | POA: Diagnosis not present

## 2024-03-05 DIAGNOSIS — E11621 Type 2 diabetes mellitus with foot ulcer: Secondary | ICD-10-CM | POA: Diagnosis not present

## 2024-03-05 DIAGNOSIS — Z89412 Acquired absence of left great toe: Secondary | ICD-10-CM | POA: Diagnosis not present

## 2024-03-06 ENCOUNTER — Encounter (HOSPITAL_BASED_OUTPATIENT_CLINIC_OR_DEPARTMENT_OTHER): Admitting: General Surgery

## 2024-03-07 ENCOUNTER — Encounter (HOSPITAL_BASED_OUTPATIENT_CLINIC_OR_DEPARTMENT_OTHER): Admitting: General Surgery

## 2024-03-08 ENCOUNTER — Encounter (HOSPITAL_BASED_OUTPATIENT_CLINIC_OR_DEPARTMENT_OTHER): Admitting: General Surgery

## 2024-03-12 ENCOUNTER — Encounter (HOSPITAL_BASED_OUTPATIENT_CLINIC_OR_DEPARTMENT_OTHER): Attending: General Surgery | Admitting: General Surgery

## 2024-03-12 ENCOUNTER — Encounter (HOSPITAL_BASED_OUTPATIENT_CLINIC_OR_DEPARTMENT_OTHER): Admitting: General Surgery

## 2024-03-12 DIAGNOSIS — E11621 Type 2 diabetes mellitus with foot ulcer: Secondary | ICD-10-CM | POA: Insufficient documentation

## 2024-03-12 DIAGNOSIS — I739 Peripheral vascular disease, unspecified: Secondary | ICD-10-CM | POA: Diagnosis not present

## 2024-03-12 DIAGNOSIS — E11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: Secondary | ICD-10-CM | POA: Diagnosis not present

## 2024-03-12 DIAGNOSIS — I70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: Secondary | ICD-10-CM | POA: Diagnosis not present

## 2024-03-12 DIAGNOSIS — L84 Corns and callosities: Secondary | ICD-10-CM | POA: Diagnosis not present

## 2024-03-12 DIAGNOSIS — T8789 Other complications of amputation stump: Secondary | ICD-10-CM | POA: Diagnosis not present

## 2024-03-12 DIAGNOSIS — L97522 Non-pressure chronic ulcer of other part of left foot with fat layer exposed: Secondary | ICD-10-CM | POA: Insufficient documentation

## 2024-03-12 DIAGNOSIS — Z89412 Acquired absence of left great toe: Secondary | ICD-10-CM | POA: Diagnosis not present

## 2024-03-13 ENCOUNTER — Encounter (HOSPITAL_BASED_OUTPATIENT_CLINIC_OR_DEPARTMENT_OTHER): Admitting: General Surgery

## 2024-03-14 ENCOUNTER — Encounter (HOSPITAL_BASED_OUTPATIENT_CLINIC_OR_DEPARTMENT_OTHER): Admitting: General Surgery

## 2024-03-15 ENCOUNTER — Encounter (HOSPITAL_BASED_OUTPATIENT_CLINIC_OR_DEPARTMENT_OTHER): Admitting: General Surgery

## 2024-03-16 ENCOUNTER — Encounter (HOSPITAL_BASED_OUTPATIENT_CLINIC_OR_DEPARTMENT_OTHER): Admitting: General Surgery

## 2024-03-19 ENCOUNTER — Ambulatory Visit (HOSPITAL_BASED_OUTPATIENT_CLINIC_OR_DEPARTMENT_OTHER): Admitting: General Surgery

## 2024-03-21 ENCOUNTER — Encounter (HOSPITAL_BASED_OUTPATIENT_CLINIC_OR_DEPARTMENT_OTHER): Admitting: General Surgery

## 2024-03-21 DIAGNOSIS — T8789 Other complications of amputation stump: Secondary | ICD-10-CM | POA: Diagnosis not present

## 2024-03-21 DIAGNOSIS — E11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: Secondary | ICD-10-CM | POA: Diagnosis not present

## 2024-03-21 DIAGNOSIS — I739 Peripheral vascular disease, unspecified: Secondary | ICD-10-CM | POA: Diagnosis not present

## 2024-03-21 DIAGNOSIS — I70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: Secondary | ICD-10-CM | POA: Diagnosis not present

## 2024-03-21 DIAGNOSIS — E11621 Type 2 diabetes mellitus with foot ulcer: Secondary | ICD-10-CM | POA: Diagnosis not present

## 2024-03-21 DIAGNOSIS — Z89412 Acquired absence of left great toe: Secondary | ICD-10-CM | POA: Diagnosis not present

## 2024-03-21 DIAGNOSIS — L97522 Non-pressure chronic ulcer of other part of left foot with fat layer exposed: Secondary | ICD-10-CM | POA: Diagnosis not present

## 2024-03-21 DIAGNOSIS — L84 Corns and callosities: Secondary | ICD-10-CM | POA: Diagnosis not present

## 2024-03-28 ENCOUNTER — Encounter (HOSPITAL_BASED_OUTPATIENT_CLINIC_OR_DEPARTMENT_OTHER): Admitting: General Surgery

## 2024-03-28 DIAGNOSIS — L97522 Non-pressure chronic ulcer of other part of left foot with fat layer exposed: Secondary | ICD-10-CM | POA: Diagnosis not present

## 2024-03-28 DIAGNOSIS — Z89422 Acquired absence of other left toe(s): Secondary | ICD-10-CM | POA: Diagnosis not present

## 2024-03-28 DIAGNOSIS — L84 Corns and callosities: Secondary | ICD-10-CM | POA: Diagnosis not present

## 2024-03-28 DIAGNOSIS — T8789 Other complications of amputation stump: Secondary | ICD-10-CM | POA: Diagnosis not present

## 2024-03-28 DIAGNOSIS — I70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: Secondary | ICD-10-CM | POA: Diagnosis not present

## 2024-03-28 DIAGNOSIS — E11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: Secondary | ICD-10-CM | POA: Diagnosis not present

## 2024-03-28 DIAGNOSIS — I739 Peripheral vascular disease, unspecified: Secondary | ICD-10-CM | POA: Diagnosis not present

## 2024-03-28 DIAGNOSIS — E11621 Type 2 diabetes mellitus with foot ulcer: Secondary | ICD-10-CM | POA: Diagnosis not present

## 2024-04-05 ENCOUNTER — Encounter (HOSPITAL_BASED_OUTPATIENT_CLINIC_OR_DEPARTMENT_OTHER): Admitting: General Surgery

## 2024-04-05 DIAGNOSIS — I739 Peripheral vascular disease, unspecified: Secondary | ICD-10-CM | POA: Diagnosis not present

## 2024-04-05 DIAGNOSIS — T8789 Other complications of amputation stump: Secondary | ICD-10-CM | POA: Diagnosis not present

## 2024-04-05 DIAGNOSIS — E11621 Type 2 diabetes mellitus with foot ulcer: Secondary | ICD-10-CM | POA: Diagnosis not present

## 2024-04-05 DIAGNOSIS — I70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: Secondary | ICD-10-CM | POA: Diagnosis not present

## 2024-04-05 DIAGNOSIS — Z89422 Acquired absence of other left toe(s): Secondary | ICD-10-CM | POA: Diagnosis not present

## 2024-04-05 DIAGNOSIS — L97522 Non-pressure chronic ulcer of other part of left foot with fat layer exposed: Secondary | ICD-10-CM | POA: Diagnosis not present

## 2024-04-05 DIAGNOSIS — L84 Corns and callosities: Secondary | ICD-10-CM | POA: Diagnosis not present

## 2024-04-05 DIAGNOSIS — E11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: Secondary | ICD-10-CM | POA: Diagnosis not present

## 2024-04-12 ENCOUNTER — Encounter (HOSPITAL_BASED_OUTPATIENT_CLINIC_OR_DEPARTMENT_OTHER): Attending: General Surgery | Admitting: General Surgery

## 2024-04-12 DIAGNOSIS — L84 Corns and callosities: Secondary | ICD-10-CM | POA: Insufficient documentation

## 2024-04-12 DIAGNOSIS — Z872 Personal history of diseases of the skin and subcutaneous tissue: Secondary | ICD-10-CM | POA: Insufficient documentation

## 2024-04-12 DIAGNOSIS — E11621 Type 2 diabetes mellitus with foot ulcer: Secondary | ICD-10-CM | POA: Diagnosis not present

## 2024-04-12 DIAGNOSIS — Z09 Encounter for follow-up examination after completed treatment for conditions other than malignant neoplasm: Secondary | ICD-10-CM | POA: Diagnosis not present

## 2024-04-12 DIAGNOSIS — Z8631 Personal history of diabetic foot ulcer: Secondary | ICD-10-CM | POA: Diagnosis not present

## 2024-04-12 DIAGNOSIS — E1151 Type 2 diabetes mellitus with diabetic peripheral angiopathy without gangrene: Secondary | ICD-10-CM | POA: Diagnosis not present

## 2024-04-12 DIAGNOSIS — Z89422 Acquired absence of other left toe(s): Secondary | ICD-10-CM | POA: Diagnosis not present

## 2024-04-12 DIAGNOSIS — I70202 Unspecified atherosclerosis of native arteries of extremities, left leg: Secondary | ICD-10-CM | POA: Diagnosis not present

## 2024-04-12 DIAGNOSIS — L97522 Non-pressure chronic ulcer of other part of left foot with fat layer exposed: Secondary | ICD-10-CM | POA: Diagnosis not present

## 2024-04-12 DIAGNOSIS — T8789 Other complications of amputation stump: Secondary | ICD-10-CM | POA: Diagnosis not present

## 2024-04-25 NOTE — Progress Notes (Deleted)
 Patient ID: [name] [name], male   DOB: [birth year], 73 y.o.   MRN: [id number]  Reason for Consult: No chief complaint on file.   Referred by [name] Leta NOVAK, MD  Subjective:     HPI BABY STAIRS is a 73 y.o. male presenting for follow-up.  He has a history of severe PAD and underwent a left SFA to posterior tibial bypass with reverse great saphenous vein on 06/20/2023 for left foot wounds.  I last saw him 3 months ago and he was noted to have a stable ABI and duplex.  We plan to continue with dual antiplatelet and continue follow-up with wound care center.  He was last seen by the wound care center on 03/13/2024 and was noted to have complete of epithelialization of his wounds.  Past Medical History:  Diagnosis Date   Arthritis    Diabetes mellitus without complication (HCC)    GERD (gastroesophageal reflux disease)    Headache    Hyperlipidemia    Hypertension    Peripheral vascular disease (HCC)    Family History  Problem Relation Age of Onset   Stroke Mother    Cancer Father    Heart disease Sister    Cancer Sister    Past Surgical History:  Procedure Laterality Date   ABDOMINAL AORTOGRAM W/LOWER EXTREMITY N/A 06/08/2023   Procedure: ABDOMINAL AORTOGRAM W/LOWER EXTREMITY;  Surgeon: Lanis Fonda BRAVO, MD;  Location: [facility] INVASIVE CV LAB;  Service: Cardiovascular;  Laterality: N/A;   APPENDECTOMY  1956   BACK SURGERY     BIOPSY  08/07/2023   Procedure: BIOPSY;  Surgeon: Federico Rosario BROCKS, MD;  Location: [facility] ENDOSCOPY;  Service: Gastroenterology;;   COLONOSCOPY  1997   COLONOSCOPY WITH PROPOFOL  N/A 08/09/2023   Procedure: COLONOSCOPY WITH PROPOFOL ;  Surgeon: Leigh Elspeth SQUIBB, MD;  Location: [facility] ENDOSCOPY;  Service: Gastroenterology;  Laterality: N/A;   ESOPHAGOGASTRODUODENOSCOPY (EGD) WITH PROPOFOL  N/A 08/07/2023   Procedure: ESOPHAGOGASTRODUODENOSCOPY (EGD) WITH PROPOFOL ;  Surgeon: Federico Rosario BROCKS, MD;  Location: [facility] ENDOSCOPY;  Service: Gastroenterology;  Laterality: N/A;    FEMORAL-TIBIAL BYPASS GRAFT Left 06/14/2023   Procedure: LEFT SUPERFICIAL FEMORAL-POSTERIOR TIBIAL ARTERY BYPASS;  Surgeon: Pearline Norman RAMAN, MD;  Location: [facility] OR;  Service: Vascular;  Laterality: Left;   HOT HEMOSTASIS N/A 08/09/2023   Procedure: HOT HEMOSTASIS (ARGON PLASMA COAGULATION/BICAP);  Surgeon: Leigh Elspeth SQUIBB, MD;  Location: [facility] ENDOSCOPY;  Service: Gastroenterology;  Laterality: N/A;  APC-circumfrential   KNEE ARTHROSCOPY Left    LUMBAR LAMINECTOMY/DECOMPRESSION MICRODISCECTOMY Bilateral 10/18/2017   Procedure: Bilateral Lumbar Two-Three Lumbar Three-Four Laminotomies;  Surgeon: Colon Shove, MD;  Location: [facility] OR;  Service: Neurosurgery;  Laterality: Bilateral;  Bilateral L2-3 L3-4 Laminotomies   NECK SURGERY  2001   PERIPHERAL VASCULAR INTERVENTION  06/08/2023   Procedure: PERIPHERAL VASCULAR INTERVENTION;  Surgeon: Lanis Fonda BRAVO, MD;  Location: [facility] INVASIVE CV LAB;  Service: Cardiovascular;;  Lt Common Iliac   VEIN HARVEST Left 06/14/2023   Procedure: SAPHENOUS VEIN HARVEST;  Surgeon: Pearline Norman RAMAN, MD;  Location: [facility] OR;  Service: Vascular;  Laterality: Left;   WOUND DEBRIDEMENT Left 06/20/2023   Procedure: DEBRIDEMENT WOUND;  Surgeon: Pearline Norman RAMAN, MD;  Location: [facility] OR;  Service: Vascular;  Laterality: Left;    Short Social History:  Social History   Tobacco Use   Smoking status: Former    Current packs/day: 0.00    Average packs/day: 0.3 packs/day for 59.0 years (14.8 ttl pk-yrs)    Types: Cigarettes    Start date:  25    Quit date: 06/01/2023    Years since quitting: 0.9   Smokeless tobacco: Never  Substance Use Topics   Alcohol  use: Not Currently    Comment: occ    Allergies  Allergen Reactions   Gadobutrol  Hives, Itching and Swelling    After injection of MRI contrast Gadavist  pt experienced facial itching, stuffy nose, eye swelling   Iodinated Contrast Media Hives    Current Outpatient Medications  Medication Sig Dispense Refill   amLODipine   (NORVASC ) 5 MG tablet Take 1 tablet (5 mg total) by mouth daily. 30 tablet 0   Ascorbic Acid  1000 MG TBCR Take 1,000 mg by mouth in the morning and at bedtime.     aspirin  81 MG chewable tablet Chew 1 tablet (81 mg total) by mouth daily.     atorvastatin  (LIPITOR) 40 MG tablet Take 1 tablet (40 mg total) by mouth daily. 30 tablet 1   clopidogrel  (PLAVIX ) 75 MG tablet Take 1 tablet (75 mg total) by mouth daily with breakfast.     ferrous sulfate 325 (65 FE) MG tablet Take 325 mg by mouth daily with breakfast.     fluconazole  (DIFLUCAN ) 200 MG tablet Take 2 tablets by mouth the first day then 1 tablet daily until gone 15 tablet 0   gabapentin  (NEURONTIN ) 300 MG capsule Take 300 mg by mouth 3 (three) times daily.     pantoprazole  (PROTONIX ) 40 MG tablet Take 1 tablet (40 mg total) by mouth 2 (two) times daily. 60 tablet 2   vitamin B-12 (CYANOCOBALAMIN ) 1000 MCG tablet Take 1,000 mcg by mouth daily.     vitamin E 100 UNIT capsule Take 100 Units by mouth daily.     Wound Dressings ([employer] MANUKA HONEY WOUND EX) Apply 1 Application topically as needed (for bandage changes).     Zinc  Acetate 50 MG CAPS Take 50 mg by mouth in the morning.     No current facility-administered medications for this visit.   Facility-Administered Medications Ordered in Other Visits  Medication Dose Route Frequency Provider Last Rate Last Admin   ondansetron  (ZOFRAN ) 4 mg in sodium chloride  0.9 % 50 mL IVPB  4 mg Intravenous Q6H PRN Colon Shove, MD        REVIEW OF SYSTEMS  All other systems were reviewed and are negative     Objective:  Objective   There were no vitals filed for this visit. There is no height or weight on file to calculate BMI.  Physical Exam General: no acute distress Cardiac: hemodynamically stable Pulm: normal work of breathing Neuro: alert, no focal deficit Extremities: no edema, cyanosis or wounds*** Vascular:   Right: ***  Left: ***  Data: ABI *** Previously: Right 0.68 toe  pressure 0, left 1.1 toe pressure 163  Duplex *** Previously with approximately 50% stenoses of the proximal and distal anastomoses with PSV 174-180 Mid graft previously in the 70s Distal PT stenosis noted to be 50 to 74% with a PSV of 275     Assessment/Plan:   [name] [name] is a 73 y.o. 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  His ABI and duplex today ***.  His wounds of healed.  Although his toe pressures 0 on the right he continues to be asymptomatic.  Will plan to continue to monitor and will follow-up in 3 months with a repeat ABI  and duplex.  That would be about around his 1 year postop and if stable at that point in asymptomatic we will plan to extend follow-up to every 6 months.  Recommendations to optimize cardiovascular risk: Abstinence from all tobacco products. Blood glucose control with goal A1c < 7%. Blood pressure control with goal blood pressure < 140/90 mmHg. Lipid reduction therapy with goal LDL-C <100 mg/dL  Aspirin  81mg  PO QD.  Atorvastatin  40-80mg  PO QD (or other high intensity statin therapy).   Norman GORMAN Serve MD [facility]

## 2024-04-27 ENCOUNTER — Ambulatory Visit: Admitting: Vascular Surgery

## 2024-04-27 ENCOUNTER — Encounter (HOSPITAL_COMMUNITY)

## 2024-04-27 ENCOUNTER — Ambulatory Visit (HOSPITAL_COMMUNITY)

## 2024-05-31 ENCOUNTER — Encounter: Payer: Self-pay | Admitting: Neurological Surgery

## 2024-06-06 NOTE — Progress Notes (Signed)
 [name] [name]                                          MRN: [id number]   06/06/2024   The [facility] Quality Team Specialist reviewed this patient medical record for the purposes of chart review for care gap closure. The following were reviewed: chart review for care gap closure-glycemic status assessment.    [facility] Quality Team

## 2024-10-08 ENCOUNTER — Encounter: Payer: Self-pay | Admitting: *Deleted

## 2024-10-08 NOTE — Progress Notes (Signed)
 [name] [name]                                          MRN: [id number]   10/08/2024   The [facility] Quality Team Specialist reviewed this patient medical record for the purposes of chart review for care gap closure. The following were reviewed: chart review for care gap closure-glycemic status assessment.    [facility] Quality Team
